# Patient Record
Sex: Male | Born: 1960
Health system: Southern US, Community
[De-identification: ages and names within clinical notes are randomized; demographics above are authoritative.]

## PROBLEM LIST (undated history)

## (undated) ENCOUNTER — Emergency Department: Discharge status: Absent without leave | Payer: Self-pay

## (undated) ENCOUNTER — Ambulatory Visit (INDEPENDENT_AMBULATORY_CARE_PROVIDER_SITE_OTHER): Admission: RE | Payer: Self-pay

## (undated) DIAGNOSIS — M1611 Unilateral primary osteoarthritis, right hip: Secondary | ICD-10-CM

## (undated) DIAGNOSIS — N429 Disorder of prostate, unspecified: Secondary | ICD-10-CM

## (undated) DIAGNOSIS — Z92241 Personal history of systemic steroid therapy: Secondary | ICD-10-CM

## (undated) HISTORY — DX: Personal history of systemic steroid therapy: Z92.241

## (undated) HISTORY — PX: ROTATOR CUFF REPAIR: SHX139

## (undated) HISTORY — DX: Unilateral primary osteoarthritis, right hip: M16.11

---

## 1988-02-14 HISTORY — PX: SPINAL FUSION: SHX223

## 1994-04-11 ENCOUNTER — Emergency Department: Admit: 1994-04-11 | Payer: Self-pay | Admitting: Emergency Medicine

## 1994-06-12 ENCOUNTER — Ambulatory Visit
Admit: 1994-06-12 | Disposition: A | Discharge status: Home / Self Care | Payer: Self-pay | Source: Ambulatory Visit | Admitting: Neurological Surgery

## 1994-06-29 ENCOUNTER — Ambulatory Visit: Admission: RE | Admit: 1994-06-29 | Payer: Self-pay | Source: Ambulatory Visit | Admitting: Neurological Surgery

## 1994-08-25 ENCOUNTER — Emergency Department: Admit: 1994-08-25 | Payer: Self-pay

## 1998-02-04 ENCOUNTER — Emergency Department: Admit: 1998-02-04 | Payer: Self-pay | Admitting: Emergency Medicine

## 2002-11-10 ENCOUNTER — Emergency Department: Admit: 2002-11-10 | Payer: Self-pay | Source: Emergency Department

## 2002-12-03 ENCOUNTER — Ambulatory Visit: Admission: RE | Admit: 2002-12-03 | Payer: Self-pay | Source: Ambulatory Visit | Admitting: Orthopaedic Surgery

## 2004-11-21 ENCOUNTER — Emergency Department: Admit: 2004-11-21 | Payer: Self-pay | Source: Emergency Department | Admitting: Emergency Medicine

## 2005-12-28 ENCOUNTER — Ambulatory Visit
Admit: 2005-12-28 | Disposition: A | Discharge status: Home / Self Care | Payer: Self-pay | Source: Ambulatory Visit | Admitting: Orthopaedic Surgery

## 2006-02-08 ENCOUNTER — Ambulatory Visit: Admission: RE | Admit: 2006-02-08 | Payer: Self-pay | Source: Ambulatory Visit | Admitting: Orthopaedic Surgery

## 2006-02-13 DIAGNOSIS — I10 Essential (primary) hypertension: Secondary | ICD-10-CM

## 2006-02-13 HISTORY — DX: Essential (primary) hypertension: I10

## 2006-05-10 ENCOUNTER — Ambulatory Visit: Admission: RE | Admit: 2006-05-10 | Payer: Self-pay | Source: Ambulatory Visit | Admitting: Orthopaedic Surgery

## 2006-06-08 ENCOUNTER — Ambulatory Visit: Admission: RE | Admit: 2006-06-08 | Payer: Self-pay | Source: Ambulatory Visit | Admitting: Orthopaedic Surgery

## 2007-05-20 ENCOUNTER — Emergency Department: Admit: 2007-05-20 | Payer: Self-pay | Source: Emergency Department | Admitting: Emergency Medicine

## 2008-08-19 ENCOUNTER — Ambulatory Visit
Admit: 2008-08-19 | Disposition: A | Discharge status: Home / Self Care | Payer: Self-pay | Source: Ambulatory Visit | Admitting: Orthopaedic Surgery

## 2008-08-20 ENCOUNTER — Emergency Department: Admit: 2008-08-20 | Payer: Self-pay | Source: Emergency Department | Admitting: Emergency Medicine

## 2008-08-20 LAB — URINALYSIS WITH MICROSCOPIC
Bilirubin, UA: NEGATIVE
Blood, UA: NEGATIVE
Glucose, UA: NEGATIVE
Ketones UA: NEGATIVE
Leukocyte Esterase, UA: NEGATIVE
Nitrite, UA: NEGATIVE
RBC, UA: 1 /HPF (ref 0–3)
Specific Gravity UA POCT: 1.021 (ref 1.001–1.035)
Squamous Epithelial Cells, Urine: <=1 /HPF
Urine pH: 6.5 (ref 5.0–8.0)
Urobilinogen, UA: NORMAL mg/dL
WBC, UA: 1 /HPF (ref 0–5)

## 2010-02-13 HISTORY — PX: SPLENECTOMY: SUR1306

## 2010-07-31 ENCOUNTER — Inpatient Hospital Stay
Admission: EM | Admit: 2010-07-31 | Discharge: 2010-08-05 | Disposition: A | Discharge status: Home / Self Care | Payer: Self-pay | Source: Emergency Department | Attending: Surgery | Admitting: Surgery

## 2010-07-31 LAB — CBC
Hematocrit: 43.7 % (ref 42.0–52.0)
Hgb: 15.2 g/dL (ref 13.0–17.0)
MCH: 31.9 pg (ref 28.0–32.0)
MCHC: 34.8 g/dL (ref 32.0–36.0)
MCV: 91.6 fL (ref 80.0–100.0)
MPV: 10 fL (ref 9.4–12.3)
Nucleated RBC: 0 /100 WBC
Platelets: 167 10*3/uL (ref 140–400)
RBC: 4.77 10*6/uL (ref 4.70–6.00)
RDW: 13 % (ref 12–15)
WBC: 25.78 10*3/uL — ABNORMAL HIGH (ref 3.50–10.80)

## 2010-07-31 LAB — HEMOGLOBIN AND HEMATOCRIT, BLOOD
Hematocrit: 42.1 % (ref 42.0–52.0)
Hematocrit: 39.3 % — ABNORMAL LOW (ref 42.0–52.0)
Hematocrit: 40.3 % — ABNORMAL LOW (ref 42.0–52.0)
Hgb: 14.4 g/dL (ref 13.0–17.0)
Hgb: 13.3 g/dL (ref 13.0–17.0)
Hgb: 13.4 g/dL (ref 13.0–17.0)

## 2010-07-31 LAB — BASIC METABOLIC PANEL
BUN: 25 mg/dL — ABNORMAL HIGH (ref 8–20)
CO2: 20 mEq/L — ABNORMAL LOW (ref 21–30)
Calcium: 8.3 mg/dL — ABNORMAL LOW (ref 8.6–10.2)
Chloride: 105 mEq/L (ref 98–107)
Creatinine: 1.3 mg/dL (ref 0.6–1.5)
Glucose: 101 mg/dL — ABNORMAL HIGH (ref 70–100)
Potassium: 4.6 mEq/L (ref 3.6–5.0)
Sodium: 139 mEq/L (ref 136–146)

## 2010-07-31 LAB — TYPE AND SCREEN
AB Screen Gel: NEGATIVE
ABO Rh: O POS

## 2010-07-31 LAB — GFR: EGFR: 59

## 2010-08-01 LAB — HEMOGLOBIN AND HEMATOCRIT, BLOOD
Hematocrit: 38.5 % — ABNORMAL LOW (ref 42.0–52.0)
Hematocrit: 36.7 % — ABNORMAL LOW (ref 42.0–52.0)
Hgb: 12.7 g/dL — ABNORMAL LOW (ref 13.0–17.0)
Hgb: 12 g/dL — ABNORMAL LOW (ref 13.0–17.0)

## 2010-08-01 LAB — MRSA CULTURE

## 2010-08-02 LAB — CBC
Hematocrit: 30.7 % — ABNORMAL LOW (ref 42.0–52.0)
Hgb: 10.2 g/dL — ABNORMAL LOW (ref 13.0–17.0)
MCH: 31.4 pg (ref 28.0–32.0)
MCHC: 33.2 g/dL (ref 32.0–36.0)
MCV: 94.5 fL (ref 80.0–100.0)
MPV: 10.2 fL (ref 9.4–12.3)
Nucleated RBC: 0 /100 WBC
Platelets: 93 10*3/uL — ABNORMAL LOW (ref 140–400)
RBC: 3.25 10*6/uL — ABNORMAL LOW (ref 4.70–6.00)
RDW: 13 % (ref 12–15)
WBC: 11.99 10*3/uL — ABNORMAL HIGH (ref 3.50–10.80)

## 2010-08-03 LAB — CBC
Hematocrit: 30.3 % — ABNORMAL LOW (ref 42.0–52.0)
Hgb: 10.2 g/dL — ABNORMAL LOW (ref 13.0–17.0)
MCH: 31.5 pg (ref 28.0–32.0)
MCHC: 33.7 g/dL (ref 32.0–36.0)
MCV: 93.5 fL (ref 80.0–100.0)
MPV: 10.2 fL (ref 9.4–12.3)
Nucleated RBC: 0 /100 WBC
Platelets: 100 10*3/uL — ABNORMAL LOW (ref 140–400)
RBC: 3.24 10*6/uL — ABNORMAL LOW (ref 4.70–6.00)
RDW: 13 % (ref 12–15)
WBC: 13.48 10*3/uL — ABNORMAL HIGH (ref 3.50–10.80)

## 2010-08-06 ENCOUNTER — Ambulatory Visit: Payer: Self-pay

## 2010-08-06 ENCOUNTER — Inpatient Hospital Stay
Admission: EM | Admit: 2010-08-06 | Discharge: 2010-08-12 | Disposition: A | Discharge status: Home / Self Care | Payer: Self-pay | Source: Emergency Department | Attending: Surgery | Admitting: Surgery

## 2010-08-06 LAB — CBC AND DIFFERENTIAL
Hematocrit: 26.6 % — ABNORMAL LOW (ref 42.0–52.0)
Hgb: 8.8 g/dL — ABNORMAL LOW (ref 13.0–17.0)
MCH: 31.3 pg (ref 28.0–32.0)
MCHC: 33.1 g/dL (ref 32.0–36.0)
MCV: 94.7 fL (ref 80.0–100.0)
MPV: 11.7 fL (ref 9.4–12.3)
Platelets: 215 10*3/uL (ref 140–400)
RBC: 2.81 10*6/uL — ABNORMAL LOW (ref 4.70–6.00)
RDW: 13 % (ref 12–15)
WBC: 34.47 10*3/uL — ABNORMAL HIGH (ref 3.50–10.80)

## 2010-08-06 LAB — I-STAT CG4 VENOUS CARTRIDGE
Lactic Acid I-Stat: 1.7 mEq/L (ref 0.5–2.2)
i-STAT Base Excess Venous: 2 mEq/L
i-STAT FIO2: 23
i-STAT HCO3 Bicarbonate Venous: 27.5 mEq/L
i-STAT O2 Saturation Venous: 44 %
i-STAT Patient Temperature: 95.1
i-STAT Total CO2 Venous: 29 mEq/L
i-STAT pCO2 Venous: 45.6 mmHg
i-STAT pH Venous: 7.38
i-STAT pO2 Venous: 23 mmHg

## 2010-08-06 LAB — CBC
Hematocrit: 20.5 % — ABNORMAL LOW (ref 42.0–52.0)
Hgb: 7.2 g/dL — ABNORMAL LOW (ref 13.0–17.0)
MCH: 32.3 pg — ABNORMAL HIGH (ref 28.0–32.0)
MCHC: 35.1 g/dL (ref 32.0–36.0)
MCV: 91.9 fL (ref 80.0–100.0)
MPV: 10.2 fL (ref 9.4–12.3)
Nucleated RBC: 0 /100 WBC
Platelets: 203 10*3/uL (ref 140–400)
RBC: 2.23 10*6/uL — ABNORMAL LOW (ref 4.70–6.00)
RDW: 13 % (ref 12–15)
WBC: 21.83 10*3/uL — ABNORMAL HIGH (ref 3.50–10.80)

## 2010-08-06 LAB — MAN DIFF ONLY
Band Neutrophils Absolute: 1.03 10*3/uL — ABNORMAL HIGH (ref 0.00–1.00)
Band Neutrophils: 3 % (ref 0–9)
Basophils Absolute Manual: 0 10*3/uL (ref 0.00–0.20)
Basophils Manual: 0 % (ref 0–2)
Eosinophils Absolute Manual: 0 10*3/uL (ref 0.00–0.70)
Eosinophils Manual: 0 % (ref 0–5)
Lymphocytes Absolute Manual: 0.69 10*3/uL (ref 0.50–4.40)
Lymphocytes Manual: 2 % — ABNORMAL LOW (ref 15–41)
Monocytes Absolute: 1.72 10*3/uL — ABNORMAL HIGH (ref 0.00–1.20)
Monocytes Manual: 5 % (ref 1–11)
Neutrophils Absolute Manual: 31.02 10*3/uL — ABNORMAL HIGH (ref 1.80–8.10)
Nucleated RBC: 0 /100 WBC
Segmented Neutrophils: 90 % — ABNORMAL HIGH (ref 52–75)

## 2010-08-06 LAB — RED BLOOD COUNT
RBC Leukoreduced: TRANSFUSED
Red Blood Cells: TRANSFUSED

## 2010-08-06 LAB — TYPE AND SCREEN
AB Screen Gel: NEGATIVE
ABO Rh: O POS

## 2010-08-06 LAB — CELL MORPHOLOGY: Cell Morphology: ABNORMAL — AB

## 2010-08-06 LAB — GFR: EGFR: 50

## 2010-08-06 LAB — PT AND APTT F/C
PT INR: 1.2 — ABNORMAL HIGH (ref 0.9–1.1)
PT: 15.1 s — ABNORMAL HIGH (ref 12.6–15.0)
PTT: 32 s (ref 23–37)

## 2010-08-06 LAB — BASIC METABOLIC PANEL
BUN: 25 mg/dL — ABNORMAL HIGH (ref 8–20)
CO2: 26 mEq/L (ref 21–30)
Calcium: 7.5 mg/dL — ABNORMAL LOW (ref 8.6–10.2)
Chloride: 98 mEq/L (ref 98–107)
Creatinine: 1.5 mg/dL (ref 0.6–1.5)
Glucose: 140 mg/dL — ABNORMAL HIGH (ref 70–100)
Potassium: 4.6 mEq/L (ref 3.6–5.0)
Sodium: 133 mEq/L — ABNORMAL LOW (ref 136–146)

## 2010-08-07 LAB — BASIC METABOLIC PANEL
BUN: 26 mg/dL — ABNORMAL HIGH (ref 8–20)
CO2: 24 mEq/L (ref 21–30)
Calcium: 7.1 mg/dL — ABNORMAL LOW (ref 8.6–10.2)
Chloride: 106 mEq/L (ref 98–107)
Creatinine: 1 mg/dL (ref 0.6–1.5)
Glucose: 101 mg/dL — ABNORMAL HIGH (ref 70–100)
Potassium: 4.9 mEq/L (ref 3.6–5.0)
Sodium: 134 mEq/L — ABNORMAL LOW (ref 136–146)

## 2010-08-07 LAB — CBC
Hematocrit: 30.7 % — ABNORMAL LOW (ref 42.0–52.0)
Hematocrit: 30.6 % — ABNORMAL LOW (ref 42.0–52.0)
Hematocrit: 30.4 % — ABNORMAL LOW (ref 42.0–52.0)
Hgb: 10.3 g/dL — ABNORMAL LOW (ref 13.0–17.0)
Hgb: 10.6 g/dL — ABNORMAL LOW (ref 13.0–17.0)
Hgb: 10.3 g/dL — ABNORMAL LOW (ref 13.0–17.0)
MCH: 30.4 pg (ref 28.0–32.0)
MCH: 30.3 pg (ref 28.0–32.0)
MCH: 29.9 pg (ref 28.0–32.0)
MCHC: 33.6 g/dL (ref 32.0–36.0)
MCHC: 34.6 g/dL (ref 32.0–36.0)
MCHC: 33.9 g/dL (ref 32.0–36.0)
MCV: 90.6 fL (ref 80.0–100.0)
MCV: 87.4 fL (ref 80.0–100.0)
MCV: 88.1 fL (ref 80.0–100.0)
MPV: 11.1 fL (ref 9.4–12.3)
MPV: 10.6 fL (ref 9.4–12.3)
MPV: 10.4 fL (ref 9.4–12.3)
Nucleated RBC: 0 /100 WBC
Nucleated RBC: 0 /100 WBC
Platelets: 204 10*3/uL (ref 140–400)
Platelets: 217 10*3/uL (ref 140–400)
Platelets: 269 10*3/uL (ref 140–400)
RBC: 3.39 10*6/uL — ABNORMAL LOW (ref 4.70–6.00)
RBC: 3.5 10*6/uL — ABNORMAL LOW (ref 4.70–6.00)
RBC: 3.45 10*6/uL — ABNORMAL LOW (ref 4.70–6.00)
RDW: 14 % (ref 12–15)
RDW: 15 % (ref 12–15)
RDW: 15 % (ref 12–15)
WBC: 33.94 10*3/uL — ABNORMAL HIGH (ref 3.50–10.80)
WBC: 25.66 10*3/uL — ABNORMAL HIGH (ref 3.50–10.80)
WBC: 26 10*3/uL — ABNORMAL HIGH (ref 3.50–10.80)

## 2010-08-07 LAB — PT AND APTT F/C
PT INR: 1.2 — ABNORMAL HIGH (ref 0.9–1.1)
PT: 15 s (ref 12.6–15.0)
PTT: 30 s (ref 23–37)

## 2010-08-07 LAB — PHOSPHORUS: Phosphorus: 3.8 mg/dL (ref 2.5–4.5)

## 2010-08-07 LAB — MAGNESIUM: Magnesium: 1.9 mg/dL (ref 1.6–2.3)

## 2010-08-07 LAB — GFR: EGFR: >60

## 2010-08-07 LAB — MRSA CULTURE

## 2010-08-08 LAB — RED BLOOD CELLS OR HOLD

## 2010-08-08 LAB — CBC
Hematocrit: 27.7 % — ABNORMAL LOW (ref 42.0–52.0)
Hgb: 9.4 g/dL — ABNORMAL LOW (ref 13.0–17.0)
MCH: 30.3 pg (ref 28.0–32.0)
MCHC: 33.9 g/dL (ref 32.0–36.0)
MCV: 89.4 fL (ref 80.0–100.0)
MPV: 10.4 fL (ref 9.4–12.3)
Nucleated RBC: 1 /100 WBC
Platelets: 340 10*3/uL (ref 140–400)
RBC: 3.1 10*6/uL — ABNORMAL LOW (ref 4.70–6.00)
RDW: 15 % (ref 12–15)
WBC: 27.75 10*3/uL — ABNORMAL HIGH (ref 3.50–10.80)

## 2010-08-08 LAB — BASIC METABOLIC PANEL
BUN: 23 mg/dL — ABNORMAL HIGH (ref 8–20)
CO2: 27 mEq/L (ref 21–30)
Calcium: 7.4 mg/dL — ABNORMAL LOW (ref 8.6–10.2)
Chloride: 103 mEq/L (ref 98–107)
Creatinine: 0.8 mg/dL (ref 0.6–1.5)
Glucose: 102 mg/dL — ABNORMAL HIGH (ref 70–100)
Potassium: 4.4 mEq/L (ref 3.6–5.0)
Sodium: 135 mEq/L — ABNORMAL LOW (ref 136–146)

## 2010-08-08 LAB — GFR: EGFR: >60

## 2010-08-09 LAB — BASIC METABOLIC PANEL
BUN: 22 mg/dL — ABNORMAL HIGH (ref 8–20)
CO2: 29 mEq/L (ref 21–30)
Calcium: 7.6 mg/dL — ABNORMAL LOW (ref 8.6–10.2)
Chloride: 104 mEq/L (ref 98–107)
Creatinine: 0.9 mg/dL (ref 0.6–1.5)
Glucose: 83 mg/dL (ref 70–100)
Potassium: 4.4 mEq/L (ref 3.6–5.0)
Sodium: 139 mEq/L (ref 136–146)

## 2010-08-09 LAB — MAGNESIUM: Magnesium: 2.3 mg/dL (ref 1.6–2.3)

## 2010-08-09 LAB — CBC
Hematocrit: 27.4 % — ABNORMAL LOW (ref 42.0–52.0)
Hgb: 9 g/dL — ABNORMAL LOW (ref 13.0–17.0)
MCH: 30.3 pg (ref 28.0–32.0)
MCHC: 32.8 g/dL (ref 32.0–36.0)
MCV: 92.3 fL (ref 80.0–100.0)
MPV: 10 fL (ref 9.4–12.3)
Nucleated RBC: 0 /100 WBC
Platelets: 482 10*3/uL — ABNORMAL HIGH (ref 140–400)
RBC: 2.97 10*6/uL — ABNORMAL LOW (ref 4.70–6.00)
RDW: 15 % (ref 12–15)
WBC: 24.71 10*3/uL — ABNORMAL HIGH (ref 3.50–10.80)

## 2010-08-09 LAB — PHOSPHORUS: Phosphorus: 3.5 mg/dL (ref 2.5–4.5)

## 2010-08-09 LAB — GFR: EGFR: >60

## 2010-08-10 LAB — CBC
Hematocrit: 26.5 % — ABNORMAL LOW (ref 42.0–52.0)
Hgb: 8.7 g/dL — ABNORMAL LOW (ref 13.0–17.0)
MCH: 30.1 pg (ref 28.0–32.0)
MCHC: 32.8 g/dL (ref 32.0–36.0)
MCV: 91.7 fL (ref 80.0–100.0)
MPV: 9.9 fL (ref 9.4–12.3)
Nucleated RBC: 1 /100 WBC
Platelets: 615 10*3/uL — ABNORMAL HIGH (ref 140–400)
RBC: 2.89 10*6/uL — ABNORMAL LOW (ref 4.70–6.00)
RDW: 15 % (ref 12–15)
WBC: 20.59 10*3/uL — ABNORMAL HIGH (ref 3.50–10.80)

## 2010-08-10 LAB — BASIC METABOLIC PANEL
BUN: 15 mg/dL (ref 8–20)
CO2: 25 mEq/L (ref 21–30)
Calcium: 7.7 mg/dL — ABNORMAL LOW (ref 8.6–10.2)
Chloride: 104 mEq/L (ref 98–107)
Creatinine: 0.9 mg/dL (ref 0.6–1.5)
Glucose: 88 mg/dL (ref 70–100)
Potassium: 4.6 mEq/L (ref 3.6–5.0)
Sodium: 137 mEq/L (ref 136–146)

## 2010-08-10 LAB — GFR: EGFR: >60

## 2010-08-15 ENCOUNTER — Emergency Department
Admit: 2010-08-15 | Disposition: A | Discharge status: Home / Self Care | Payer: Self-pay | Source: Emergency Department | Admitting: Emergency Medicine

## 2010-08-25 ENCOUNTER — Ambulatory Visit (INDEPENDENT_AMBULATORY_CARE_PROVIDER_SITE_OTHER): Admit: 2010-08-25 | Discharge: 2010-08-25 | Payer: Self-pay | Source: Ambulatory Visit

## 2010-12-01 NOTE — Op Note (Unsigned)
DATE OF BIRTH:                        13-May-1960      ADMISSION DATE:                     06/08/2006            PATIENT LOCATION:                     [id number]            DATE OF PROCEDURE:                   06/08/2006      SURGEON:                            Daniel Smiles, MD      ASSISTANT(S):                         Jens Som, PA                  PREOPERATIVE DIAGNOSIS:  FAILED ROTATOR CUFF REPAIR WITH INFECTION DUE TO      NEW INJURY.  ADHESIVE CAPSULITIS, RIGHT SHOULDER.  ABSCESS, RIGHT      SHOULDER.            POSTOPERATIVE DIAGNOSIS:  FAILED ROTATOR CUFF REPAIR WITH INFECTION DUE TO      NEW INJURY.  ADHESIVE CAPSULITIS, RIGHT SHOULDER.  ABSCESS, RIGHT      SHOULDER.            PROCEDURE:  INCISION, DRAINAGE, AND MANIPULATION, RIGHT SHOULDER.      DEBRIDEMENT REPAIR OF ROTATOR CUFF, RIGHT SHOULDER.            INDICATIONS FOR PROCEDURE:  Prior to the procedure the risks, benefits, and      alternatives had been discussed.  The patient understood that his repeat      injury while using a chain saw and working in the woods within one week of      his initial procedure caused another injury, failure of his repair, abscess      formation, and hematoma formation.  A very careful discussion with his      girlfriend reveals that this gentleman may have bipolar disorder.  This may      be the reason for his being so aggressive with his motion.  This was a new      injury causing failure of the repair done.  The details are outlined      below.            [name] assisted through all aspects of the procedure.  His help      was absolutely mandatory and greatly appreciated.  He was a help with      positioning the patient, holding the arm, and helping with repair.  It      would have been impossible to achieve a successful outcome without his      help.            DESCRIPTION OF PROCEDURE:  The patient was placed supine on the operating      table.  The right shoulder was noted to be extremely stiff.   Manipulation      revealed audible and palpable lysis of  adhesions.            The right shoulder was approached through an anterior incision site.  Upon      entering the skin, a large amount of pustular drainage was identified.      This went all the way down into the shoulder joint.            This had been drained several times in the office and it reaccumulated.      The repair of the layer of the deltoid muscle had failed due to a large      hematoma and pus formation, as did the repair of the rotator cuff itself.      All sutures were broken and had failed and pulled free.            These were all debrided and copious amounts of pustular material were      cleaned from the shoulder.  This was presently treated by antibiotics.      Several deep cultures were sent.  The wounds were copiously irrigated with      multiple amounts of irrigation and Bacitracin using 3 liters.  All areas      were debrided to a clean surface.            The rotator cuff was torn free.  It was felt that repair of the cuff would      be prudent as it was retracting.  A drill hole was placed in the humerus.      All areas appeared clear.  New instruments were utilized.  Vertical      mattress sutures were used after using a 6.5 absorbable anchor to      reapproximate the shoulder.  A nice repair was obtained again.  This was      held against a clean debrided bed.  The biceps tendon had not lost its      position.            Multiple sutures were not placed in the biceps on this occasion as it was      felt this was stable enough to stay.  This could be compromised as the      repair may be compromised, as the family was advised.  The wounds were      copiously irrigated and reapproximated with #1 Vicryl suture.  Then Vicryl      and 2-0 nylon suture were placed in the skin.  The patient was very      carefully advised to be compliant.            The girlfriend was counseled extensively; she lives with the patient.  She      states he  has probable bipolar disorder.  He had sought medical help last      evening.  Compliance is key.  She is advised to contact psychiatry      immediately for followup.  She will make further arrangements.  This has      already been started at the level of the medical physician she sought help      with last evening.  She understands the possible complications and      potential failure of this procedure should he be noncompliant.  There is a      point where even with everything being done correctly he could fail and      lose function of his shoulder.  This could  even be life-threatening. The      result could be a nonfunctioning shoulder that would neither either fusion      or replacement.  Certainly his lifting would be significantly compromised.      He is advised to be careful for at least 3 months and hopefully he will      follow this advice.            The patient's family will help coordinate this.  He will also follow up      with his medical physician over the weekend.  He was given steroid coverage      because of his use of anabolic steroids.  This may also complicate the      issue in terms of healing.                                          ___________________________________          Date Signed: __________      Daniel Smiles, MD  ([id number])            D: 06/08/2006 by Daniel Smiles, MD      T: 06/10/2006 by [name] ([id number]) ([id number])      cc:  Daniel Smiles, MD

## 2010-12-01 NOTE — Op Note (Unsigned)
DATE OF BIRTH:                        Oct 11, 1960      ADMISSION DATE:                     05/10/2006            PATIENT LOCATION:                     [id number]            DATE OF PROCEDURE:                   05/10/2006      SURGEON:                            Glynis Smiles, MD      ASSISTANT(S):                  PREOPERATIVE DIAGNOSIS:  INTERNAL DERANGEMENT OF THE RIGHT SHOULDER WITH      IMPINGEMENT SYNDROME, ANTERIOR CRUCIATE ARTHRITIS, ROTATOR CUFF TEAR,      BICEPS TENDONITIS RIGHT SHOULDER.            POSTOPERATIVE DIAGNOSIS:  INTERNAL DERANGEMENT OF THE RIGHT SHOULDER WITH      IMPINGEMENT SYNDROME, ANTERIOR CRUCIATE ARTHRITIS, ROTATOR CUFF TEAR,      BICEPS TENDONITIS RIGHT SHOULDER.            PROCEDURE:  MANIPULATION FOR ADHESIVE CAPSULITIS, ACROMIOPLASTY, PARTIAL      CORTICAL RESECTION, DEBRIDEMENT AND REPAIR OF ROTATOR CUFF, BICEPS      TENODESIS RIGHT SHOULDER.            NOTE:  Prior to the procedure the risks, benefits and alternative had been      discussed with the patient.  The patient had suffered rupture of his right      pectoralis muscle in the past.  This was being treated conservatively      without repair.  He is very active, lifting and is a poorly compliant      patient that has been advised as to the need for care, especially with this      procedure.  Understanding that the pectoralis will not be repaired, the      rotator cuff will be repaired as outlined below.            Mr. Casimiro Needle Early assisted through all aspects of the procedure.  His help      was absolutely mandatory and greatly appreciated. He helped to allow this      to be done minimally invasive approach.  Details are outlined below.            DESCRIPTION OF PROCEDURE:  The patient was placed in the semisitting      position with the right shoulder prepped and draped free.  Range of motion      of the shoulder revealed adhesive capsulitis in the extreme of abduction      and external rotation and manipulation revealed  restoration of motion.            Incision was made over the [facility] joint.  Dissection was carried down to an      obvious AC separation with meniscal pathology and erosion of the distal      clavicle.  More than 1  cm of the distal clavicle was resected with an air      saw.            Severe impingement was noted at the level of the acromion on the right      side.  It was difficult to even pass a malleable retractor under the      acromion.  A large bursa was released upon entering into this region.  An      electrocautery unit was used to release soft tissues and air saw used to      decompress and perform acromioplasty which was extensive.            After smoothing the areas and irrigating, bone wax was placed onto the      bone.            A very large rotator cuff tear was identified.  Initially it was felt that      an anchor may be necessary but end-to-end restoration was achievable.  The      biceps tendon was also present in the tear and was tenodesed using multiple      figure-of-eight #1 Ethibond sutures for the repair and the tenodesis.  Once      all was nicely restored and water tight closure effected, the wounds were      irrigated and closed with figure-of-eight #1 Vicryl suture, inverted      interrupted 0 and #1 Vicryl suture and 3-0 Vicryl subcuticular suture with      Mastisol and Steri-Strips.  The shoulder was injected with 20 mL 0.5%      Marcaine with epinephrine.  No complications encountered.                                          ___________________________________          Date Signed: __________      Glynis Smiles, MD  ([id number])            D: 05/10/2006 by Glynis Smiles, MD      T: 05/11/2006 by [id number] ([id number]) ([id number])      cc:  Glynis Smiles, MD

## 2010-12-01 NOTE — Op Note (Unsigned)
DATE OF BIRTH:                        Feb 11, 1961      ADMISSION DATE:                     02/08/2006            PATIENT LOCATION:                     [id number]            DATE OF PROCEDURE:                   02/08/2006      SURGEON:                            Glynis Smiles, MD      ASSISTANT(S):                         Jens Som, PA                  PREOPERATIVE DIAGNOSES:  IMPINGEMENT SYNDROME, ADHESIVE CAPSULITIS,      ACROMIOCLAVICULAR ARTHRITIS, ROTATOR CUFF TEAR, BICEPS TENDINITIS, LEFT      SHOULDER.            POSTOPERATIVE DIAGNOSES:  IMPINGEMENT SYNDROME, ADHESIVE CAPSULITIS,      ACROMIOCLAVICULAR ARTHRITIS, ROTATOR CUFF TEAR, BICEPS TENDINITIS, LEFT      SHOULDER.            PROCEDURES:  MANIPULATION UNDER ANESTHESIA, ARTHROTOMY, RESECTION DISTAL      CLAVICLE, ACROMIOPLASTY, DEBRIDEMENT, REPAIR OF ROTATOR CUFF, BICEPS      TENODESIS, LEFT SHOULDER.            INDICATIONS:   Prior to the procedure, the risks, benefits and alternatives      had been discussed extensively with this patient.  Initially, he was      approaching his right shoulder as it has similar findings, but his left      shoulder got so much worse that we proceeded.  This actually was indicative      of the identified extensive rotator cuff tear outlined below.            [name] assisted through all aspects of the procedure.  It was a      mini open procedure and his help was absolutely mandatory and greatly      appreciated to achieve a successful outcome.  Details are outlined below.      He helped through all phases of the procedure.            DESCRIPTION OF PROCEDURE:  The patient was placed in the semi-sitting      position and prepped and draped free using Betadine scrub and paint.      Anesthesia covered him with appropriate coverage with antibiotics etc.      Details are found in their summary.            The left shoulder was manipulated under general anesthesia and palpable and      audible lysis of adhesions were  noted.  Full motion was restored.            Arthrotomy was performed over the [facility] joint.  Marked arthritis and      destructive  changes in the distal clavicle were identified.  More than 1 cm      was resected with an air saw and smoothed with a rasp.            Electrocautery was used to control bleeding.  Soft tissues were elevated      superiorly and anteriorly and a significant impingement was noted on the      acromion, impinging into the rotator cuff.  A large portion of the acromion      was removed with an air saw and smoothed with a rasp.  This was debrided      extensively and caused significant impingement.  A large bursa was entered      and debrided.  A large rotator cuff tear was identified and biceps tendon      was involved along its margin with some calcific changes.  This was      incorporated in the repair and multiple sutures placed to help hold it in      its groove.  This still may rupture distal to this and the patient      understands this.            The edges were debrided and multiple sutures placed.  Anchors were not      necessary and multiple sutures provided a closure of the cuff.  It appeared      to be reasonably stable with range of motion.  Wounds were irrigated and      closed using #1 figure-of-eight Vicryl suture for anterior soft tissues.      Inverted, interrupted #1 0-Vicryl, 2-0 Vicryl and 3-0 Vicryl for layers and      closed up the subcuticular closure with 3-0 Vicryl.  The shoulder was      injected with 20 mL of 0.5% Marcaine with epinephrine.  A dry dressing      applied.  Steri-Strips applied.  The patient returned to the recovery room      in stable condition without complication.                                                ___________________________________          Date Signed: __________      Glynis Smiles, MD  ([id number])            D: 02/08/2006 by Glynis Smiles, MD      T: 02/09/2006 by [id number] ([id number]) ([id number])      cc:  Glynis Smiles, MD
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
## 2012-04-10 ENCOUNTER — Encounter (INDEPENDENT_AMBULATORY_CARE_PROVIDER_SITE_OTHER): Payer: Self-pay

## 2012-07-25 ENCOUNTER — Emergency Department: Payer: No Typology Code available for payment source

## 2012-07-25 ENCOUNTER — Emergency Department: Payer: BC Managed Care – PPO

## 2012-07-25 ENCOUNTER — Emergency Department
Admission: EM | Admit: 2012-07-25 | Discharge: 2012-07-25 | Disposition: A | Discharge status: Home / Self Care | Payer: No Typology Code available for payment source | Attending: Emergency Medicine | Admitting: Emergency Medicine

## 2012-07-25 DIAGNOSIS — I10 Essential (primary) hypertension: Secondary | ICD-10-CM | POA: Insufficient documentation

## 2012-07-25 DIAGNOSIS — S8010XA Contusion of unspecified lower leg, initial encounter: Secondary | ICD-10-CM | POA: Insufficient documentation

## 2012-07-25 MED ORDER — OXYCODONE-ACETAMINOPHEN 5-325 MG PO TABS
1.0000 | ORAL_TABLET | Freq: Once | ORAL | Status: AC
Start: 2012-07-25 — End: 2012-07-25
  Administered 2012-07-25: 1 via ORAL
  Filled 2012-07-25: qty 1

## 2012-07-25 MED ORDER — OXYCODONE-ACETAMINOPHEN 5-325 MG PO TABS
ORAL_TABLET | ORAL | Status: DC
Start: 2012-07-25 — End: 2012-07-30

## 2012-07-25 NOTE — Discharge Instructions (Signed)
Hematoma    You have been diagnosed with a hematoma.    A hematoma is a collection of blood under the skin usually caused by injury to the area. In other words, it is a very large bruise.    Use ice to the area 15 minutes out of every hour to help with swelling and pain. By applying ice to the affected area, swelling and pain can be reduced. Place some ice cubes in a resealable (Ziploc) bag and add some water. Put a thin washcloth between the bag and the skin. Apply the ice bag to the area for at least 20 minutes. Do this at least 4 times per day. Longer times and more frequently are OK. NEVER APPLY ICE DIRECTLY TO THE SKIN! If your injury is on your hand or foot, elevate it above the level of your heart to help with swelling.    YOU SHOULD SEEK MEDICAL ATTENTION IMMEDIATELY, EITHER HERE OR AT THE NEAREST EMERGENCY DEPARTMENT, IF ANY OF THE FOLLOWING OCCURS:   The hematoma continues to get bigger.   Fever (temperature of over 100.5 F.) or shaking chills.   Redness that surrounds or spreads outward from the area.   Breakdown of the skin in the area affected.   Foul drainage from the injured area.
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
## 2012-07-30 ENCOUNTER — Emergency Department: Payer: No Typology Code available for payment source

## 2012-07-30 ENCOUNTER — Inpatient Hospital Stay
Admission: RE | Admit: 2012-07-30 | Discharge: 2012-08-03 | DRG: 603 | Disposition: A | Discharge status: Home / Self Care | Payer: No Typology Code available for payment source | Source: Ambulatory Visit | Attending: Internal Medicine | Admitting: Internal Medicine

## 2012-07-30 ENCOUNTER — Inpatient Hospital Stay: Payer: No Typology Code available for payment source | Admitting: Internal Medicine

## 2012-07-30 ENCOUNTER — Emergency Department
Admission: EM | Admit: 2012-07-30 | Discharge: 2012-07-30 | Disposition: A | Discharge status: Other Acute Inpatient Hospital | Payer: No Typology Code available for payment source | Source: Home / Self Care | Attending: Emergency Medicine | Admitting: Emergency Medicine

## 2012-07-30 DIAGNOSIS — Z79899 Other long term (current) drug therapy: Secondary | ICD-10-CM

## 2012-07-30 DIAGNOSIS — S8010XA Contusion of unspecified lower leg, initial encounter: Secondary | ICD-10-CM | POA: Diagnosis present

## 2012-07-30 DIAGNOSIS — I10 Essential (primary) hypertension: Secondary | ICD-10-CM | POA: Diagnosis present

## 2012-07-30 DIAGNOSIS — Z981 Arthrodesis status: Secondary | ICD-10-CM

## 2012-07-30 DIAGNOSIS — L02419 Cutaneous abscess of limb, unspecified: Principal | ICD-10-CM | POA: Diagnosis present

## 2012-07-30 DIAGNOSIS — F17200 Nicotine dependence, unspecified, uncomplicated: Secondary | ICD-10-CM | POA: Diagnosis present

## 2012-07-30 LAB — CBC AND DIFFERENTIAL
Basophils Absolute Automated: 0.04 (ref 0.00–0.20)
Basophils Automated: 0 %
Eosinophils Absolute Automated: 0.23 (ref 0.00–0.70)
Eosinophils Automated: 2 %
Hematocrit: 47.1 % (ref 42.0–52.0)
Hgb: 15.8 g/dL (ref 13.0–17.0)
Lymphocytes Absolute Automated: 1.37 (ref 0.50–4.40)
Lymphocytes Automated: 12 %
MCH: 32.4 pg — ABNORMAL HIGH (ref 28.0–32.0)
MCHC: 33.5 g/dL (ref 32.0–36.0)
MCV: 96.5 fL (ref 80.0–100.0)
MPV: 9.8 fL (ref 9.4–12.3)
Monocytes Absolute Automated: 1.12 (ref 0.00–1.20)
Monocytes: 10 %
Neutrophils Absolute: 8.26 — ABNORMAL HIGH (ref 1.80–8.10)
Neutrophils: 75 %
Platelets: 294 (ref 140–400)
RBC: 4.88 (ref 4.70–6.00)
RDW: 16 % — ABNORMAL HIGH (ref 12–15)
WBC: 11.02 — ABNORMAL HIGH (ref 3.50–10.80)

## 2012-07-30 LAB — BASIC METABOLIC PANEL
Anion Gap: 10 (ref 5.0–15.0)
BUN: 14 mg/dL (ref 9.0–21.0)
CO2: 25 (ref 22–29)
Calcium: 9.3 mg/dL (ref 8.5–10.5)
Chloride: 101 (ref 98–107)
Creatinine: 1.2 mg/dL (ref 0.7–1.3)
Glucose: 92 mg/dL (ref 70–100)
Potassium: 4.2 (ref 3.5–5.1)
Sodium: 136 (ref 136–145)

## 2012-07-30 LAB — GFR: EGFR: >60

## 2012-07-30 MED ORDER — VANCOMYCIN HCL 1000 MG IV SOLR
1500.0000 mg | Freq: Two times a day (BID) | INTRAVENOUS | Status: DC
Start: 2012-07-31 — End: 2012-08-03
  Administered 2012-07-31 – 2012-08-03 (×7): 1500 mg via INTRAVENOUS
  Filled 2012-07-30 (×8): qty 1500

## 2012-07-30 MED ORDER — DOCUSATE SODIUM 100 MG PO CAPS
100.0000 mg | ORAL_CAPSULE | Freq: Two times a day (BID) | ORAL | Status: DC | PRN
Start: 2012-07-30 — End: 2012-08-03
  Administered 2012-07-30: 100 mg via ORAL
  Filled 2012-07-30: qty 1

## 2012-07-30 MED ORDER — SENNA 8.6 MG PO TABS
17.20 mg | ORAL_TABLET | Freq: Every day | ORAL | Status: DC | PRN
Start: 2012-07-30 — End: 2012-08-03

## 2012-07-30 MED ORDER — VANCOMYCIN HCL 1000 MG IV SOLR
1000.0000 mg | Freq: Once | INTRAVENOUS | Status: AC
Start: 2012-07-30 — End: 2012-07-30
  Administered 2012-07-30: 1000 mg via INTRAVENOUS
  Filled 2012-07-30: qty 1000

## 2012-07-30 MED ORDER — SODIUM CHLORIDE 0.9 % IV BOLUS
1000.00 mL | Freq: Once | INTRAVENOUS | Status: AC
Start: 2012-07-30 — End: 2012-07-30
  Administered 2012-07-30: 1000 mL via INTRAVENOUS

## 2012-07-30 MED ORDER — CIPROFLOXACIN HCL 500 MG PO TABS
500.0000 mg | ORAL_TABLET | Freq: Two times a day (BID) | ORAL | Status: DC
Start: 2012-07-30 — End: 2012-07-30

## 2012-07-30 MED ORDER — OXYCODONE-ACETAMINOPHEN 5-325 MG PO TABS
1.0000 | ORAL_TABLET | Freq: Four times a day (QID) | ORAL | Status: DC | PRN
Start: 2012-07-30 — End: 2012-08-03
  Administered 2012-07-30 – 2012-08-01 (×3): 1 via ORAL
  Filled 2012-07-30 (×4): qty 1

## 2012-07-30 MED ORDER — ONDANSETRON HCL 4 MG/2ML IJ SOLN
4.00 mg | Freq: Once | INTRAMUSCULAR | Status: AC
Start: 2012-07-30 — End: 2012-07-30
  Administered 2012-07-30: 4 mg via INTRAVENOUS
  Filled 2012-07-30: qty 2

## 2012-07-30 MED ORDER — VALSARTAN 160 MG PO TABS
ORAL_TABLET | Freq: Every day | ORAL | Status: DC
Start: 2012-07-31 — End: 2012-08-03
  Filled 2012-07-30 (×6): qty 1

## 2012-07-30 MED ORDER — ACETAMINOPHEN 325 MG PO TABS
650.0000 mg | ORAL_TABLET | Freq: Four times a day (QID) | ORAL | Status: DC | PRN
Start: 2012-07-30 — End: 2012-08-03
  Administered 2012-08-02 – 2012-08-03 (×2): 650 mg via ORAL
  Filled 2012-07-30 (×2): qty 2

## 2012-07-30 MED ORDER — SODIUM CHLORIDE 0.9 % IV MBP
4.5000 g | Freq: Once | INTRAVENOUS | Status: DC
Start: 2012-07-30 — End: 2012-07-30

## 2012-07-30 MED ORDER — KETOROLAC TROMETHAMINE 30 MG/ML IJ SOLN
30.00 mg | Freq: Once | INTRAMUSCULAR | Status: AC
Start: 2012-07-30 — End: 2012-07-30
  Administered 2012-07-30: 30 mg via INTRAVENOUS
  Filled 2012-07-30: qty 1

## 2012-07-30 MED ORDER — HYDROMORPHONE HCL PF 1 MG/ML IJ SOLN
1.0000 mg | INTRAMUSCULAR | Status: DC | PRN
Start: 2012-07-30 — End: 2012-08-03
  Administered 2012-07-30 – 2012-08-03 (×12): 1 mg via INTRAVENOUS
  Filled 2012-07-30 (×12): qty 1

## 2012-07-30 MED ORDER — ONDANSETRON HCL 4 MG/2ML IJ SOLN
4.0000 mg | Freq: Four times a day (QID) | INTRAMUSCULAR | Status: DC | PRN
Start: 2012-07-30 — End: 2012-08-03

## 2012-07-30 MED ORDER — SODIUM CHLORIDE 0.9 % IV MBP
2.00 g | INTRAVENOUS | Status: DC
Start: 2012-07-30 — End: 2012-08-03
  Administered 2012-07-30 – 2012-08-02 (×4): 2 g via INTRAVENOUS
  Filled 2012-07-30 (×5): qty 2000

## 2012-07-30 MED ORDER — VANCOMYCIN HCL 1000 MG IV SOLR
1000.00 mg | Freq: Once | INTRAVENOUS | Status: AC
Start: 2012-07-30 — End: 2012-07-30
  Administered 2012-07-30: 1000 mg via INTRAVENOUS
  Filled 2012-07-30: qty 1000

## 2012-07-30 MED ORDER — POLYETHYLENE GLYCOL 3350 17 G PO PACK
17.00 g | PACK | Freq: Every day | ORAL | Status: DC | PRN
Start: 2012-07-30 — End: 2012-08-03

## 2012-07-30 MED ORDER — HYDROMORPHONE HCL PF 1 MG/ML IJ SOLN
0.50 mg | Freq: Once | INTRAMUSCULAR | Status: AC
Start: 2012-07-30 — End: 2012-07-30
  Administered 2012-07-30: 0.5 mg via INTRAVENOUS
  Filled 2012-07-30: qty 1

## 2012-07-30 MED ORDER — ENOXAPARIN SODIUM 40 MG/0.4ML SC SOLN
40.0000 mg | Freq: Every day | SUBCUTANEOUS | Status: DC
Start: 2012-07-30 — End: 2012-08-03
  Administered 2012-07-30 – 2012-08-03 (×5): 40 mg via SUBCUTANEOUS
  Filled 2012-07-30 (×5): qty 0.4

## 2012-07-30 NOTE — Treatment Plan (Signed)
Infectious Diseases & Tropical  Medicine  Full Consult Dictated        Full consult dictated: # [id number]    Assessment:    Hypertension   LLE trauma   Likely hematoma    Cellulitis       Plan:   Start vancomycin and ceftriaxone    Pharmacy to monitor vancomycin level   Orthopedic evaluation   Elevation   Monitor clinically    Thanks for the consultation          Evaluna Utke A. Janalyn Rouse, MD   07/30/2012

## 2012-07-30 NOTE — Treatment Plan (Signed)
VTE/PE Risk Screening  Complete Upon Admission and Transfer to Different Level of Care  Completed by nurse: Donnetta Hutching 07/30/2012 8:23 PM   -----------------------------------------------------------------------------------------------------------  SECTION 1 - Risk Screening     [x]   Patient currently receiving anticoagulation therapy (Heparin, Lovenox, Coumadin, Pradaxa, Xarelto, or Arixtra Only) and received 1 dose within 24 hours of admission STOP HERE   []   VTE/PE prophylaxis currently prescribed elsewhere - STOP HERE   []   Comfort Care - STOP HERE   []   Clinical Trials - STOP HERE    Contraindications: Patients with a history of the following conditions cannot haveSequential compression devices (SCD     []  Any of these conditions present , Call MD for pharmacological prophylaxis or ask MD to document reason for not having both mechanical and pharmacologic VTE prophylaxis   []  Post-op vein ligation   []  Suspected VTE   []  Cellulitis/Dermatitis of the leg   []  Severe ischemic Vascular disease   []  Edema related to Congestive Heart Faliure   []  Gangrene   []  Recent skin graft  -----------------------------------------------------------------------------------------------------------  SECTION 2 - Risk Factors (Check all that apply)    Moderate Risk Factors   []   Heart Failure (current or history of)   []   Respiratory Failure   []   Acute Myocardial Infarction (AMI)   []   Acute Infection   []   Rheumatologic Disorder   []   Elderly age (52 years old)   []   Ongoing hormonal treatment / estrogen use (including Tamoxifen, Raloxifene)   []   Obesity (BMI >/= 30kg/m2)    High Risk Factors   []   Recent (</= 1 month) trauma/surgery    Highest Risk Factors   []   Active Cancer   []   Previous VTE   []   Reduced mobility (>24 hrs; current or anticipated)   []   Known thrombophilic condition (hematological disorders that promote thrombosis)      []   No boxes checked in this section indicate patient is at low risk for VTE. No  VTE Prophylaxis indicated.      [x]   One or more risk factors present, enter an EPIC order for Sequential compression devices (SCD). Use per protocol, MD signature required.

## 2012-07-30 NOTE — Plan of Care (Signed)
A 52 year old Caucasian male [facility] with Dx of Cellulitis of LLE. A&Ox4, VS stable, C/o edema and redness to left shin area, elevated on pillows. Pt oriented to his room, call light and bed table within reach. IV ABX infusing per order, dinner tray offered, ate 100%, Percocet 1 tab offered for pain 98f 7/10 with some relief. Will cont to moitor and follow plan of care.
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
## 2012-07-30 NOTE — H&P (Signed)
Full note dictated #[id number]    Assessment :  Principal Problem:   *Cellulitis of left leg  Active Problems:   Hematoma   HTN (hypertension)        Plan:  IV abx   ID and ortho consulted  Resume home meds      Christophe Louis, DO
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
## 2012-07-30 NOTE — Progress Notes (Signed)
Pharmacy Note: Vancomycin Dosing     SCr 1.2; Est CrCl >100 ml/min    Tmax: 97.3 F (36.3 C) (Oral)     Vancomycin Indication: cellulitis  Vancomycin Target Trough Level: 15-20 mg/L     Vancomycin Dosing:     Initial loading dose (20 mg/kg) =  1g x 2 (total 2g) 6/17 PM  Maintenance regimen (15 mg/kg) = 1500 mg Q 12 hr     Vancomycin level:  Trough ordered prior to 4th dose - 6/19 @0430     Thank you    Norwood Levo, PharmD, BCPS  [phone]
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
## 2012-07-31 LAB — CBC AND DIFFERENTIAL
Basophils Absolute Automated: 0.02 (ref 0.00–0.20)
Basophils Automated: 0 %
Eosinophils Absolute Automated: 0.64 (ref 0.00–0.70)
Eosinophils Automated: 7 %
Hematocrit: 45 % (ref 42.0–52.0)
Hgb: 15.1 g/dL (ref 13.0–17.0)
Immature Granulocytes Absolute: 0.03
Immature Granulocytes: 0 %
Lymphocytes Absolute Automated: 1.93 (ref 0.50–4.40)
Lymphocytes Automated: 22 %
MCH: 32.7 pg — ABNORMAL HIGH (ref 28.0–32.0)
MCHC: 33.6 g/dL (ref 32.0–36.0)
MCV: 97.4 fL (ref 80.0–100.0)
MPV: 10.7 fL (ref 9.4–12.3)
Monocytes Absolute Automated: 1.3 — ABNORMAL HIGH (ref 0.00–1.20)
Monocytes: 15 %
Neutrophils Absolute: 4.93 (ref 1.80–8.10)
Neutrophils: 56 %
Nucleated RBC: 0 (ref 0–1)
Platelets: 301 (ref 140–400)
RBC: 4.62 — ABNORMAL LOW (ref 4.70–6.00)
RDW: 15 % (ref 12–15)
WBC: 8.82 (ref 3.50–10.80)

## 2012-07-31 LAB — BASIC METABOLIC PANEL
Anion Gap: 7 (ref 5.0–15.0)
BUN: 19 mg/dL (ref 9.0–21.0)
CO2: 26 (ref 22–29)
Calcium: 8.6 mg/dL (ref 8.5–10.5)
Chloride: 105 (ref 98–107)
Creatinine: 1.3 mg/dL (ref 0.7–1.3)
Glucose: 98 mg/dL (ref 70–100)
Potassium: 4.7 (ref 3.5–5.1)
Sodium: 138 (ref 136–145)

## 2012-07-31 LAB — GFR: EGFR: 58.2

## 2012-07-31 LAB — PHOSPHORUS: Phosphorus: 3.2 mg/dL (ref 2.3–4.7)

## 2012-07-31 LAB — HEMOLYSIS INDEX: Hemolysis Index: 12 (ref 0–18)

## 2012-07-31 LAB — MAGNESIUM: Magnesium: 2 mg/dL (ref 1.6–2.6)

## 2012-07-31 MED ORDER — RISAQUAD PO CAPS
1.0000 | ORAL_CAPSULE | Freq: Every day | ORAL | Status: DC
Start: 2012-07-31 — End: 2012-08-03
  Administered 2012-07-31 – 2012-08-03 (×4): 1 via ORAL
  Filled 2012-07-31 (×4): qty 1

## 2012-07-31 NOTE — Progress Notes (Signed)
Looks less corrugated today and proximal hematoma area is less tense    Wbc down from 11 to 8 k    Maybe slightly less redness at edges of circled area    Plan:    Antibiotics only for now    Re-eval tomorrow mid day

## 2012-07-31 NOTE — Progress Notes (Signed)
Pt being followed by ID and Orthopedics. WOCN service deferred at this time.  Alvina Filbert, RN BC, CWOCN  Wound, Ostomy, Continence Nurse  [phone]

## 2012-07-31 NOTE — Plan of Care (Signed)
Problem: Pain  Goal: Patient's pain/discomfort is manageable  Outcome: Progressing  Pt's pain will be we controlled.    Comments:   Pt alert and oriented x4, no signs of symptoms, VSS. Pt verbalized 8/10 pain on the left foot, pt given dilaudid 1mg  Q4h with relief. Pt ambulatory and room air. Pt has swelling and redness on the left leg, pt verbalized the swelling reduce as well as the redness. Pt on IV ABX. tolerating diet. Will continue to monitor pt closely.
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
## 2012-07-31 NOTE — Plan of Care (Signed)
Problem: Pain  Goal: Patient's pain/discomfort is manageable  Intervention: Include patient/family/caregiver in decisions related to pain management  Patient complaining of pain on his right foot which is not resolved by po pain medication. After he received Dilaudid 1 mg iv prn he stat that he felt much better.  Plan :- pain management and skin care.         Problem: Tissue integrity  Goal: Damaged tissue is healing and protected  Intervention: Keep intact skin clean and dry.  Patient has cellulites on right lower leg which is associated with swelling.he foot raised up on pillows and received antibiotics.he is not febrile.
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
## 2012-07-31 NOTE — Progress Notes (Signed)
INTERNAL MEDICINE PROGRESS NOTE    Date: 06/18/201411:36 AM  Patient Name:[name],[name] 52 y.o. male admitted with Cellulitis of left leg  Patient status: Inpatient  Hospital Day: 1     Assessment:    Cellulitis of left leg - improving    Hematoma - swelling down    HTN- stable   Leukocytosis - wbc down   Pain left leg sec to above    Plan:    Appreciate ortho and ID input   Continue current abx - on CTX and vanc   Pain control with percocet and dilaudid   Continue other management    [place] plan in 1-2 day once clear by ID    Subjective:   C/o pain left leg. erythema and swelling decreased    Hospital problems:  Principal Problem:   *Cellulitis of left leg  Active Problems:   Hematoma   HTN (hypertension)      Medications:      Scheduled Meds: PRN Meds:         cefTRIAXone 2 g Intravenous Q24H SCH   enoxaparin 40 mg Subcutaneous Daily   lactobacillus/streoptococcus 1 capsule Oral Daily   valsartan-hydrochlorothiazide  160-12.5 (DIOVAN HCT) combo dose  Oral Daily   [COMPLETED] vancomycin 1,000 mg Intravenous Once   vancomycin 1,500 mg Intravenous Q12H   [DISCONTINUED] ciprofloxacin 500 mg Oral BID   [DISCONTINUED] piperacillin-tazobactam 4.5 g Intravenous Once       Continuous Infusions:         acetaminophen 650 mg Q6H PRN   docusate sodium 100 mg Q12H PRN   HYDROmorphone 1 mg Q4H PRN   ondansetron 4 mg Q6H PRN   oxyCODONE-acetaminophen 1 tablet Q6H PRN   polyethylene glycol 17 g QD PRN   Senna 17.2 mg QD PRN             Review of Systems:   General:  Fever/chills: Absent  HEENT: Negative for Headache,blurred vision,sore throat  Respiratory: Cough/ SOB- Absent  Cardiac: Chest pain/SOB- absent  Gastrointestinal: Nausea/vomiting/diarrhea/constipation-Absent, Abdominal pain- absent  Musculoskeletal:Negative for - joint pain, +ve erythema and swelling of left leg   CNS:No headache,weakness,blurred vision     Physical Exam:     Filed Vitals:    07/31/12 0825   BP: 140/85   Pulse: 72   Temp: 96.5 F (35.8 C)    Resp: 20   SpO2: 96%       Intake and Output Summary (Last 24 hours) at Date Time  No intake or output data in the 24 hours ending 07/31/12 1136    General appearance - alert, well appearing, and in no distress  HEENT: NC/AT, PERL b/l, nares clear, normal hearing, o/p clear  Neck - supple  Chest - clear to auscultation  Heart - normal S1 and S2 and regular rhythm  Abdomen - bowel sounds +ve, soft, non distended  Extremities - no pedal edema, left shin with erythema and swelling  Neurological - Alert    Labs:     Lab 07/31/12 0518 07/30/12 1252   WBC 8.82 11.02*   HGB 15.1 15.8   HCT 45.0 47.1   PLT 301 294   MCV 97.4 96.5   NEUTRO 56 75       Lab 07/31/12 0517 07/30/12 1252   NA 138 136   K 4.7 4.2   CL 105 101   CO2 26 25   BUN 19.0 14.0   CREAT 1.3 1.2   GLU 98 92   CA  8.6 9.3   MG 2.0 --   PHOS 3.2 --   PROT -- --   ALB -- --   AST -- --   ALT -- --   ALKPHOS -- --   BILITOTAL -- --     Glucose:    Lab 07/31/12 0517 07/30/12 1252   GLU 98 92     Rads:     Radiology Results (24 Hour)     ** No Results found for the last 24 hours. **            Christophe Louis, DO  Internal Medicine  07/31/2012  11:36 AM

## 2012-08-01 LAB — CBC AND DIFFERENTIAL
Basophils Absolute Automated: 0.04 (ref 0.00–0.20)
Basophils Automated: 0 %
Eosinophils Absolute Automated: 0.57 (ref 0.00–0.70)
Eosinophils Automated: 6 %
Hematocrit: 48.7 % (ref 42.0–52.0)
Hgb: 16.3 g/dL (ref 13.0–17.0)
Immature Granulocytes Absolute: 0.04
Immature Granulocytes: 0 %
Lymphocytes Absolute Automated: 1.71 (ref 0.50–4.40)
Lymphocytes Automated: 18 %
MCH: 32.3 pg — ABNORMAL HIGH (ref 28.0–32.0)
MCHC: 33.5 g/dL (ref 32.0–36.0)
MCV: 96.6 fL (ref 80.0–100.0)
MPV: 11.1 fL (ref 9.4–12.3)
Monocytes Absolute Automated: 1.45 — ABNORMAL HIGH (ref 0.00–1.20)
Monocytes: 15 %
Neutrophils Absolute: 6.01 (ref 1.80–8.10)
Neutrophils: 62 %
Nucleated RBC: 0 (ref 0–1)
Platelets: 326 (ref 140–400)
RBC: 5.04 (ref 4.70–6.00)
RDW: 15 % (ref 12–15)
WBC: 9.78 (ref 3.50–10.80)

## 2012-08-01 LAB — BASIC METABOLIC PANEL
Anion Gap: 8 (ref 5.0–15.0)
BUN: 14 mg/dL (ref 9.0–21.0)
CO2: 26 (ref 22–29)
Calcium: 9.2 mg/dL (ref 8.5–10.5)
Chloride: 104 (ref 98–107)
Creatinine: 1.2 mg/dL (ref 0.7–1.3)
Glucose: 95 mg/dL (ref 70–100)
Potassium: 4.3 (ref 3.5–5.1)
Sodium: 138 (ref 136–145)

## 2012-08-01 LAB — GFR: EGFR: >60

## 2012-08-01 LAB — VANCOMYCIN, TROUGH: Vancomycin Trough: 9.8 ug/mL — ABNORMAL LOW (ref 10.0–20.0)

## 2012-08-01 LAB — HEMOLYSIS INDEX: Hemolysis Index: 5 (ref 0–18)

## 2012-08-01 LAB — PHOSPHORUS: Phosphorus: 2.6 mg/dL (ref 2.3–4.7)

## 2012-08-01 LAB — MAGNESIUM: Magnesium: 2 mg/dL (ref 1.6–2.6)
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
## 2012-08-01 NOTE — Plan of Care (Signed)
Problem: Pain  Goal: Patient's pain/discomfort is manageable  Outcome: Progressing  Pt's pain will be well controlled.    Comments:   Pt alert and oriented x4. Self care, ambulatory, VSS. Pt complains of left leg pain 7/10, pt medicated dilaudid per PRN order with relief. Cellulitis looks much better than yesterday, less swelling and redness. Pt on IV ABX, tolerating diet well. Will continue to monitor pt closely.

## 2012-08-01 NOTE — Progress Notes (Signed)
INTERNAL MEDICINE PROGRESS NOTE    Date: 06/19/20146:40 AM  Patient Name:[name] 52 y.o. male admitted with Cellulitis of left leg  Patient status: Inpatient  Hospital Day: 2     Assessment:    Cellulitis of left leg - improving    Hematoma - swelling down    HTN- stable   Leukocytosis - wbc down   Pain left leg sec to above- persistent     Plan:    Continue to monitor on IV abx as still has severe leg pain   Follow cx result   Pain control with percocet and dilaudid   Continue other management    Violet plan likely in 1-2 day once clear by ID     Subjective:   C/o worsening pain left leg. Erythema and swelling has decreased    Hospital problems:  Principal Problem:   *Cellulitis of left leg  Active Problems:   Hematoma   HTN (hypertension)      Medications:      Scheduled Meds: PRN Meds:           cefTRIAXone 2 g Intravenous Q24H SCH   enoxaparin 40 mg Subcutaneous Daily   lactobacillus/streoptococcus 1 capsule Oral Daily   valsartan-hydrochlorothiazide  160-12.5 (DIOVAN HCT) combo dose  Oral Daily   vancomycin 1,500 mg Intravenous Q12H       Continuous Infusions:         acetaminophen 650 mg Q6H PRN   docusate sodium 100 mg Q12H PRN   HYDROmorphone 1 mg Q4H PRN   ondansetron 4 mg Q6H PRN   oxyCODONE-acetaminophen 1 tablet Q6H PRN   polyethylene glycol 17 g QD PRN   Senna 17.2 mg QD PRN             Review of Systems:   General:  Fever/chills: Absent  HEENT: Negative for Headache,blurred vision,sore throat  Respiratory: Cough/ SOB- Absent  Cardiac: Chest pain/SOB- absent  Gastrointestinal: Nausea/vomiting/diarrhea/constipation-Absent, Abdominal pain- absent  Musculoskeletal:Negative for - joint pain, +ve erythema, swelling, pain of left leg   CNS:No headache,weakness,blurred vision     Physical Exam:     Filed Vitals:    07/31/12 2124   BP: 158/94   Pulse: 66   Temp: 97.6 F (36.4 C)   Resp: 16   SpO2: 96%       Intake and Output Summary (Last 24 hours) at Date Time    Intake/Output Summary (Last 24  hours) at 08/01/12 0640  Last data filed at 08/01/12 0600   Gross per 24 hour   Intake   1600 ml   Output      0 ml   Net   1600 ml       General appearance - alert, well appearing, and in no distress  HEENT: NC/AT, PERL b/l, nares clear, normal hearing, o/p clear  Neck - supple  Chest - clear to auscultation  Heart - normal S1 and S2 and regular rhythm  Abdomen - bowel sounds +ve, soft, non distended  Extremities - no pedal edema, left shin with erythema and swelling  Neurological - Alert    Labs:       Lab 08/01/12 0426 07/31/12 0518 07/30/12 1252   WBC 9.78 8.82 11.02*   HGB 16.3 15.1 15.8   HCT 48.7 45.0 47.1   PLT 326 301 294   MCV 96.6 97.4 96.5   NEUTRO 62 56 75       Lab 08/01/12 0426 07/31/12 0517 07/30/12 1252  NA 138 138 136   K 4.3 4.7 4.2   CL 104 105 101   CO2 26 26 25    BUN 14.0 19.0 14.0   CREAT 1.2 1.3 1.2   GLU 95 98 92   CA 9.2 8.6 9.3   MG 2.0 2.0 --   PHOS 2.6 3.2 --   PROT -- -- --   ALB -- -- --   AST -- -- --   ALT -- -- --   ALKPHOS -- -- --   BILITOTAL -- -- --     Glucose:      Lab 08/01/12 0426 07/31/12 0517 07/30/12 1252   GLU 95 98 92     ORDER#: [id number] ORDERED BY: Catha Gosselin, BONIT  SOURCE: Blood, Venipuncture per rn COLLECTED: 07/30/12 12:52  ANTIBIOTICS AT COLL.: RECEIVED : 07/30/12 15:39  Culture Blood PRELIM 07/31/12 16:15  07/31/12 No Growth after 1 day/s of incubation.    Rads:     Radiology Results (24 Hour)     ** No Results found for the last 24 hours. **        Christophe Louis, DO  Internal Medicine  08/01/2012  9:08 AM

## 2012-08-01 NOTE — Progress Notes (Signed)
Some bloody drainage from site where hematoma was aspirated    Noting throbbing pain when standing    Leg continues to improve - redness receding from edge of drawn demarcation line.    Plan    Antibiotics only for now.      Formal washout of these hematomas almost always results in worse infection and need to be left open to heal by secondary intent so hopefully we can avoid it.

## 2012-08-02 LAB — CBC AND DIFFERENTIAL
Basophils Absolute Automated: 0.03 (ref 0.00–0.20)
Basophils Automated: 0 %
Eosinophils Absolute Automated: 0.49 (ref 0.00–0.70)
Eosinophils Automated: 5 %
Hematocrit: 48.3 % (ref 42.0–52.0)
Hgb: 16.5 g/dL (ref 13.0–17.0)
Immature Granulocytes Absolute: 0.04
Immature Granulocytes: 0 %
Lymphocytes Absolute Automated: 1.62 (ref 0.50–4.40)
Lymphocytes Automated: 16 %
MCH: 32.8 pg — ABNORMAL HIGH (ref 28.0–32.0)
MCHC: 34.2 g/dL (ref 32.0–36.0)
MCV: 96 fL (ref 80.0–100.0)
MPV: 10.3 fL (ref 9.4–12.3)
Monocytes Absolute Automated: 1.32 — ABNORMAL HIGH (ref 0.00–1.20)
Monocytes: 13 %
Neutrophils Absolute: 6.67 (ref 1.80–8.10)
Neutrophils: 66 %
Nucleated RBC: 0 (ref 0–1)
Platelets: 359 (ref 140–400)
RBC: 5.03 (ref 4.70–6.00)
RDW: 15 % (ref 12–15)
WBC: 10.13 (ref 3.50–10.80)

## 2012-08-02 LAB — BASIC METABOLIC PANEL
Anion Gap: 8 (ref 5.0–15.0)
BUN: 12 mg/dL (ref 9.0–21.0)
CO2: 23 (ref 22–29)
Calcium: 9 mg/dL (ref 8.5–10.5)
Chloride: 107 (ref 98–107)
Creatinine: 1.2 mg/dL (ref 0.7–1.3)
Glucose: 108 mg/dL — ABNORMAL HIGH (ref 70–100)
Potassium: 4.5 (ref 3.5–5.1)
Sodium: 138 (ref 136–145)

## 2012-08-02 LAB — GFR: EGFR: >60

## 2012-08-02 LAB — PHOSPHORUS: Phosphorus: 2.7 mg/dL (ref 2.3–4.7)

## 2012-08-02 LAB — HEMOLYSIS INDEX: Hemolysis Index: 13 (ref 0–18)

## 2012-08-02 LAB — MAGNESIUM: Magnesium: 1.8 mg/dL (ref 1.6–2.6)

## 2012-08-02 MED ORDER — HYDRALAZINE HCL 50 MG PO TABS
50.0000 mg | ORAL_TABLET | Freq: Four times a day (QID) | ORAL | Status: DC | PRN
Start: 2012-08-02 — End: 2012-08-03
  Administered 2012-08-03: 50 mg via ORAL
  Filled 2012-08-02: qty 1

## 2012-08-02 NOTE — Progress Notes (Signed)
INTERNAL MEDICINE PROGRESS NOTE    Date: 06/20/20144:38 PM  Patient Name:[name] 52 y.o. male admitted with Cellulitis of left leg  Patient status: Inpatient  Hospital Day: 3     Assessment:    Cellulitis of left leg - improving    Hematoma - swelling down    HTN- stable   Leukocytosis - wbc down   Pain left leg sec to above- persistent     Plan:    Continue to monitor on IV abx as still has severe leg pain   Follow cx result   Pain control with percocet and dilaudid   Continue other management    Possible  Discharge  Tomorrow on augmentin  Discussed  With DR Choudhary.    Subjective:   Feels  Better  Today.    Hospital problems:  Principal Problem:   *Cellulitis of left leg  Active Problems:   Hematoma   HTN (hypertension)      Medications:      Scheduled Meds: PRN Meds:           cefTRIAXone 2 g Intravenous Q24H SCH   enoxaparin 40 mg Subcutaneous Daily   lactobacillus/streoptococcus 1 capsule Oral Daily   valsartan-hydrochlorothiazide  160-12.5 (DIOVAN HCT) combo dose  Oral Daily   vancomycin 1,500 mg Intravenous Q12H       Continuous Infusions:         acetaminophen 650 mg Q6H PRN   docusate sodium 100 mg Q12H PRN   HYDROmorphone 1 mg Q4H PRN   ondansetron 4 mg Q6H PRN   oxyCODONE-acetaminophen 1 tablet Q6H PRN   polyethylene glycol 17 g QD PRN   Senna 17.2 mg QD PRN             Review of Systems:   General:  Fever/chills: Absent  HEENT: Negative for Headache,blurred vision,sore throat  Respiratory: Cough/ SOB- Absent  Cardiac: Chest pain/SOB- absent  Gastrointestinal: Nausea/vomiting/diarrhea/constipation-Absent, Abdominal pain- absent  Musculoskeletal:Negative for - joint pain, +ve erythema, swelling, pain of left leg   CNS:No headache,weakness,blurred vision     Physical Exam:     Filed Vitals:    08/02/12 1541   BP: 158/96   Pulse: 73   Temp: 98 F (36.7 C)   Resp: 18   SpO2: 96%       Intake and Output Summary (Last 24 hours) at Date Time    Intake/Output Summary (Last 24 hours) at 08/02/12  1638  Last data filed at 08/02/12 0503   Gross per 24 hour   Intake    550 ml   Output      0 ml   Net    550 ml       General appearance - alert, well appearing, and in no distress  HEENT: NC/AT, PERL b/l, nares clear, normal hearing, o/p clear  Neck - supple  Chest - clear to auscultation  Heart - normal S1 and S2 and regular rhythm  Abdomen - bowel sounds +ve, soft, non distended  Extremities - no pedal edema, left shin with erythema and swelling  Neurological - Alert    Labs:       Lab 08/02/12 0544 08/01/12 0426 07/31/12 0518   WBC 10.13 9.78 8.82   HGB 16.5 16.3 15.1   HCT 48.3 48.7 45.0   PLT 359 326 301   MCV 96.0 96.6 97.4   NEUTRO 66 62 56       Lab 08/02/12 0544 08/01/12 0426 07/31/12 0517   NA  138 138 138   K 4.5 4.3 4.7   CL 107 104 105   CO2 23 26 26    BUN 12.0 14.0 19.0   CREAT 1.2 1.2 1.3   GLU 108* 95 98   CA 9.0 9.2 8.6   MG 1.8 2.0 2.0   PHOS 2.7 2.6 3.2   PROT -- -- --   ALB -- -- --   AST -- -- --   ALT -- -- --   ALKPHOS -- -- --   BILITOTAL -- -- --     Glucose:      Lab 08/02/12 0544 08/01/12 0426 07/31/12 0517 07/30/12 1252   GLU 108* 95 98 92     ORDER#: [id number] ORDERED BY: Catha Gosselin, BONIT  SOURCE: Blood, Venipuncture per rn COLLECTED: 07/30/12 12:52  ANTIBIOTICS AT COLL.: RECEIVED : 07/30/12 15:39  Culture Blood PRELIM 07/31/12 16:15  07/31/12 No Growth after 1 day/s of incubation.    Rads:     Radiology Results (24 Hour)     ** No Results found for the last 24 hours. **        Gwyndolyn Kaufman, MD  Internal Medicine  08/02/2012  4:38 PM

## 2012-08-02 NOTE — Progress Notes (Signed)
Infectious Diseases & Tropical Medicine  Progress Note    08/02/2012   [name] [id number] is a 52 y.o. male,       Assessment:     Hypertension   LLE trauma   Likely hematoma   Cellulitis improving  Leukocytosis resolved  Clinically improved    Plan:      Continue vancomycin and ceftriaxone for one more day   Pharmacy monitoring vancomycin level   Continue probiotics    Orthopedic following - no surgical plan   Elevation   Monitor clinically   OK to discharge on P.O antibiotics likely tomorrow   D/W Dr Lestine Mount    ROS:     General:  no fever, no chills, no rigor,awake and alert  HEENT: no neck pain, no throat pain  Endocrine: no fatigue   Respiratory: no cough, shortness of breath, or wheezing   Cardiovascular: no chest pain   Gastrointestinal: no abdominal pain,no N/V/D  Genito-Urinary: no dysuria, trouble voiding, or hematuria   Musculoskeletal: left leg pain and swelling - improving,c/o pain in left leg during walking  Neurological: c/o generalized weakness   Dermatological: no rash, no ulcer    Physical Examination:     Blood pressure 143/79, pulse 59, temperature 96.7 F (35.9 C), temperature source Oral, resp. rate 18, height 1.829 m (6' 0.01"), weight 102.967 kg (227 lb), SpO2 98.00%.     General Appearance: Comfortable, and in no acute distress.feeling better today     HEENT: Pupils are equal, round, and reactive to light.    Lungs:  CTA   Heart: RRR   Chest: Symmetric chest wall expansion.    Abdomen: soft ,non tender,no hepatosplenomegaly   Neurological: No focal deficit   Extremities: left leg erythema improving,hematoma soft with fluctuation still same    Laboratory And Diagnostic Studies:     Recent Labs   [facility] 08/02/12 0544 08/01/12 0426    WBC 10.13 9.78    HGB 16.5 16.3    HCT 48.3 48.7    PLT 359 326     Recent Labs   Basename 08/02/12 0544 08/01/12 0426    NA 138 138    K 4.5 4.3    CL 107 104    CO2 23 26    BUN 12.0 14.0    CREAT 1.2 1.2    GLU 108* 95     CA 9.0 9.2     No results found for this basename: AST:2,ALT:2,ALKPHOS:2,PROT:2,ALB:2 in the last 72 hours    Current Meds:      Scheduled Meds: PRN Meds:           cefTRIAXone 2 g Intravenous Q24H SCH   enoxaparin 40 mg Subcutaneous Daily   lactobacillus/streoptococcus 1 capsule Oral Daily   valsartan-hydrochlorothiazide  160-12.5 (DIOVAN HCT) combo dose  Oral Daily   vancomycin 1,500 mg Intravenous Q12H       Continuous Infusions:         acetaminophen 650 mg Q6H PRN   docusate sodium 100 mg Q12H PRN   HYDROmorphone 1 mg Q4H PRN   ondansetron 4 mg Q6H PRN   oxyCODONE-acetaminophen 1 tablet Q6H PRN   polyethylene glycol 17 g QD PRN   Senna 17.2 mg QD PRN         [name] A. Janalyn Rouse, M.D.  08/02/2012  8:22 AM

## 2012-08-02 NOTE — Plan of Care (Signed)
Problem: Pain  Goal: Patient's pain/discomfort is manageable  Outcome: Progressing  Pt is AOx4; no signs of any distress; admitted with left leg cellulitis; L leg wound swollen and red; pt verbalized that it looks better compared to previous days and that swelling and redness is lessened;  Pt complained of pain on the Left leg; medicated with percocet; pt verbalized good relief; pt able to sleep well as of the moment; uses a walker to ambulate; fall precaution maintained; will continue with IV antibiotic and continue to monitor and assess pain and manage it accordingly.

## 2012-08-03 MED ORDER — ACETAMINOPHEN 325 MG PO TABS
650.00 mg | ORAL_TABLET | Freq: Four times a day (QID) | ORAL | Status: DC | PRN
Start: 2012-08-03 — End: 2013-06-06

## 2012-08-03 MED ORDER — AMOXICILLIN-POT CLAVULANATE 875-125 MG PO TABS
1.00 | ORAL_TABLET | Freq: Two times a day (BID) | ORAL | Status: DC
Start: 2012-08-03 — End: 2013-06-03

## 2012-08-03 MED ORDER — OXYCODONE-ACETAMINOPHEN 5-325 MG PO TABS
1.0000 | ORAL_TABLET | Freq: Four times a day (QID) | ORAL | Status: DC | PRN
Start: 2012-08-03 — End: 2013-06-06

## 2012-08-03 NOTE — Discharge Instructions (Signed)
Date Time: 08/03/2012 10:55 AM  Attending Physician: Christophe Louis, DO    Date of Admission:   07/30/2012    Reason for Admission:   Cellulitis of left leg [682.6] (cellulitis)  [id number] Cellulitis of leg, [id number]    Follow up:        Neurology (If Stroke): ______________________  Phone:______________________    Other:___________________________________   Phone: ______________________    If you are taking Warfarin, please follow up with (health professional/clinic) ____****________ on ____****_________to have your PT/INR blood test checked.          Medications:    Your medications have been listed for you on the Medication Reconciliation Discharge Home List. Please bring a copy of all discharge instructions, including your Medication Reconciliation Discharge Home List when you visit your physician.      Continue taking all medications even if you feel well, unless otherwise instructed by physician.    Do not take any over-the-counter medications or herbal supplements without checking with your pharmacist or doctor.     Activity:    Rise slowly from a sitting or lying position. Increase activity slowly, unless otherwise instructed by physician.    Perform exercises as desginated by Therapist and Physician.    In the event of severe shortness of breath or chest discomfort, call 911. Do NOT drive to the hospital.    Speak with your physician regarding specific driving and/or work restrictions.    Diet:    If you have special diet orders, you have been given printed diet instructions.   ________________________________________________________________________    Tobacco Cessation Counseling:  If you are currently a tobacco user or have used tobacco within the last 12 months, we have provided you with written Tobacco Cessation Counseling.  ________________________________________________________________________    Heart Failure Education:  If you have a diagnosis of a Heart Failure, we have provided you with written  Heart Failure Education.     Weigh yourself once a day at the same time. Record and bring the weight record to your next physician appointment.     Call your doctor if you gain more than 3 pounds in one day or 5 pounds in one week, or if you experience shortness of breath, leg swelling and/or chest discomfort.     Enroll in the [facility] Tel-Assurance Program, a heart failure patient support program. Call [phone] for additional details.   ________________________________________________________________________    Diamond [facility] Education:    Call 911 for:    Sudden numbness or weakness of the face    Sudden numbness of the arm or leg especially one side of the body    Sudden confusion, trouble speaking or understanding    Sudden trouble seeing in one or both eyes    Sudden trouble walking or dizziness, loss of balance or coordination        For promotion of your health, we have provided you with personalized written education on risk factors specific to your diagnosis, including but not limited to:    High Blood Pressure    High Cholesterol    Atrial Fibrillation    Overweight    Diabetes    Smoking         Vaccinations  Pneumonia Vaccine Received on: n/a  Flu Vaccine Received on: n/a             Treatments/Special Instructions:   Please take your antibiotics as prescribed              Signed by: Stephanie Acre  Eilene Ghazi, RN    I HAVE RECEIVED AND UNDERSTAND THESE DISCHARGE INSTRUCTIONS.

## 2012-08-03 NOTE — Progress Notes (Signed)
INTERNAL MEDICINE PROGRESS NOTE    Date: 06/21/201410:37 AM  Patient Name:[name],[name] 52 y.o. male admitted with Cellulitis of left leg  Patient status: Inpatient  Hospital Day: 4     Assessment:    Cellulitis of left leg - improving    Hematoma - swelling down    HTN- stable   Leukocytosis - wbc down   Pain left leg sec to above- persistent     Plan:    Discharge  Home today on AUGMENTIN    Discussed  With DR Choudhary.    Subjective:   Feels  Better  Today.    Hospital problems:  Principal Problem:   *Cellulitis of left leg  Active Problems:   Hematoma   HTN (hypertension)      Medications:      Scheduled Meds: PRN Meds:           cefTRIAXone 2 g Intravenous Q24H SCH   enoxaparin 40 mg Subcutaneous Daily   lactobacillus/streoptococcus 1 capsule Oral Daily   valsartan-hydrochlorothiazide  160-12.5 (DIOVAN HCT) combo dose  Oral Daily   vancomycin 1,500 mg Intravenous Q12H       Continuous Infusions:         acetaminophen 650 mg Q6H PRN   docusate sodium 100 mg Q12H PRN   hydrALAZINE 50 mg Q6H PRN   HYDROmorphone 1 mg Q4H PRN   ondansetron 4 mg Q6H PRN   oxyCODONE-acetaminophen 1 tablet Q6H PRN   polyethylene glycol 17 g QD PRN   Senna 17.2 mg QD PRN             Review of Systems:   General:  Fever/chills: Absent  HEENT: Negative for Headache,blurred vision,sore throat  Respiratory: Cough/ SOB- Absent  Cardiac: Chest pain/SOB- absent  Gastrointestinal: Nausea/vomiting/diarrhea/constipation-Absent, Abdominal pain- absent  Musculoskeletal:Negative for - joint pain, +ve erythema, swelling, pain of left leg   CNS:No headache,weakness,blurred vision     Physical Exam:     Filed Vitals:    08/03/12 0947   BP: 163/90   Pulse: 73   Temp:    Resp:    SpO2:        Intake and Output Summary (Last 24 hours) at Date Time  No intake or output data in the 24 hours ending 08/03/12 1037    General appearance - alert, well appearing, and in no distress  HEENT: NC/AT, PERL b/l, nares clear, normal hearing, o/p clear  Neck -  supple  Chest - clear to auscultation  Heart - normal S1 and S2 and regular rhythm  Abdomen - bowel sounds +ve, soft, non distended  Extremities - no pedal edema, left shin with erythema and swelling  Neurological - Alert    Labs:       Lab 08/02/12 0544 08/01/12 0426 07/31/12 0518   WBC 10.13 9.78 8.82   HGB 16.5 16.3 15.1   HCT 48.3 48.7 45.0   PLT 359 326 301   MCV 96.0 96.6 97.4   NEUTRO 66 62 56       Lab 08/02/12 0544 08/01/12 0426 07/31/12 0517   NA 138 138 138   K 4.5 4.3 4.7   CL 107 104 105   CO2 23 26 26    BUN 12.0 14.0 19.0   CREAT 1.2 1.2 1.3   GLU 108* 95 98   CA 9.0 9.2 8.6   MG 1.8 2.0 2.0   PHOS 2.7 2.6 3.2   PROT -- -- --   ALB -- -- --  AST -- -- --   ALT -- -- --   ALKPHOS -- -- --   BILITOTAL -- -- --     Glucose:      Lab 08/02/12 0544 08/01/12 0426 07/31/12 0517 07/30/12 1252   GLU 108* 95 98 92     ORDER#: [id number] ORDERED BY: Catha Gosselin, BONIT  SOURCE: Blood, Venipuncture per rn COLLECTED: 07/30/12 12:52  ANTIBIOTICS AT COLL.: RECEIVED : 07/30/12 15:39  Culture Blood PRELIM 07/31/12 16:15  07/31/12 No Growth after 1 day/s of incubation.    Rads:     Radiology Results (24 Hour)     ** No Results found for the last 24 hours. **        Gwyndolyn Kaufman, MD  Internal Medicine  08/03/2012  10:37 AM

## 2012-08-03 NOTE — Progress Notes (Signed)
Patient is medically stable for discharge home. Declined wheelchair at discharge. Escorted to Patient Entrance by RN. Patient was provided with prescriptions and instructed to follow up with Dr. Janalyn Rouse early next week. Patient was given discharge instructions and information on new medications. Saline lock removed.

## 2012-08-03 NOTE — Progress Notes (Signed)
Infectious Diseases & Tropical Medicine  Progress Note    08/03/2012   [name] [id number] is a 52 y.o. male,       Assessment:     Hypertension   LLE trauma   Likely hematoma   Cellulitis improving  Leukocytosis resolved  Clinically improved    Plan:      OK to discharge from ID standpoint   Augmentin x 7 days   Side effects D/W patient   Follow up in office next week   D/W Dr Lestine Mount    ROS:     General:  no fever, no chills, no rigor,awake and alert  HEENT: no neck pain, no throat pain  Endocrine: no fatigue   Respiratory: no cough, shortness of breath, or wheezing   Cardiovascular: no chest pain   Gastrointestinal: no abdominal pain,no N/V/D  Genito-Urinary: no dysuria, trouble voiding, or hematuria   Musculoskeletal: left leg pain and swelling - improving,pain improving  Neurological: c/o generalized weakness   Dermatological: no rash, no ulcer    Physical Examination:     Blood pressure 140/76, pulse 66, temperature 97.7 F (36.5 C), temperature source Oral, resp. rate 18, height 1.829 m (6' 0.01"), weight 102.967 kg (227 lb), SpO2 97.00%.     General Appearance: Comfortable, and in no acute distress.feeling better   HEENT: Pupils are equal, round, and reactive to light.    Lungs:  CTA   Heart: RRR   Chest: Symmetric chest wall expansion.    Abdomen: soft ,non tender,no hepatosplenomegaly   Neurological: No focal deficit   Extremities: left leg erythema improving,hematoma soft with fluctuation somewhat improved    Laboratory And Diagnostic Studies:     Recent Labs   [facility] 08/02/12 0544 08/01/12 0426    WBC 10.13 9.78    HGB 16.5 16.3    HCT 48.3 48.7    PLT 359 326     Recent Labs   Basename 08/02/12 0544 08/01/12 0426    NA 138 138    K 4.5 4.3    CL 107 104    CO2 23 26    BUN 12.0 14.0    CREAT 1.2 1.2    GLU 108* 95    CA 9.0 9.2     No results found for this basename: AST:2,ALT:2,ALKPHOS:2,PROT:2,ALB:2 in the last 72 hours    Current Meds:      Scheduled Meds: PRN  Meds:           cefTRIAXone 2 g Intravenous Q24H SCH   enoxaparin 40 mg Subcutaneous Daily   lactobacillus/streoptococcus 1 capsule Oral Daily   valsartan-hydrochlorothiazide  160-12.5 (DIOVAN HCT) combo dose  Oral Daily   vancomycin 1,500 mg Intravenous Q12H       Continuous Infusions:         acetaminophen 650 mg Q6H PRN   docusate sodium 100 mg Q12H PRN   hydrALAZINE 50 mg Q6H PRN   HYDROmorphone 1 mg Q4H PRN   ondansetron 4 mg Q6H PRN   oxyCODONE-acetaminophen 1 tablet Q6H PRN   polyethylene glycol 17 g QD PRN   Senna 17.2 mg QD PRN         Kaileen Bronkema A. Janalyn Rouse, M.D.  08/03/2012  9:41 AM

## 2012-08-03 NOTE — Plan of Care (Signed)
Problem: Pain  Goal: Patient's pain/discomfort is manageable  Outcome: Progressing  Intervention: Offer non-pharmocological pain management interventions  Patient complained of headache relieved by Tylenol and cold compress. Will reassess for any pain and discomfort.      Problem: Moderate/High Fall Risk Score >/=15  Goal: Patient will remain free of falls  Patient is moderate fall risk but able to call for assistance if needed. Limping due to left lower leg cellulitis that causes pain with weight-bearing. Continue hourly rounding and offer toilet use prior to pain med.    Problem: Tissue integrity  Goal: Damaged tissue is healing and protected  Outcome: Progressing  Intervention: Keep intact skin clean and dry.  Patient's left lower leg cellulitis has decreased swelling and redness, wrinkling of skin noted. Keep LLE elevated above heart level with pillows.
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
## 2012-12-31 ENCOUNTER — Other Ambulatory Visit: Payer: Self-pay | Admitting: Orthopaedic Surgery

## 2012-12-31 DIAGNOSIS — M25511 Pain in right shoulder: Secondary | ICD-10-CM

## 2013-01-01 ENCOUNTER — Ambulatory Visit: Payer: No Typology Code available for payment source

## 2013-01-03 ENCOUNTER — Ambulatory Visit: Payer: No Typology Code available for payment source

## 2013-01-21 ENCOUNTER — Ambulatory Visit: Payer: No Typology Code available for payment source

## 2013-02-13 DIAGNOSIS — Z8614 Personal history of Methicillin resistant Staphylococcus aureus infection: Secondary | ICD-10-CM

## 2013-02-13 HISTORY — DX: Personal history of Methicillin resistant Staphylococcus aureus infection: Z86.14

## 2013-04-27 ENCOUNTER — Ambulatory Visit: Payer: No Typology Code available for payment source | Attending: Orthopaedic Surgery

## 2013-04-27 DIAGNOSIS — M25511 Pain in right shoulder: Secondary | ICD-10-CM

## 2013-04-27 DIAGNOSIS — Z09 Encounter for follow-up examination after completed treatment for conditions other than malignant neoplasm: Secondary | ICD-10-CM | POA: Insufficient documentation

## 2013-04-27 DIAGNOSIS — M24819 Other specific joint derangements of unspecified shoulder, not elsewhere classified: Secondary | ICD-10-CM | POA: Insufficient documentation

## 2013-04-27 DIAGNOSIS — M25519 Pain in unspecified shoulder: Secondary | ICD-10-CM | POA: Insufficient documentation

## 2013-04-27 DIAGNOSIS — M67919 Unspecified disorder of synovium and tendon, unspecified shoulder: Secondary | ICD-10-CM | POA: Insufficient documentation

## 2013-04-27 DIAGNOSIS — M75101 Unspecified rotator cuff tear or rupture of right shoulder, not specified as traumatic: Secondary | ICD-10-CM

## 2013-04-27 DIAGNOSIS — M719 Bursopathy, unspecified: Secondary | ICD-10-CM | POA: Insufficient documentation

## 2013-05-21 ENCOUNTER — Ambulatory Visit: Payer: No Typology Code available for payment source | Attending: Orthopaedic Surgery

## 2013-05-21 DIAGNOSIS — M7520 Bicipital tendinitis, unspecified shoulder: Secondary | ICD-10-CM | POA: Insufficient documentation

## 2013-05-21 DIAGNOSIS — S43429A Sprain of unspecified rotator cuff capsule, initial encounter: Secondary | ICD-10-CM

## 2013-05-21 LAB — BASIC METABOLIC PANEL
BUN: 17 mg/dL (ref 8.0–20.0)
CO2: 27 mEq/L (ref 21–30)
Calcium: 9.4 mg/dL (ref 8.5–10.5)
Chloride: 100 mEq/L (ref 96–109)
Creatinine: 1.2 mg/dL (ref 0.5–1.5)
Glucose: 70 mg/dL (ref 70–100)
Potassium: 4.8 mEq/L (ref 3.5–5.3)
Sodium: 138 mEq/L (ref 135–146)

## 2013-05-21 LAB — CBC
Hematocrit: 47.5 % (ref 42.0–52.0)
Hgb: 15.8 g/dL (ref 13.0–17.0)
MCH: 31.8 pg (ref 28.0–32.0)
MCHC: 33.3 g/dL (ref 32.0–36.0)
MCV: 95.6 fL (ref 80.0–100.0)
MPV: 10.9 fL (ref 9.4–12.3)
Nucleated RBC: 0 /100 WBC (ref 0–1)
Platelets: 361 10*3/uL (ref 140–400)
RBC: 4.97 10*6/uL (ref 4.70–6.00)
RDW: 16 % — ABNORMAL HIGH (ref 12–15)
WBC: 9.86 10*3/uL (ref 3.50–10.80)

## 2013-05-21 LAB — URINALYSIS
Bilirubin, UA: NEGATIVE
Blood, UA: NEGATIVE
Glucose, UA: NEGATIVE
Ketones UA: NEGATIVE
Leukocyte Esterase, UA: NEGATIVE
Nitrite, UA: NEGATIVE
Protein, UR: NEGATIVE
Specific Gravity UA: 1.015 (ref 1.001–1.035)
Urine pH: 6 (ref 5.0–8.0)
Urobilinogen, UA: 0.2 (ref 0.2–2.0)

## 2013-05-21 LAB — GFR: EGFR: >60

## 2013-05-21 LAB — HEMOLYSIS INDEX: Hemolysis Index: 31 — ABNORMAL HIGH (ref 0–18)

## 2013-05-22 LAB — ECG 12-LEAD
Atrial Rate: 65 {beats}/min
P Axis: 60 degrees
P-R Interval: 130 ms
Q-T Interval: 402 ms
QRS Duration: 98 ms
QTC Calculation (Bezet): 418 ms
R Axis: 61 degrees
T Axis: 44 degrees
Ventricular Rate: 65 {beats}/min

## 2013-06-02 ENCOUNTER — Ambulatory Visit: Payer: No Typology Code available for payment source | Attending: Orthopaedic Surgery

## 2013-06-02 DIAGNOSIS — IMO0001 Reserved for inherently not codable concepts without codable children: Secondary | ICD-10-CM

## 2013-06-02 DIAGNOSIS — L089 Local infection of the skin and subcutaneous tissue, unspecified: Secondary | ICD-10-CM | POA: Insufficient documentation

## 2013-06-02 DIAGNOSIS — S40259A Superficial foreign body of unspecified shoulder, initial encounter: Secondary | ICD-10-CM | POA: Insufficient documentation

## 2013-06-02 DIAGNOSIS — Z01818 Encounter for other preprocedural examination: Secondary | ICD-10-CM

## 2013-06-02 LAB — CBC
Hematocrit: 46.6 % (ref 42.0–52.0)
Hgb: 15.6 g/dL (ref 13.0–17.0)
MCH: 32 pg (ref 28.0–32.0)
MCHC: 33.5 g/dL (ref 32.0–36.0)
MCV: 95.5 fL (ref 80.0–100.0)
MPV: 10.4 fL (ref 9.4–12.3)
Nucleated RBC: 0 /100 WBC (ref 0–1)
Platelets: 420 10*3/uL — ABNORMAL HIGH (ref 140–400)
RBC: 4.88 10*6/uL (ref 4.70–6.00)
RDW: 15 % (ref 12–15)
WBC: 13.85 10*3/uL — ABNORMAL HIGH (ref 3.50–10.80)

## 2013-06-02 LAB — URINALYSIS
Bilirubin, UA: NEGATIVE
Blood, UA: NEGATIVE
Glucose, UA: NEGATIVE
Ketones UA: NEGATIVE
Leukocyte Esterase, UA: NEGATIVE
Nitrite, UA: NEGATIVE
Specific Gravity UA: 1.021 (ref 1.001–1.035)
Urine pH: 7.5 (ref 5.0–8.0)
Urobilinogen, UA: 0.2 (ref 0.2–2.0)

## 2013-06-02 LAB — BASIC METABOLIC PANEL
BUN: 14 mg/dL (ref 9.0–28.0)
CO2: 28 mEq/L (ref 21–30)
Calcium: 9.4 mg/dL (ref 8.5–10.5)
Chloride: 99 mEq/L — ABNORMAL LOW (ref 100–111)
Creatinine: 1.1 mg/dL (ref 0.5–1.5)
Glucose: 78 mg/dL (ref 70–100)
Potassium: 4.9 mEq/L (ref 3.5–5.3)
Sodium: 141 mEq/L (ref 135–146)

## 2013-06-02 LAB — HEMOLYSIS INDEX: Hemolysis Index: 6 (ref 0–18)

## 2013-06-02 LAB — GFR: EGFR: >60

## 2013-06-03 NOTE — Pre-Procedure Instructions (Addendum)
Contacted patient for an abbreviated interview on the day before surgery. ADA, Vitals, Allergies, Medications, Ebola Screen, Infectious disease, and OB/GYN status (if applicable) completely documented. Brief focused history, Physician identification, STOP-BANG (if applicable), pediatric general sections (if applicable), pt. Identifiers, case identifiers, NPO instructions, and brief pre-op instructions documented.   All testing requested by the Surgeon, PMD, or anesthesia guidelines has been completed, is in epic, and results are outside the defined limits. EKG WDL, WBC elevated. Readiness notified to follow up.

## 2013-06-03 NOTE — Pre-Procedure Instructions (Signed)
Reviewed/faxed 06/02/13 abnormal labs.

## 2013-06-04 ENCOUNTER — Encounter: Payer: Self-pay | Admitting: Certified Registered"

## 2013-06-04 ENCOUNTER — Encounter
Admission: RE | Disposition: A | Discharge status: Home / Self Care | Payer: Self-pay | Source: Ambulatory Visit | Attending: Orthopaedic Surgery

## 2013-06-04 ENCOUNTER — Inpatient Hospital Stay: Payer: BC Managed Care – PPO | Admitting: Orthopaedic Surgery

## 2013-06-04 ENCOUNTER — Inpatient Hospital Stay
Admission: RE | Admit: 2013-06-04 | Discharge: 2013-06-06 | DRG: 496 | Disposition: A | Discharge status: Home / Self Care | Payer: No Typology Code available for payment source | Source: Ambulatory Visit | Attending: Orthopaedic Surgery | Admitting: Orthopaedic Surgery

## 2013-06-04 ENCOUNTER — Observation Stay: Payer: No Typology Code available for payment source | Admitting: Certified Registered"

## 2013-06-04 DIAGNOSIS — M67919 Unspecified disorder of synovium and tendon, unspecified shoulder: Principal | ICD-10-CM | POA: Diagnosis present

## 2013-06-04 DIAGNOSIS — M719 Bursopathy, unspecified: Principal | ICD-10-CM | POA: Diagnosis present

## 2013-06-04 DIAGNOSIS — T8140XA Infection following a procedure, unspecified, initial encounter: Secondary | ICD-10-CM | POA: Diagnosis present

## 2013-06-04 DIAGNOSIS — Y838 Other surgical procedures as the cause of abnormal reaction of the patient, or of later complication, without mention of misadventure at the time of the procedure: Secondary | ICD-10-CM | POA: Diagnosis present

## 2013-06-04 DIAGNOSIS — M869 Osteomyelitis, unspecified: Secondary | ICD-10-CM | POA: Diagnosis present

## 2013-06-04 DIAGNOSIS — I10 Essential (primary) hypertension: Secondary | ICD-10-CM | POA: Diagnosis present

## 2013-06-04 HISTORY — PX: DEBRIDEMENT & IRRIGATION, UPPER EXTREMITY: SHX3695

## 2013-06-04 SURGERY — DEBRIDEMENT AND IRRIGATION, UPPER EXTREMITY
Anesthesia: Anesthesia General | Site: Shoulder | Laterality: Right | Wound class: Dirty or Infected

## 2013-06-04 MED ORDER — MIDAZOLAM HCL 2 MG/2ML IJ SOLN
INTRAMUSCULAR | Status: DC | PRN
Start: 2013-06-04 — End: 2013-06-04
  Administered 2013-06-04: 2 mg via INTRAVENOUS

## 2013-06-04 MED ORDER — SODIUM CHLORIDE BACTERIOSTATIC 0.9 % IJ SOLN
INTRAMUSCULAR | Status: DC | PRN
Start: 2013-06-04 — End: 2013-06-04
  Administered 2013-06-04: 10 mL

## 2013-06-04 MED ORDER — SODIUM CHLORIDE 0.9 % IR SOLN
Status: DC | PRN
Start: 2013-06-04 — End: 2013-06-04
  Administered 2013-06-04: 3000 mL
  Administered 2013-06-04: 1000 mL

## 2013-06-04 MED ORDER — HYDROMORPHONE HCL PF 1 MG/ML IJ SOLN
INTRAMUSCULAR | Status: AC
Start: 2013-06-04 — End: ?
  Filled 2013-06-04: qty 1

## 2013-06-04 MED ORDER — OXYCODONE-ACETAMINOPHEN 5-325 MG PO TABS
1.0000 | ORAL_TABLET | Freq: Once | ORAL | Status: AC
Start: 2013-06-04 — End: 2013-06-04

## 2013-06-04 MED ORDER — VALSARTAN 160 MG PO TABS
ORAL_TABLET | Freq: Every day | ORAL | Status: DC
Start: 2013-06-05 — End: 2013-06-06
  Filled 2013-06-04 (×4): qty 1

## 2013-06-04 MED ORDER — BUPIVACAINE-EPINEPHRINE (PF) 0.5% -1:200000 IJ SOLN
INTRAMUSCULAR | Status: DC | PRN
Start: 2013-06-04 — End: 2013-06-04
  Administered 2013-06-04: 10 mL

## 2013-06-04 MED ORDER — VANCOMYCIN HCL 1000 MG IV SOLR
1000.0000 mg | Freq: Two times a day (BID) | INTRAVENOUS | Status: DC
Start: 2013-06-04 — End: 2013-06-04
  Administered 2013-06-04: 1 g via INTRAVENOUS

## 2013-06-04 MED ORDER — SODIUM CHLORIDE 0.9 % IV SOLN
INTRAVENOUS | Status: DC
Start: 2013-06-04 — End: 2013-06-06

## 2013-06-04 MED ORDER — MEPERIDINE HCL 25 MG/ML IJ SOLN
12.5000 mg | INTRAMUSCULAR | Status: DC | PRN
Start: 2013-06-04 — End: 2013-06-04

## 2013-06-04 MED ORDER — DIPHENHYDRAMINE HCL 50 MG/ML IJ SOLN
6.2500 mg | Freq: Four times a day (QID) | INTRAMUSCULAR | Status: DC | PRN
Start: 2013-06-04 — End: 2013-06-04

## 2013-06-04 MED ORDER — PROPOFOL 10 MG/ML IV EMUL
INTRAVENOUS | Status: AC
Start: 2013-06-04 — End: ?
  Filled 2013-06-04: qty 20

## 2013-06-04 MED ORDER — LACTATED RINGERS IV SOLN
100.0000 mL/h | INTRAVENOUS | Status: DC
Start: 2013-06-04 — End: 2013-06-04

## 2013-06-04 MED ORDER — LIDOCAINE HCL 2 % IJ SOLN
INTRAMUSCULAR | Status: DC | PRN
Start: 2013-06-04 — End: 2013-06-04
  Administered 2013-06-04: 100 mg

## 2013-06-04 MED ORDER — ASPIRIN 325 MG PO TABS
325.0000 mg | ORAL_TABLET | Freq: Two times a day (BID) | ORAL | Status: DC
Start: 2013-06-04 — End: 2013-06-06
  Administered 2013-06-04 – 2013-06-06 (×4): 325 mg via ORAL
  Filled 2013-06-04 (×4): qty 1

## 2013-06-04 MED ORDER — FENTANYL CITRATE 0.05 MG/ML IJ SOLN
50.0000 ug | INTRAMUSCULAR | Status: AC | PRN
Start: 2013-06-04 — End: 2013-06-04
  Administered 2013-06-04 (×2): 50 ug via INTRAVENOUS

## 2013-06-04 MED ORDER — FENTANYL CITRATE 0.05 MG/ML IJ SOLN
INTRAMUSCULAR | Status: AC
Start: 2013-06-04 — End: 2013-06-04
  Administered 2013-06-04: 50 ug via INTRAVENOUS
  Filled 2013-06-04: qty 2

## 2013-06-04 MED ORDER — PROPOFOL INFUSION 10 MG/ML
INTRAVENOUS | Status: DC | PRN
Start: 2013-06-04 — End: 2013-06-04
  Administered 2013-06-04: 200 mg via INTRAVENOUS

## 2013-06-04 MED ORDER — LIDOCAINE HCL (PF) 2 % IJ SOLN
INTRAMUSCULAR | Status: AC
Start: 2013-06-04 — End: ?
  Filled 2013-06-04: qty 5

## 2013-06-04 MED ORDER — OXYCODONE-ACETAMINOPHEN 5-325 MG PO TABS
ORAL_TABLET | ORAL | Status: AC
Start: 2013-06-04 — End: 2013-06-04
  Administered 2013-06-04: 1 via ORAL
  Filled 2013-06-04: qty 1

## 2013-06-04 MED ORDER — BACITRACIN 50000 UNITS IM SOLR
INTRAMUSCULAR | Status: DC | PRN
Start: 2013-06-04 — End: 2013-06-04
  Administered 2013-06-04: 50000 [IU]

## 2013-06-04 MED ORDER — ROCURONIUM BROMIDE 50 MG/5ML IV SOLN
INTRAVENOUS | Status: AC
Start: 2013-06-04 — End: ?
  Filled 2013-06-04: qty 5

## 2013-06-04 MED ORDER — ONDANSETRON HCL 4 MG/2ML IJ SOLN
INTRAMUSCULAR | Status: AC
Start: 2013-06-04 — End: ?
  Filled 2013-06-04: qty 2

## 2013-06-04 MED ORDER — GLYCOPYRROLATE 0.2 MG/ML IJ SOLN
INTRAMUSCULAR | Status: AC
Start: 2013-06-04 — End: ?
  Filled 2013-06-04: qty 3

## 2013-06-04 MED ORDER — MIDAZOLAM HCL 2 MG/2ML IJ SOLN
INTRAMUSCULAR | Status: AC
Start: 2013-06-04 — End: ?
  Filled 2013-06-04: qty 2

## 2013-06-04 MED ORDER — LACTATED RINGERS IV SOLN
INTRAVENOUS | Status: DC
Start: 2013-06-04 — End: 2013-06-04

## 2013-06-04 MED ORDER — HYDROMORPHONE HCL PF 1 MG/ML IJ SOLN
0.5000 mg | INTRAMUSCULAR | Status: AC | PRN
Start: 2013-06-04 — End: 2013-06-04
  Administered 2013-06-04 (×2): 0.5 mg via INTRAVENOUS

## 2013-06-04 MED ORDER — OXYCODONE-ACETAMINOPHEN 5-325 MG PO TABS
2.0000 | ORAL_TABLET | ORAL | Status: DC | PRN
Start: 2013-06-04 — End: 2013-06-06
  Administered 2013-06-04 – 2013-06-06 (×9): 2 via ORAL
  Filled 2013-06-04 (×9): qty 2

## 2013-06-04 MED ORDER — HYDROMORPHONE HCL PF 1 MG/ML IJ SOLN
INTRAMUSCULAR | Status: DC | PRN
Start: 2013-06-04 — End: 2013-06-04
  Administered 2013-06-04: .3 mg via INTRAVENOUS
  Administered 2013-06-04: 0.5 mg via INTRAVENOUS
  Administered 2013-06-04: .2 mg via INTRAVENOUS

## 2013-06-04 MED ORDER — ACETAMINOPHEN 325 MG PO TABS
325.0000 mg | ORAL_TABLET | ORAL | Status: DC | PRN
Start: 2013-06-04 — End: 2013-06-06

## 2013-06-04 MED ORDER — VANCOMYCIN 1000 MG IN 250 ML NS IVPB VIAL-MATE (CNR)
1.0000 g | Freq: Two times a day (BID) | INTRAVENOUS | Status: AC
Start: 2013-06-05 — End: 2013-06-06
  Administered 2013-06-05 – 2013-06-06 (×4): 1000 mg via INTRAVENOUS
  Filled 2013-06-04 (×4): qty 250

## 2013-06-04 MED ORDER — HYDROMORPHONE HCL PF 1 MG/ML IJ SOLN
INTRAMUSCULAR | Status: AC
Start: 2013-06-04 — End: 2013-06-04
  Administered 2013-06-04: 0.5 mg via INTRAVENOUS
  Filled 2013-06-04: qty 1

## 2013-06-04 MED ORDER — NALOXONE HCL 0.4 MG/ML IJ SOLN
0.4000 mg | INTRAMUSCULAR | Status: DC | PRN
Start: 2013-06-04 — End: 2013-06-06

## 2013-06-04 MED ORDER — FENTANYL CITRATE 0.05 MG/ML IJ SOLN
INTRAMUSCULAR | Status: AC
Start: 2013-06-04 — End: ?
  Filled 2013-06-04: qty 2

## 2013-06-04 MED ORDER — SENNOSIDES-DOCUSATE SODIUM 8.6-50 MG PO TABS
2.0000 | ORAL_TABLET | Freq: Every evening | ORAL | Status: DC | PRN
Start: 2013-06-04 — End: 2013-06-06

## 2013-06-04 MED ORDER — VANCOMYCIN 1000 MG IN 250 ML NS IVPB VIAL-MATE (CNR)
INTRAVENOUS | Status: AC
Start: 2013-06-04 — End: ?
  Filled 2013-06-04: qty 250

## 2013-06-04 MED ORDER — FENTANYL CITRATE 0.05 MG/ML IJ SOLN
INTRAMUSCULAR | Status: DC | PRN
Start: 2013-06-04 — End: 2013-06-04
  Administered 2013-06-04: 75 ug via INTRAVENOUS
  Administered 2013-06-04: 25 ug via INTRAVENOUS
  Administered 2013-06-04 (×2): 50 ug via INTRAVENOUS

## 2013-06-04 MED ORDER — PROMETHAZINE HCL 25 MG/ML IJ SOLN
6.2500 mg | Freq: Once | INTRAMUSCULAR | Status: DC | PRN
Start: 2013-06-04 — End: 2013-06-04

## 2013-06-04 MED ORDER — OXYCODONE-ACETAMINOPHEN 5-325 MG PO TABS
1.0000 | ORAL_TABLET | Freq: Once | ORAL | Status: AC | PRN
Start: 2013-06-04 — End: 2013-06-04

## 2013-06-04 MED ORDER — HYDROMORPHONE HCL PF 1 MG/ML IJ SOLN
1.0000 mg | INTRAMUSCULAR | Status: DC | PRN
Start: 2013-06-04 — End: 2013-06-06
  Administered 2013-06-04 – 2013-06-06 (×9): 1 mg via INTRAVENOUS
  Filled 2013-06-04 (×9): qty 1

## 2013-06-04 MED ORDER — ONDANSETRON HCL 4 MG/2ML IJ SOLN
INTRAMUSCULAR | Status: DC | PRN
Start: 2013-06-04 — End: 2013-06-04
  Administered 2013-06-04: 4 mg via INTRAVENOUS

## 2013-06-04 MED ORDER — EPHEDRINE SULFATE 50 MG/ML IJ SOLN
INTRAMUSCULAR | Status: DC | PRN
Start: 2013-06-04 — End: 2013-06-04
  Administered 2013-06-04: 16:00:00 10 mg via INTRAVENOUS
  Administered 2013-06-04: 16:00:00 5 mg via INTRAVENOUS

## 2013-06-04 MED ORDER — DOCUSATE SODIUM 100 MG PO CAPS
100.0000 mg | ORAL_CAPSULE | Freq: Two times a day (BID) | ORAL | Status: DC
Start: 2013-06-04 — End: 2013-06-06
  Administered 2013-06-04 – 2013-06-06 (×5): 100 mg via ORAL
  Filled 2013-06-04 (×5): qty 1

## 2013-06-04 MED ORDER — ONDANSETRON HCL 4 MG/2ML IJ SOLN
4.0000 mg | Freq: Once | INTRAMUSCULAR | Status: DC | PRN
Start: 2013-06-04 — End: 2013-06-04

## 2013-06-04 MED ORDER — ACETAMINOPHEN 325 MG PO TABS
650.0000 mg | ORAL_TABLET | Freq: Four times a day (QID) | ORAL | Status: DC | PRN
Start: 2013-06-04 — End: 2013-06-06

## 2013-06-04 MED ORDER — OXYCODONE-ACETAMINOPHEN 5-325 MG PO TABS
1.0000 | ORAL_TABLET | ORAL | Status: DC | PRN
Start: 2013-06-04 — End: 2013-06-06

## 2013-06-04 MED ORDER — ONDANSETRON HCL 4 MG/2ML IJ SOLN
4.0000 mg | Freq: Every day | INTRAMUSCULAR | Status: DC | PRN
Start: 2013-06-04 — End: 2013-06-06

## 2013-06-04 SURGICAL SUPPLY — 54 items
BAG MINIGRIP 2ML 6X9 CLEAR (Procedure Accessories) IMPLANT
BANDAGE CMPR PLSTR CTTN CRTY CNFRM 75X3 (Bandage) IMPLANT
BANDAGE CMPR PLSTR CTTN CRTY CNFRM 75X4 (Bandage)
BANDAGE CMPR PLSTR CTTN PRCR 5YDX4IN LF (Procedure Accessories)
BANDAGE CMPR PLSTR CTTN PRCR 5YDX6IN LF (Procedure Accessories)
BANDAGE CURITY CONFORM COMPRESSION L75 IN X W4 IN 1 PLY HIGH ABSORBENT (Bandage) IMPLANT
BANDAGE PROCARE COMP L5 YD X W4 IN 2 CLIP FASTENER SLF CLSR PLSTR CTTN (Procedure Accessories) IMPLANT
BANDAGE PROCARE COMPRESSION L5 YD X W4 (Procedure Accessories)
BANDAGE PROCARE COMPRESSION L5 YD X W6 (Procedure Accessories)
BANDAGE PROCARE COMPRESSION L5 YD X W6 IN 2 CLIP FASTENER BREATHABLE (Procedure Accessories) IMPLANT
CONTAINER SPEC 8OZ NS SNPON LID TRNLU (Suction) IMPLANT
DRAPE 3/4 SHEET FANFLD 52X76IN (Drape) ×4 IMPLANT
DRAPE SRG TBRN CNVRT 108X77IN STRL REINF (Drape)
DRAPE SURGICAL REINFORCE SPLIT (Drape)
DRAPE SURGICAL REINFORCE SPLIT IMPERVIOUS L108 IN X W77 IN CONVERTORS (Drape) IMPLANT
DRESSING FLEXZAN 4X4 (Dressing) ×2 IMPLANT
DRESSING PETRO GZE VSLN 18X3IN LF STRL (Dressing) IMPLANT
DRESSING TEGADERM 2X2 3/4 (Dressing) ×2 IMPLANT
GAUZE KERLIX 4.5X4YDS (Dressing) IMPLANT
GLOVE SURG BIOGEL INDIC SZ 8.5 (Glove) ×2 IMPLANT
GLOVE SURG BIOGEL ORTHO SZ8.5 (Glove) ×4 IMPLANT
INACTIVE USE LAWSON 93583 (Gown) ×2 IMPLANT
IRRIGATOR SUCTN PUMP/HANDPIECE (Suction) IMPLANT
NEEDLE REG BEVEL 19GX1.5IN (Needles) IMPLANT
PAD ARMBOARD 20X8X2IN (Procedure Accessories) ×4 IMPLANT
PAD ELECTROSRG GRND REM W CRD (Procedure Accessories) ×2 IMPLANT
PADDING CAST L4 YD X W4 IN COHESION HAND TEARABLE SELF BOND SPECIALIST (Procedure Accessories) IMPLANT
PADDING CST CTTN SPCLST 100 4YDX4IN LF (Procedure Accessories)
ROLL COTTON STERILE (Dressing) IMPLANT
SOLUTION IRR 0.9% NACL 1000ML LF STRL (Irrigation Solutions) ×1
SOLUTION IRRIGATION 0.9% SODIUM CHLORIDE (Irrigation Solutions) ×1
SOLUTION IRRIGATION 0.9% SODIUM CHLORIDE 1000 ML PLASTIC POUR BOTTLE (Irrigation Solutions) ×1 IMPLANT
SPONGE CHLRPRP TINT 26ML (Applicator) ×2 IMPLANT
SPONGE GAUZE 12PLY STRL 4X4IN (Sponge) ×4 IMPLANT
SPONGE LAP 18X18IN PREWASH WHT (Sponge)
SPONGE LAPAROTOMY L18 IN X W18 IN (Sponge)
SPONGE LAPAROTOMY L18 IN X W18 IN PREWASH WHITE (Sponge) IMPLANT
STAPLER SKIN REG (Skin Closure) IMPLANT
SUTURE ABS 3-0 SH VCL 27IN BRD COAT VIOL (Suture)
SUTURE COATED VICRYL 3-0 SH L27 IN BRAID (Suture)
SUTURE COATED VICRYL 3-0 SH L27 IN BRAID COATED VIOLET ABSORBABLE (Suture) IMPLANT
SUTURE ETHILON BLACK 3-0 FSL L30 IN (Suture) IMPLANT
SUTURE NABSB 3-0 FSL ETH 30IN MFL BLK (Suture)
SUTURE VICRYL 2-0 CT-2 (Suture) IMPLANT
SYRINGE 10CC COLEMN LUER LOCK (Syringes, Needles) IMPLANT
TAPE MICROPORE SURGICAL 3INX10YDS (Tape)
TAPE SURGICAL L10 YD X W3 IN STANDARD (Tape)
TAPE SURGICAL L10 YD X W3 IN STANDARD HYPOALLERGENIC BREATHABLE (Tape) IMPLANT
TOURNIQUET 34IN STRL (Procedure Accessories) IMPLANT
TOWEL STERILE REUSABLE 8PK (Procedure Accessories) ×2 IMPLANT
TUBE CULTURE STURTS LIQ BBL (Procedure Accessories) IMPLANT
TUBING CONNECTING STERILE 10FT (Tubing)
TUBING SUCTION ID3/16 IN L10 FT (Tubing)
TUBING SUCTION ID3/16 IN L10 FT NONCONDUCTIVE STRAIGHT MALE FEMALE (Tubing) IMPLANT

## 2013-06-04 NOTE — PACU (Signed)
MD Ambardar at bedside. NKO at this time.

## 2013-06-04 NOTE — Addendum Note (Signed)
Addendum  created 06/04/13 1826 by Orpah Greek, MD    Modules edited:Notes Section

## 2013-06-04 NOTE — H&P (Signed)
ORTHO ADMISSION HISTORY AND PHYSICAL EXAM    Date Time: 06/04/2013 2:27 PM  Patient Name: [name]  Attending Physician: Chinita Greenland III*    Assessment:   Infected right shoulder S/P right shoulder mini open rotator cuff repair    Plan:   I&D Right shoulder    History of Present Illness:   [name] is a 53 y.o. male who presents to the hospital with infected right shoulder, here today for surgery.    Past Medical History:     Past Medical History   Diagnosis Date   . Hypertension        Past Surgical History:     Past Surgical History   Procedure Date   . Splenectomy    . Spinal fusion      L5-S1       Family History:   History reviewed. No pertinent family history.    Social History:     History     Social History   . Marital Status: Single     Spouse Name: N/A     Number of Children: N/A   . Years of Education: N/A     Social History Main Topics   . Smoking status: Never Smoker    . Smokeless tobacco: Current User     Types: Snuff   . Alcohol Use: Yes   . Drug Use: No   . Sexually Active: Not on file     Other Topics Concern   . Not on file     Social History Narrative   . No narrative on file       Allergies:   No Known Allergies    Medications:     Prescriptions prior to admission   Medication Sig   . acetaminophen (TYLENOL) 325 MG tablet Take 2 tablets (650 mg total) by mouth every 6 (six) hours as needed.   . ciprofloxacin (CIPRO) 750 MG tablet Take 750 mg by mouth 2 (two) times daily.   Marland Kitchen olmesartan-hydrochlorothiazide (BENICAR HCT) 20-12.5 MG per tablet Take 1 tablet by mouth daily.   Marland Kitchen oxyCODONE-acetaminophen (PERCOCET) 5-325 MG per tablet Take 1 tablet by mouth every 6 (six) hours as needed.       Review of Systems:   Psychological ROS: negative  Respiratory ROS: no cough, shortness of breath, or wheezing  Cardiovascular ROS: no chest pain or dyspnea on exertion  Musculoskeletal ROS: pos pain right shoulder.  Neurological ROS: denies numbness or tingling     Physical Exam:     Filed  Vitals:    06/04/13 1245   BP: 162/77   Pulse: 95   Temp: 99.4 F (37.4 C)   Resp: 18   SpO2: 96%       General: AOx3  Heart: RRR  Lungs: CTA bilaterally  Ext: pulses 2+ bilaterally  Neuro: neurovascularly intact        Labs:     Results     ** No Results found for the last 24 hours. **            Rads:   none    Signed by: Vernie Shanks

## 2013-06-04 NOTE — Anesthesia Preprocedure Evaluation (Addendum)
Anesthesia Evaluation    AIRWAY    Mallampati: II    TM distance: >3 FB  Neck ROM: full  Mouth Opening:full   CARDIOVASCULAR    cardiovascular exam normal       DENTAL    No notable dental hx     PULMONARY    pulmonary exam normal     OTHER FINDINGS                  Pre-evaluation Note Incomplete - DO NOT USE FOR CLINICAL DECISIONS    Anesthesia Plan    ASA 2     general                     Detailed anesthesia plan: general endotracheal and PNB            informed consent obtained    Plan discussed with CRNA.                   Inpatient Anesthesia Evaluation    Patient Name: ZHY,QMVHQIO R  Surgeon: Estevan Oaks, MD  Patient Age / Sex: 53 y.o. / male    Medical History:     Past Medical History   Diagnosis Date   . Hypertension        Past Surgical History   Procedure Date   . Splenectomy    . Spinal fusion      L5-S1         Allergies:   No Known Allergies      Medications:     No current facility-administered medications for this visit.     Facility-Administered Medications Ordered in Other Visits   Medication Dose Route Frequency Last Rate Last Dose   . lactated ringers infusion   Intravenous Continuous                  Prior to Admission medications    Medication Sig Start Date End Date Taking? Authorizing Provider   acetaminophen (TYLENOL) 325 MG tablet Take 2 tablets (650 mg total) by mouth every 6 (six) hours as needed. 08/03/12   Gwyndolyn Kaufman, MD   ciprofloxacin (CIPRO) 750 MG tablet Take 750 mg by mouth 2 (two) times daily.    [provider]   olmesartan-hydrochlorothiazide (BENICAR HCT) 20-12.5 MG per tablet Take 1 tablet by mouth daily.    [provider]   oxyCODONE-acetaminophen (PERCOCET) 5-325 MG per tablet Take 1 tablet by mouth every 6 (six) hours as needed. 08/03/12   Chinery, Myra Gianotti, MD     Vitals   Temp:  [37.4 C (99.4 F)] 37.4 C (99.4 F)  Heart Rate:  [95] 95   Resp Rate:  [18] 18   BP: (162)/(77) 162/77 mmHg    Wt Readings from Last 3 Encounters:   06/04/13  96.3 kg (212 lb 4.9 oz)   06/04/13 96.3 kg (212 lb 4.9 oz)   07/30/12 102.967 kg (227 lb)     BMI (Estimated Body mass index is 28.79 kg/(m^2) as calculated from the following:    Height as of an earlier encounter on 06/04/13: 6\' 0" (1.829 m).    Weight as of an earlier encounter on 06/04/13: 212 lb 4.9 oz(96.3 kg).)  Temp Readings from Last 3 Encounters:   06/04/13 37.4 C (99.4 F) Temporal Artery   06/04/13 37.4 C (99.4 F) Temporal Artery   08/03/12 36.5 C (97.7 F) Oral     BP Readings from Last 3  Encounters:   06/04/13 162/77   06/04/13 162/77   08/03/12 163/90     Pulse Readings from Last 3 Encounters:   06/04/13 95   06/04/13 95   08/03/12 73           Labs:   CBC:  Lab Results   Component Value Date    WBC 13.85* 06/02/2013    HGB 15.6 06/02/2013    HCT 46.6 06/02/2013    PLT 420* 06/02/2013       Chemistries:  Lab Results   Component Value Date    NA 141 06/02/2013    K 4.9 06/02/2013    CL 99* 06/02/2013    CO2 28 06/02/2013    BUN 14.0 06/02/2013    CREAT 1.1 06/02/2013    GLU 78 06/02/2013    CA 9.4 06/02/2013       Coags:  Lab Results   Component Value Date    PT 15.0 08/07/2010    PTT 30 08/07/2010    INR 1.2* 08/07/2010     _____________________      Signed by: Nunzio Cobbs  06/04/2013   1:23 PM

## 2013-06-04 NOTE — Transfer of Care (Signed)
Anesthesia Transfer of Care Note    Patient: [name]    Procedures performed: Procedure(s) with comments:  DEBRIDEMENT & IRRIGATION, UPPER EXTREMITY - RIGHT SHOULDER I&D; REPAIR ACUTE ROTATOR CUFF TEAR    Anesthesia type: General LMA    Patient location:Phase I PACU    Last vitals:   Filed Vitals:    06/04/13 1655   BP: 162/101   Pulse: 86   Temp: 36.6 C (97.9 F)   Resp: 16   SpO2: 100%       Post pain: Continue adjustment of pain medication;      Mental Status:awake and alert     Respiratory Function: tolerating face mask    Cardiovascular: stable    Nausea/Vomiting: patient not complaining of nausea or vomiting    Hydration Status: adequate    Post assessment: no apparent anesthetic complications and no reportable events  Bilateral lung sound is clear. Report is given to PACU RN.
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
## 2013-06-04 NOTE — H&P (Signed)
Pt examined and H+P from [facility] - [place] - reviewed and no significant changes noted.

## 2013-06-04 NOTE — Anesthesia Postprocedure Evaluation (Addendum)
Anesthesia Post Evaluation    Patient: [name]    Procedures performed: Procedure(s) with comments:  DEBRIDEMENT & IRRIGATION, UPPER EXTREMITY - RIGHT SHOULDER I&D; REPAIR ACUTE ROTATOR CUFF TEAR    Anesthesia type: General LMA    Patient location:PACU    Last vitals:   Filed Vitals:    06/04/13 1820   BP: 165/65   Pulse: 100   Temp:    Resp: 16   SpO2: 96%       Post pain: Patient not complaining of pain, continue current therapy      Mental Status:awake and alert     Respiratory Function: tolerating nasal cannula    Cardiovascular: stable    Nausea/Vomiting: patient not complaining of nausea or vomiting    Hydration Status: adequate    Post assessment: no apparent anesthetic complications, no reportable events and no evidence of recall

## 2013-06-05 ENCOUNTER — Encounter: Payer: Self-pay | Admitting: Orthopaedic Surgery

## 2013-06-05 LAB — CBC AND DIFFERENTIAL
Basophils Absolute Automated: 0.03 10*3/uL (ref 0.00–0.20)
Basophils Automated: 0 %
Eosinophils Absolute Automated: 0.19 10*3/uL (ref 0.00–0.70)
Eosinophils Automated: 1 %
Hematocrit: 44.3 % (ref 42.0–52.0)
Hgb: 14.7 g/dL (ref 13.0–17.0)
Immature Granulocytes Absolute: 0.08 10*3/uL — ABNORMAL HIGH
Immature Granulocytes: 1 %
Lymphocytes Absolute Automated: 2.97 10*3/uL (ref 0.50–4.40)
Lymphocytes Automated: 22 %
MCH: 32.4 pg — ABNORMAL HIGH (ref 28.0–32.0)
MCHC: 33.2 g/dL (ref 32.0–36.0)
MCV: 97.6 fL (ref 80.0–100.0)
MPV: 10 fL (ref 9.4–12.3)
Monocytes Absolute Automated: 1.3 10*3/uL — ABNORMAL HIGH (ref 0.00–1.20)
Monocytes: 10 %
Neutrophils Absolute: 8.66 10*3/uL — ABNORMAL HIGH (ref 1.80–8.10)
Neutrophils: 66 %
Nucleated RBC: 0 /100 WBC (ref 0–1)
Platelets: 448 10*3/uL — ABNORMAL HIGH (ref 140–400)
RBC: 4.54 10*6/uL — ABNORMAL LOW (ref 4.70–6.00)
RDW: 15 % (ref 12–15)
WBC: 13.23 10*3/uL — ABNORMAL HIGH (ref 3.50–10.80)

## 2013-06-05 LAB — SEDIMENTATION RATE: Sed Rate: 11 mm/Hr — ABNORMAL HIGH (ref 0–10)

## 2013-06-05 LAB — BASIC METABOLIC PANEL
BUN: 16 mg/dL (ref 9.0–28.0)
CO2: 25 mEq/L (ref 22–29)
Calcium: 8.4 mg/dL — ABNORMAL LOW (ref 8.5–10.5)
Chloride: 105 mEq/L (ref 100–111)
Creatinine: 1.3 mg/dL (ref 0.7–1.3)
Glucose: 87 mg/dL (ref 70–100)
Potassium: 4.1 mEq/L (ref 3.5–5.1)
Sodium: 136 mEq/L (ref 136–145)

## 2013-06-05 LAB — C-REACTIVE PROTEIN: C-Reactive Protein: 1.1 mg/dL — ABNORMAL HIGH (ref 0.0–0.8)

## 2013-06-05 LAB — GFR: EGFR: 58

## 2013-06-05 MED ORDER — SODIUM CHLORIDE 0.9 % IV MBP
1000.0000 mg | INTRAVENOUS | Status: DC
Start: 2013-06-05 — End: 2013-06-06
  Administered 2013-06-05 – 2013-06-06 (×2): 1000 mg via INTRAVENOUS
  Filled 2013-06-05: qty 50
  Filled 2013-06-05 (×2): qty 1000

## 2013-06-05 NOTE — Progress Notes (Signed)
Receiving pt from PACU to ST8 room 2. Pt A&O*4. Pain control with IV dilaudid and percocet. Dsg d/i. Hemovac at the right shoulder noted. Arm sling in place. Voided to urinal without difficulty. No c/o of nausea and vomiting. Tolerated food well. SCD BLE. Call bell in reach. Oriented to united.

## 2013-06-05 NOTE — OT Eval Note (Signed)
[facility]   Occupational Therapy Evaluation/Discharge    Patient: [name]    MRN#: [id number]   Unit: [facility]  Bed: F-8/.02                                     Discharge Recommendations:        Home with no needs. Assistance for higher level adls as needed via family    DME-no needs from OT standpoint      Assessment:   [name] is a 53 y.o. male admitted 06/04/2013 .  Pt presents at/near functional baseline, independent with basic ADLs.  No acute OT needs identified. D/C acute OT services.    Therapy Diagnosis: decreased adl Independence    Rehabilitation Potential: good    Treatment Activities: Evaluation , adl re training-UB dressing  Educated the patient to role of occupational therapy, plan of care, goals of therapy and HEP, safety with mobility and ADLs.    Plan:   D/C acute OT services     Treatment/Interventions: completed with dressing and functional transfers    Risks/benefits/POC discussed with patient       Precautions and Contraindications:   Falls  Sling R UE    Consult received for Milinda Pointer for OT Evaluation and Treatment.  Patient's medical condition is appropriate for Occupational Therapy intervention at this time.      History of Present Illness:    [name] is a 53 y.o. male admitted on 06/04/2013 with Infected R Shoulder    S/P I&D R Shoulder for Rotator Cuff Repair; 06/04/13    Admitting Diagnosis: Cellulitis of right upper arm [682.3] (RIGHT SHOULDER INFECTION)  Unspecified infection of bone, shoulder region [730.91] (Unspecified infection of bone, shoulder region)    Past Medical/Surgical History:   Date    .  Hypertension       Past Surgical History:      Past Surgical History    Procedure  Date    .  Splenectomy     .  Spinal fusion       L5-S1          Imaging/Tests/Labs:  reviewed  Social History:   Prior Level of Function:  independent  Assistive Devices: none  Baseline Activity: Independent  DME Currently at Home: none  Home Living  Arrangements: home with nephew  Type of Home: house    Subjective:'I hope to leave soon "    Patient is agreeable to participation in the therapy session.     Patient Goal: home  Pain:   Scale: 6/10  Location: R SHOULDER  Intervention: Nsg notified    Objective:   Patient received  in bed with sling in place.    Cognitive Status and Neuro Exam:  Intact    Musculoskeletal Examination  Gross ROM: WFL except R UE  Gross Strength: WFL      Sensory/Oculomotor Examination  Auditory: WFL  Tactile: WFL  Vision: WFL      Activities of Daily Living  Eating:ISET UP  Grooming:SET UP  Bathing: CGA  UE Dressing: CGA  LE Dressing: Independent  Toileting: Independent    Functional Mobility:  Supine to Sit: Independent  Sit to Stand: Independent  Transfers: Independent     Balance  Static Sitting: good  Dynamic Sitting: good  Static Standing:good  Dynamic Standing: good    Participation and Activity  Tolerance  Participation Effort: good  Endurance: good    Patient left with call bell within reach, all needs met and all questions answered, RN notified of session outcome and patient response.                                               Time of treatment:   OT Received On: 06/05/13  Start Time: 1030  Stop Time: 1105  Time Calculation (min): 35 min      Tora Perches OTR/L  Pager [phone]

## 2013-06-05 NOTE — Progress Notes (Signed)
Post OP Day 1 Day Post-Op for Procedure(s):  DEBRIDEMENT & IRRIGATION, UPPER EXTREMITY     Patient without significant complaints.  Pain reasonably well controlled. Denies N/V.    Filed Vitals:    06/05/13 0558   BP: 147/76   Pulse: 73   Temp: 96.2 F (35.7 C)   Resp: 16   SpO2: 95%       Intake/Output Summary (Last 24 hours) at 06/05/13 0750  Last data filed at 06/05/13 0600   Gross per 24 hour   Intake   3400 ml   Output   1152 ml   Net   2248 ml       Exam:    Awake and alert.  Oriented X 3 in NAD. Resting comfortably in bed with slingshot in place.    Dressing clean and dry.Changed today- new bandage C/D/I    Drain will small amount serosanguinous fluid- pulled today and steri-strips applied       Lab 06/05/13 0420   WBC 13.23*   HGB 14.7   HCT 44.3         Lab 06/05/13 0420   NA 136   K 4.1   CL 105   CO2 25   BUN 16.0   CREAT 1.3   GLU 87   CA 8.4*           Assessment:    Satisfactory post-op course status post Procedure(s):  DEBRIDEMENT & IRRIGATION, UPPER EXTREMITY     Plan:    Continue per clinical pathway for Procedure(s):  DEBRIDEMENT & IRRIGATION, UPPER EXTREMITY     Drain pulled this AM and new dressing applied. Patient instructed to keep slingshot in place at all times. No ROM away from body.    Will follow ID recommendations regarding antibiotic management and pending cultures taken in office and intra op.

## 2013-06-05 NOTE — Progress Notes (Signed)
Pt resting in bed at this time, intact neurovascular status, OOb and ambulates well, pain under control with oral pain med, dressing to R shoulder C/D/I/, good appetite, sling on at all times.

## 2013-06-05 NOTE — Procedures (Addendum)
PICC Line Insertion Procedure Note    Date of Insertion:  06/05/2013    Indications:  IV Access for ABX     NOTE: Blood culture was pending but ID team said PICC can be placed today per RN. Pt is afebrile and pending discharge tomorrow.    Procedure Details   Informed consent was obtained for the procedure.  Risks and Benefits were explained. The importance of CHG cloth daily bath was discussed. The Procedure Boarding Pass were done with Unit RN:    Maximum sterile technique was used including antiseptics, cap, gloves, gown, hand hygiene, mask and full drape.     After 1% lidocaine anesthesia, puncture of the vein was performed with a 21-gauge single-wall needle under direct sonographic guidance. Guidewire was advanced and the needle was removed and a peel-away sheath was placed. The guide wire was removed with tip intact.The catheter was then trimmed and advanced through the peel-away sheath. The sheath was removed. The stylet was removed with tip intact and the catheter was flushed and capped. Antimicrobial disc (Biopatch) was applied correctly. Sterile Transparent Occlusive Dressing applied using aseptic technique. Insertion date is labeled. PICC inserted per hospital protocol. Blood return: Yes.  Patient tolerated the procedure well. No complaints. (# of attempts = 1)    Findings:  PICC is inserted as described above. SVC Tip Verification is done with ecg.  PICC is ready for use PICC Brochure given to patient/family with teaching instruction.                   Cathter was inserted with 4 FR SL POWER         Into LT Basilic vein, Total Lengh  40 cm, with 0 cm. Exposed.          Mid upper arm circumference is 40 cm.           PICC Reference #:[id number]         PICC Kit Lot #: [id number]          PICC Kit expiration date: 2016        Renee Harder. RN/CRNI
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
## 2013-06-05 NOTE — Progress Notes (Signed)
Sw called Bradly Chris at financial office and informed that copy of insurance is in Media section.

## 2013-06-05 NOTE — PT Progress Note (Signed)
[facility]   Physical Therapy Cancellation Note      Patient:  [name] MRN#:  [id number]  Unit:  [facility] Room/Bed:  F-8/.02    06/05/2013  Time: 11:16 AM        Pt not seen for physical therapy secondary to per OT evaluation, pt is independent with mobility. No skilled PT indicated at this time. D/C PT.    Thank you,  Gifford Shave, PT [phone]

## 2013-06-05 NOTE — Op Note (Signed)
Procedure Date: 06/04/2013     Patient Type: V     SURGEON: Blanchard Kelch MD  ASSISTANT:  Vernie Shanks PA     PREOPERATIVE DIAGNOSES:  Recurrent tear, rotator cuff tear, right shoulder, new injury with  infection, right shoulder.     POSTOPERATIVE DIAGNOSES:  Recurrent tear, rotator cuff tear, right shoulder, new injury with  infection, right shoulder.     TITLE OF PROCEDURE:    Incision, drainage, and debridement of right shoulder repair of recurrent  rotator cuff tear, new injury.     ANESTHESIA:     ESTIMATED BLOOD LOSS:     NOTE:  Prior to the procedure, the risks, benefits, and alternatives had been  discussed.  The patient admitted to having been very active despite being  in a sling and advised to keep his arm abducted.  Apparently, there was  driving, lifting and some other activities that were continued.     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  He continued to inject these steroids and apparently  it may be a source of seeding an infected shoulder.     When I saw him in the office, he had drainage in the shoulder.  This was  cultured and then he was started on Cipro as an interim, antibiotic.   Today, he was taken to the operating room as outlined below and treated.   Details are found in the summary outlined.     There was a new injury sustained and he ruptured the rotator cuff.  It was  repaired as outlined below.     Ms. Irish Lack assisted through all aspects of the procedure.  Her help  was absolutely mandatory and greatly appreciated through all phases of the  procedure outlined below.     DESCRIPTION OF PROCEDURE:  The patient was placed supine on the operating room table in a semi-sitting  position on a beach chair.  Right shoulder showed some drainage. He had  considerable drainage in the office which was evacuated as much as  possible; some persisted in the shoulder.  The shoulder was prepped with  ChloraPrep and edges excised from  previous incision site.     Once this was excised continuing collection of pustular drainage was  identified.     This was irrigated copiously with Pulsavac, 3 liters of bacitracin solution  was used to clean the area.  A large amount had already been evacuated, but  this completely irrigated the joint.  This was done as extensively as  possible.  Any dead tissue was debrided.     It was noted that he had torn his rotator cuff again.  A very extensive  repair had been done.  This had ruptured.  This had been significantly  disrupted, previous sutures were removed as they had been torn and broken  and the rotator cuff had been torn in that area.  The cuff was carefully  debrided and the biceps tendon was not well visualized.  This is in the  field, but very difficult to retrieve and will be left as a tenotomy if in  fact it has ruptured because of the extent of the infection and the  difficulty in repair.  No new foreign material will be placed into the  shoulder other than sutures.  No anchors were utilized because of the  nature of the infection.     The cuff was mobilized  and multiple figure-of-eight #1 Ethibond sutures  were used to reapproximate the cuff.  Once this was completed, the cuff  actually was restored again.  With the arm in abduction the cuff was closed  completely.  The remnant of the biceps tendon appeared to be incorporated  into this and hopefully this will hold.  Wounds were irrigated and inverted  interrupted #1 Vicryl suture was used for the deltopectoral fascia, which  had been opened and the deltoid reattached to the anterior aspect of the  acromion and clavicle where this had been debrided in the past.  He had  ruptured this prior to this procedure and I am sure there was significant  activity involved in this and he admitted to this.  He was also advised of  the risks of injecting steroid material, not only it compromise his immune  system, but it is impossible to tell what is in the  material that he is  injecting and this could easily infect his shoulder.  This was very  carefully explained to him.     The wounds were closed over a drain.  Drain was brought out through a  separate lateral stab wound.  Inverted interrupted #1 Vicryl suture was  used for the deeper layers and #2-0 nylon were used interrupted simple  fashion to close the skin.  Dry dressing was applied.  The patient was  cultured twice and then vancomycin started.     The patient had already been on Cipro and seemed to actually be quieting  down the incision somewhat, but vancomycin will be utilized until  infectious disease can get involved and see what they recommend.  Irish Lack, my first assistant and PA will contact infectious disease and  make certain that that is arranged.     Family members and friends were notified of the findings and the need to be  compliant.  The patient was placed in an abduction sling, will be admitted,  will wait for antibiotic recommendations and then proceed from there.  No  complications were encountered.     Again, the procedures incision, drainage, debridement, right shoulder,  repair of acute recurrent rotator cuff tear, right shoulder.           D:  06/04/2013 16:41 PM by Dr. Blanchard Kelch, MD [phone])  T:  06/04/2013 23:35 PM by [id number]      Everlean Cherry: [id number]) (Doc ID: [id number])

## 2013-06-06 DIAGNOSIS — IMO0002 Reserved for concepts with insufficient information to code with codable children: Secondary | ICD-10-CM

## 2013-06-06 DIAGNOSIS — M869 Osteomyelitis, unspecified: Secondary | ICD-10-CM

## 2013-06-06 LAB — CBC AND DIFFERENTIAL
Basophils Absolute Automated: 0.04 10*3/uL (ref 0.00–0.20)
Basophils Automated: 0 %
Eosinophils Absolute Automated: 0.37 10*3/uL (ref 0.00–0.70)
Eosinophils Automated: 2 %
Hematocrit: 45.6 % (ref 42.0–52.0)
Hgb: 15.3 g/dL (ref 13.0–17.0)
Immature Granulocytes Absolute: 0.1 10*3/uL — ABNORMAL HIGH
Immature Granulocytes: 1 %
Lymphocytes Absolute Automated: 2.26 10*3/uL (ref 0.50–4.40)
Lymphocytes Automated: 15 %
MCH: 32.6 pg — ABNORMAL HIGH (ref 28.0–32.0)
MCHC: 33.6 g/dL (ref 32.0–36.0)
MCV: 97.2 fL (ref 80.0–100.0)
MPV: 10 fL (ref 9.4–12.3)
Monocytes Absolute Automated: 1.79 10*3/uL — ABNORMAL HIGH (ref 0.00–1.20)
Monocytes: 12 %
Neutrophils Absolute: 10.09 10*3/uL — ABNORMAL HIGH (ref 1.80–8.10)
Neutrophils: 69 %
Nucleated RBC: 0 /100 WBC (ref 0–1)
Platelets: 452 10*3/uL — ABNORMAL HIGH (ref 140–400)
RBC: 4.69 10*6/uL — ABNORMAL LOW (ref 4.70–6.00)
RDW: 15 % (ref 12–15)
WBC: 14.65 10*3/uL — ABNORMAL HIGH (ref 3.50–10.80)

## 2013-06-06 LAB — GFR: EGFR: >60

## 2013-06-06 LAB — BASIC METABOLIC PANEL
BUN: 12 mg/dL (ref 9.0–28.0)
CO2: 26 mEq/L (ref 22–29)
Calcium: 8.7 mg/dL (ref 8.5–10.5)
Chloride: 105 mEq/L (ref 100–111)
Creatinine: 1.1 mg/dL (ref 0.7–1.3)
Glucose: 88 mg/dL (ref 70–100)
Potassium: 4.5 mEq/L (ref 3.5–5.1)
Sodium: 137 mEq/L (ref 136–145)

## 2013-06-06 MED ORDER — OXYCODONE-ACETAMINOPHEN 5-325 MG PO TABS
ORAL_TABLET | ORAL | Status: DC
Start: 2013-06-06 — End: 2014-11-06

## 2013-06-06 MED ORDER — ASPIRIN 325 MG PO TABS
325.0000 mg | ORAL_TABLET | Freq: Two times a day (BID) | ORAL | Status: AC
Start: 2013-06-06 — End: 2013-06-20

## 2013-06-06 NOTE — Plan of Care (Signed)
Problem: Pain  Goal: Patient's pain/discomfort is manageable  Intervention: Offer non-pharmocological pain management interventions  Elevated on pillow. Sling on.

## 2013-06-06 NOTE — Progress Notes (Signed)
Afeb, wbc still elevated, cultures were taken in office prior to starting cipro and those results should be ready, Irish Lack PA was checking results, appreciate all help. Plan as per ID, continue abduction pillow to try to save cuff, NVI

## 2013-06-06 NOTE — Progress Notes (Addendum)
Post OP Day 2 Days Post-Op for Procedure(s):  DEBRIDEMENT & IRRIGATION, UPPER EXTREMITY     Patient without significant complaints.  Pain reasonably well controlled. Seen by ID today and plan is to discharge following face-to-face encounter.     Filed Vitals:    06/06/13 0628   BP: 174/77   Pulse: 89   Temp: 98.8 F (37.1 C)   Resp: 18   SpO2: 95%       Intake/Output Summary (Last 24 hours) at 06/06/13 0803  Last data filed at 06/06/13 0300   Gross per 24 hour   Intake      0 ml   Output   1600 ml   Net  -1600 ml       Exam:    Awake and alert.  Oriented X 3 in NAD. Resting comfortably in bed with slingshot in place.    Dressing clean and dry. Changed today- new bandage C/D/I      Lab 06/06/13 0334   WBC 14.65*   HGB 15.3   HCT 45.6         Lab 06/06/13 0334   NA 137   K 4.5   CL 105   CO2 26   BUN 12.0   CREAT 1.1   GLU 88   CA 8.7     Cultures take in office on Monday 06/02/13 before prophylactic antibiotics: no final results at this time. Preliminary results demonstrated no growth after 3 days. Will continue monitoring for results.      Assessment:    Satisfactory post-op course status post Procedure(s):  DEBRIDEMENT & IRRIGATION, UPPER EXTREMITY     Plan:    Continue per clinical pathway for Procedure(s):  DEBRIDEMENT & IRRIGATION, UPPER EXTREMITY     Dressing changed today. Instructions given to patient regarding slingshot use and bandage change at home.    Per ID notes, patient can be discharged home today with IV antibiotics. Will follow-up with ID as directed for pending cultures and further treatment.    Patient can follow-up with Dr.Lane in the office in 3 weeks for further evaluation and treatment of RTC repair.

## 2013-06-06 NOTE — Plan of Care (Signed)
Discharged to home given discharged instruction and instructed to patient. Follow- up with dr. As scheduled. Call for make appointment. PICC line flush well. Discharged by wheel- chair with assist.

## 2013-06-06 NOTE — Discharge Instructions (Addendum)
Aretta Nip III, M.D.  [phone]     POST OPERATIVE INSTRUCTIONS FOR SHOULDER SURGERY     1. REST, ICE AND ELEVATION    Lying flat in bed will be uncomfortable for several days.  Sleeping with your back elevated on several pillow can be helpful.  Apply ice to the shoulder intermittently for the first 24 hours.    2. EXERCISE    Open or Arthroscopic should surgery requires a sling for protection post-operatively.  Keep the sling in place until your surgeon sees you in the office.  You can remove the sling to allow the elbow, wrist, and hand motion only 3 times a day.  Do not raise your arm away from your side.        3. MEDICATION  Take the following medication as directed.  Percocet 1 or 2 tablets orally every 4-6 hours as needed for pain.             Pain Medication Tips:  -  Do not drive while taking pain medications.  -  Do not drink alcoholic beverages while taking pain medications.  -  Pain medication should be taken with food as this will help prevent any stomach upset.  - Often pain medications will cause constipation. Eat high fiber foods and increase your fluid intake if         possible.   - To alleviate constipation, purchase a stool softener at any pharmacy and follow the recommended      directions on the bottle.  - You should resume taking your normally prescribed medications unless otherwise directed.     4. DRESSING/SHOWERING    Open:    Slight bleeding through the dressing is common.  Excessive bleeding should be reported to your surgeon    Dressings must be kept dry.    Modify you bathing appropriately    Swimming or hot tubs are not allowed until the wound is completely healed, as judged by your surgeon.    5. QUESTIONS/CONCERNS  Your surgeon should be contacted by calling [phone] Option [facility] [place] or [phone] Option 3 [place]  24 hours a day for any of the following symptoms:    Fever greater than 101.5 degrees F    Difficulty breathing or shortness of breath    Numbness, loss of  good color or coolness in the arm    Severe pain unresponsive to narcotic medication    Excessive bleeding or vomiting     6. FOLLOW-UP  You should be scheduled for a follow-up appointment in 7 - 10 days.   Location _________________ Date ____________ Time ________  -CALL FOR APPOINTMENT. [place]- [phone]. [phone])      Home Health Discharge Information     Your doctor has ordered Skilled Nursing in-home service(s) for you while you recuperate at home, to assist you in the transition from hospital to home.      The agency that you or your representative chose to provide the service:  Name of Home Health Agency: [facility]-----[phone]]      The Infusion Company:   ]  Infusion: [facility]  Start on: Date:         The above services were set up by:  Merrily Brittle  [facility] Liaison)   Phone  Additional comments:       Signed by: Merrily Brittle  Date Time: 06/06/2013 11:03 AM

## 2013-06-06 NOTE — Progress Notes (Signed)
Home Health Referral          Referral from MIN (Case Manager) for home health care upon discharge.    By [employer], the patient has the right to freely choose a home care provider.  Arrangements have been made with:     A company of the patients choosing. We have supplied the patient with a listing of providers in your area who asked to be included and participate in Medicare.   [facility], a home care agency that provides both adult home care services which is a wholly owned and operated by [employer] and participates in [employer]   The preferred provider of your insurance company. Choosing a home care provider other than your insurance company's preferred provider may affect your insurance coverage.    The Home Health Care Referral Form acknowledging the voluntary selection of the home care company has been completed, signed, and is on file.      Home Health Discharge Information     Your doctor has ordered Skilled Nursing in-home service(s) for you while you recuperate at home, to assist you in the transition from hospital to home.      The agency that you or your representative chose to provide the service:  Name of Home Health Agency: [facility]-----[phone]]      The Infusion Company:   ]  Infusion: [facility]  Start on: Date:         The above services were set up by:  Merrily Brittle  [facility] Liaison)   Phone                                             Additional comments:       Signed by: Merrily Brittle  Date Time: 06/06/2013 11:03 AM
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
## 2013-06-06 NOTE — Progress Notes (Signed)
SW talked w/ Dr. Cherlyn Roberts on the phone. She stated that [facility] can start IV ABX tmr am arount 8am or 9am. SW informed it to [employer].
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
## 2013-06-06 NOTE — Progress Notes (Signed)
No acute change, pain controlled with 2 percocet prn, nvi, vss, RUE elevated in sling, drsg c/d/i, voiding, call bell and bedside table in reach, denies needs or questions, fall precautions in place.

## 2014-11-06 ENCOUNTER — Ambulatory Visit: Payer: No Typology Code available for payment source | Attending: Urology

## 2014-11-06 NOTE — Pre-Procedure Instructions (Addendum)
Pt was working on a high rise building during PST interview and very distracted with noise and other people talking to him at the same time. He did not know the name of his medications but will call back with the name of medications and an email address to send hibiclens instructions  Faxed surgeon for preop testing  Pt is aware he needs 2 negative MRSA cultures prior to sugery or he will be on contact isolation. He will try to get the cultures done

## 2014-11-11 NOTE — Pre-Procedure Instructions (Addendum)
Pt to obtain preops per surgeon within 2 weeks of surgery was urged to contact surgeon for followup NT swabs. Fiance had called to update med info for within 30 days per pt request. New med added see med rec  Full instructions reviewed with fiance and encouraged to call office for preop bowel prep as fiance said they did not have any . Faxed to surgeon for preops when available

## 2014-11-23 ENCOUNTER — Ambulatory Visit: Payer: No Typology Code available for payment source | Attending: Urology

## 2014-11-23 DIAGNOSIS — R972 Elevated prostate specific antigen [PSA]: Secondary | ICD-10-CM | POA: Insufficient documentation

## 2014-11-23 DIAGNOSIS — Z01810 Encounter for preprocedural cardiovascular examination: Secondary | ICD-10-CM

## 2014-11-23 LAB — BASIC METABOLIC PANEL
BUN: 18 mg/dL (ref 9.0–28.0)
CO2: 28 mEq/L (ref 21–30)
Calcium: 9.7 mg/dL (ref 8.5–10.5)
Chloride: 101 mEq/L (ref 100–111)
Creatinine: 1.4 mg/dL (ref 0.5–1.5)
Glucose: 74 mg/dL (ref 70–100)
Potassium: 4.3 mEq/L (ref 3.5–5.3)
Sodium: 139 mEq/L (ref 135–146)

## 2014-11-23 LAB — CBC
Hematocrit: 49 % (ref 42.0–52.0)
Hgb: 16.5 g/dL (ref 13.0–17.0)
MCH: 32.5 pg — ABNORMAL HIGH (ref 28.0–32.0)
MCHC: 33.7 g/dL (ref 32.0–36.0)
MCV: 96.5 fL (ref 80.0–100.0)
MPV: 11 fL (ref 9.4–12.3)
Nucleated RBC: 0 /100 WBC (ref 0–1)
Platelets: 353 10*3/uL (ref 140–400)
RBC: 5.08 10*6/uL (ref 4.70–6.00)
RDW: 15 % (ref 12–15)
WBC: 10.74 10*3/uL (ref 3.50–10.80)

## 2014-11-23 LAB — HEMOLYSIS INDEX: Hemolysis Index: 7 (ref 0–18)

## 2014-11-23 LAB — PT AND APTT
PT INR: 0.9 (ref 0.9–1.1)
PT: 12.5 s — ABNORMAL LOW (ref 12.6–15.0)
PTT: 31 s (ref 23–37)

## 2014-11-23 LAB — GFR: EGFR: 52.9

## 2014-11-24 LAB — ECG 12-LEAD
Atrial Rate: 73 {beats}/min
P Axis: 70 degrees
P-R Interval: 138 ms
Q-T Interval: 390 ms
QRS Duration: 100 ms
QTC Calculation (Bezet): 429 ms
R Axis: 74 degrees
T Axis: 38 degrees
Ventricular Rate: 73 {beats}/min

## 2014-11-24 NOTE — Pre-Procedure Instructions (Signed)
ekg 11/23/14 rev,

## 2014-11-24 NOTE — Pre-Procedure Instructions (Signed)
Labs 11/23/14 rev. #1 r/o mrsa, ekg pending.

## 2014-11-25 NOTE — Pre-Procedure Instructions (Addendum)
Reviewed negative MRSA and EKG. Looks like patient will be in for the second.

## 2014-12-07 NOTE — Anesthesia Preprocedure Evaluation (Addendum)
Anesthesia Evaluation    AIRWAY    Mallampati: II    TM distance: >3 FB  Neck ROM: full  Mouth Opening:full   CARDIOVASCULAR    regular and normal       DENTAL    no notable dental hx     PULMONARY    clear to auscultation     OTHER FINDINGS              PSS Anesthesia Comments: EKG in 2016 unchanged from 2015.        Anesthesia Plan    ASA 2     general                                 informed consent obtained    Plan discussed with CRNA.    ECG reviewed

## 2014-12-09 ENCOUNTER — Ambulatory Visit: Payer: Self-pay

## 2014-12-09 ENCOUNTER — Ambulatory Visit
Admission: RE | Admit: 2014-12-09 | Discharge: 2014-12-09 | Disposition: A | Discharge status: Home / Self Care | Payer: No Typology Code available for payment source | Source: Ambulatory Visit | Attending: Urology | Admitting: Urology

## 2014-12-09 ENCOUNTER — Ambulatory Visit: Payer: No Typology Code available for payment source | Admitting: Urology

## 2014-12-09 ENCOUNTER — Encounter
Admission: RE | Disposition: A | Discharge status: Home / Self Care | Payer: Self-pay | Source: Ambulatory Visit | Attending: Urology

## 2014-12-09 ENCOUNTER — Ambulatory Visit: Payer: No Typology Code available for payment source | Admitting: Anesthesiology

## 2014-12-09 ENCOUNTER — Other Ambulatory Visit: Payer: Self-pay

## 2014-12-09 DIAGNOSIS — I10 Essential (primary) hypertension: Secondary | ICD-10-CM | POA: Insufficient documentation

## 2014-12-09 DIAGNOSIS — R972 Elevated prostate specific antigen [PSA]: Secondary | ICD-10-CM | POA: Diagnosis present

## 2014-12-09 DIAGNOSIS — N4231 Prostatic intraepithelial neoplasia: Secondary | ICD-10-CM | POA: Insufficient documentation

## 2014-12-09 HISTORY — PX: BIOPSY, PROSTATE, TRANSRECTAL ULTRASOUND, (TRUS): SHX3259

## 2014-12-09 HISTORY — DX: Disorder of prostate, unspecified: N42.9

## 2014-12-09 SURGERY — BIOPSY, PROSTATE, TRANSRECTAL ULTRASOUND (TRUS)
Anesthesia: Anesthesia General | Site: Pelvis | Wound class: Clean Contaminated

## 2014-12-09 MED ORDER — PROPOFOL 10 MG/ML IV EMUL (WRAP)
INTRAVENOUS | Status: DC | PRN
Start: 2014-12-09 — End: 2014-12-09
  Administered 2014-12-09: 240 mg via INTRAVENOUS

## 2014-12-09 MED ORDER — MIDAZOLAM HCL 2 MG/2ML IJ SOLN
INTRAMUSCULAR | Status: AC
Start: 2014-12-09 — End: ?
  Filled 2014-12-09: qty 2

## 2014-12-09 MED ORDER — CEFAZOLIN SODIUM 1 G IJ SOLR
INTRAMUSCULAR | Status: AC
Start: 2014-12-09 — End: ?
  Filled 2014-12-09: qty 1000

## 2014-12-09 MED ORDER — LACTATED RINGERS IV SOLN
INTRAVENOUS | Status: DC
Start: 2014-12-09 — End: 2014-12-09

## 2014-12-09 MED ORDER — SODIUM CHLORIDE 0.9 % IV SOLN
INTRAVENOUS | Status: DC
Start: 2014-12-09 — End: 2014-12-09

## 2014-12-09 MED ORDER — STERILE WATER FOR IRRIGATION IR SOLN
Status: DC | PRN
Start: 2014-12-09 — End: 2014-12-09
  Administered 2014-12-09: 1000 mL

## 2014-12-09 MED ORDER — MIDAZOLAM HCL 2 MG/2ML IJ SOLN
INTRAMUSCULAR | Status: DC | PRN
Start: 2014-12-09 — End: 2014-12-09
  Administered 2014-12-09: 2 mg via INTRAVENOUS

## 2014-12-09 MED ORDER — CEFTRIAXONE SODIUM 1 G IJ SOLR
INTRAMUSCULAR | Status: AC
Start: 2014-12-09 — End: ?
  Filled 2014-12-09: qty 1000

## 2014-12-09 MED ORDER — LIDOCAINE HCL 2 % IJ SOLN
INTRAMUSCULAR | Status: DC | PRN
Start: 2014-12-09 — End: 2014-12-09
  Administered 2014-12-09: 60 mg

## 2014-12-09 MED ORDER — FENTANYL CITRATE (PF) 50 MCG/ML IJ SOLN (WRAP)
INTRAMUSCULAR | Status: AC
Start: 2014-12-09 — End: ?
  Filled 2014-12-09: qty 2

## 2014-12-09 MED ORDER — CIPROFLOXACIN HCL 500 MG PO TABS
500.0000 mg | ORAL_TABLET | Freq: Two times a day (BID) | ORAL | 0 refills | Status: AC
Start: 2014-12-09 — End: 2014-12-10
  Filled 2014-12-09: qty 2, 1d supply, fill #0

## 2014-12-09 MED ORDER — FENTANYL CITRATE (PF) 50 MCG/ML IJ SOLN (WRAP)
INTRAMUSCULAR | Status: DC | PRN
Start: 2014-12-09 — End: 2014-12-09
  Administered 2014-12-09: 25 ug via INTRAVENOUS
  Administered 2014-12-09: 50 ug via INTRAVENOUS
  Administered 2014-12-09: 25 ug via INTRAVENOUS

## 2014-12-09 MED ORDER — ONDANSETRON HCL 4 MG/2ML IJ SOLN
INTRAMUSCULAR | Status: AC
Start: 2014-12-09 — End: ?
  Filled 2014-12-09: qty 2

## 2014-12-09 MED ORDER — CEFTRIAXONE SODIUM 1 G IJ SOLR
1.0000 g | INTRAMUSCULAR | Status: AC
Start: 2014-12-09 — End: 2014-12-09
  Administered 2014-12-09: 1 g via INTRAVENOUS

## 2014-12-09 MED ORDER — ONDANSETRON HCL 4 MG/2ML IJ SOLN
INTRAMUSCULAR | Status: DC | PRN
Start: 2014-12-09 — End: 2014-12-09
  Administered 2014-12-09: 4 mg via INTRAVENOUS

## 2014-12-09 MED ORDER — PROPOFOL 10 MG/ML IV EMUL (WRAP)
INTRAVENOUS | Status: AC
Start: 2014-12-09 — End: ?
  Filled 2014-12-09: qty 20

## 2014-12-09 SURGICAL SUPPLY — 30 items
BAG DRAINAGE LINGEMAN UROLOGY NONSTERILE GE OEC TABLE 2600 (Procedure Accessories) ×1 IMPLANT
BAG DRN LINGEMAN NS URO GE OEC TBL 2600 (Procedure Accessories) ×3
BRUSH CLEANING CHANNEL PORT (Other) ×3 IMPLANT
CATHETER URETHRAL OD24 FR 5 CC FOLEY 2 (Catheter Urine) ×1
CATHETER URETHRAL OD24 FR 5 CC FOLEY 2 WAY BALLOON ATRAUMATIC (Catheter Urine) ×1 IMPLANT
CATHETER URTH BACTI-GRD NTR RBR DDRGL 5 (Catheter Urine) ×2
CLOTH BEACON TIMEOUT ORANGE (Other) ×3 IMPLANT
COVER PRB 72X5IN STRL ADH TLSCP FOLD (Sheath) ×2
COVER PROBE L72 IN X W5 IN ADHESIVE (Sheath) ×1
COVER PROBE L72 IN X W5 IN ADHESIVE TELESCOPIC FOLD (Sheath) ×1 IMPLANT
GLOVE SURG BIOGEL SZ7.5 (Glove) ×3 IMPLANT
GOWN SMART IMPERVIOUS LARGE (Gown) ×3 IMPLANT
INSTRUMENT BIOPSY L20 CM ANGLE D22 MM 2 (Procedure Accessories) ×1
INSTRUMENT BIOPSY L20 CM ANGLE D22 MM 2 TRIGGER ULTRA SHARP TIP BEVEL (Procedure Accessories) ×1 IMPLANT
INSTRUMENT BX ANG 22MM MXCOR 18GA 20CM (Procedure Accessories) ×2
JELLY KY LUBRICATNG 2 OZ FLIP (Procedure Accessories) ×3 IMPLANT
JUG OMNI 15000CC W SPECIMEN CA (Suction) ×3 IMPLANT
KIT INFECTION CONTROL CUSTOM (Kits) ×3
KIT INFECTION CONTROL CUSTOM IFOH03 (Kits) ×1 IMPLANT
SOL BETADINE SOLUTION 4 OZ (Prep) ×6 IMPLANT
SYRINGE LEUR LOK TIP 30 ML (Syringes, Needles) ×3 IMPLANT
SYRINGE TOOMEY (Syringes, Needles) ×3 IMPLANT
TOWEL L26 IN X W17 IN COTTON PREWASH DELINT BLUE ACTISORB DELUXE (Procedure Accessories) IMPLANT
TOWEL SRG CTTN 26X17IN LF STRL PREWASH (Procedure Accessories)
TRAY CYSTOSCOPY PACK (Pack) ×3 IMPLANT
WATER STERILE PLASTIC POUR BOTTLE 1000 (Irrigation Solutions) ×1
WATER STERILE PLASTIC POUR BOTTLE 1000 ML (Irrigation Solutions) ×1 IMPLANT
WATER STRL 1000ML LF PLS PR BTL (Irrigation Solutions) ×1
WATER STRL 1000ML PLS PR BTL LF (Irrigation Solutions) ×1
WATER STRL F/INHALATN 3000ML (Solution) ×3 IMPLANT

## 2014-12-09 NOTE — Progress Notes (Signed)
Updated wife, gave her instructions and presciption

## 2014-12-09 NOTE — Brief Op Note (Signed)
BRIEF OP NOTE    Date Time: 12/09/2014 5:24 PM    Patient Name:   [name]    Date of Operation:   12/09/2014    Providers Performing:   Surgeon(s):  Tommas Olp, MD    Assistant (s):   Circulator: Shirlyn Goltz, RN  Scrub Person: Cathlean Marseilles    Operative Procedure:   Procedure(s):  BIOPSY, PROSTATE, TRANSRECTAL ULTRASOUND, (TRUS)    Preoperative Diagnosis:   Pre-Op Diagnosis Codes:     * Elevated PSA [R97.20]    Postoperative Diagnosis:   Post-Op Diagnosis Codes:     * Elevated PSA [R97.20]    Anesthesia:   General    Estimated Blood Loss:    * No values recorded between 12/09/2014  5:11 PM and 12/09/2014  5:24 PM *    Implants:   * No implants in log *    Drains:   Drains: no    Specimens:        SPECIMENS (last 24 hours)      Pathology Specimens       12/09/14 1600 12/09/14 1632 12/09/14 1633       Specimen Information    Specimen Testing Required Routine Pathology Routine Pathology Routine Pathology     Specimen ID  A F K     Specimen Description Prostate: LLB Prostate: L Apex Prostate: R Apex     Specimen Information    Specimen Testing Required Routine Pathology Routine Pathology Routine Pathology     Specimen ID  B G L      Specimen Description Prostate: L Base Prostate: R Base Prostate: RLA     Specimen Information    Specimen Testing Required Routine Pathology Routine Pathology      Specimen ID  C H      Specimen Description Prostate: LLM Prostate: RLB      Specimen Information    Specimen Testing Required Routine Pathology Routine Pathology      Specimen ID  D I       Specimen Description Prostate: L Mid Prostate: R Mid      Specimen Information    Specimen Testing Required Routine Pathology Routine Pathology      Specimen ID  E J      Specimen Description Prostate: LLA Prostate: RLM          Findings:   47 gram prostate. Peripheral zone homogenous.    Complications:   None      Signed by: Tommas Olp, MD                                                                            [facility]

## 2014-12-09 NOTE — Transfer of Care (Signed)
VSS. Tolerating RA. Report given.

## 2014-12-09 NOTE — Anesthesia Postprocedure Evaluation (Signed)
Anesthesia Post Evaluation    Patient: [name]    Procedures performed: Procedure(s) with comments:  BIOPSY, PROSTATE, TRANSRECTAL ULTRASOUND, (TRUS) - PROSTATE NEEDLE BIOPSY W/ULTRASOUND     Anesthesia type: General LMA    Patient location:Phase I PACU    Last vitals:   Filed Vitals:    12/09/14 1740   BP: 127/73   Pulse: 75   Temp:    Resp:    SpO2: 97%       Post pain: Patient not complaining of pain, continue current therapy      Mental Status:awake    Respiratory Function: tolerating room air    Cardiovascular: stable    Nausea/Vomiting: patient not complaining of nausea or vomiting    Hydration Status: adequate    Post assessment: no apparent anesthetic complications and no reportable events

## 2014-12-09 NOTE — Discharge Instructions (Signed)
Prostate Needle Biopsy  Prostate needle biopsy is a test tolook forprostate cancer. During the test, a thin, hollowneedle is used to take small samples of tissue from the prostate. The samples are then tested in a lab.    Getting ready for the procedure  Prepare as you have been told. In addition:   Tell your health care provider about all medicines you take. This includes herbs and other supplements. It also includes any blood thinners, such as warfarin,clopidogrel,or daily aspirin. You may need to stop taking some or all of them before the procedure.   You may be told to use a laxative, enemas, or both before the biopsy. This is to empty the colon and rectum of stool. Follow the instructions you are given.   Your health care provider may prescribe antibiotics before the procedure. If so, take these as directed.    The day of the procedure  The procedure is done in a health care provider's office or a hospital. It takes about 15 minutes. You will be able to go home the same day. Transrectal ultrasound is often used during the procedure. This test uses sound waves to make images on a computer screen The images help the health care provider insert the needle in the correct place. During the biopsy:   If ultrasound will be used, you may be asked to drink water to fill your bladder.    You may lie on your side on an exam table.   The ultrasound transducer, which is about the size of a finger,is lubricated. It is then inserted into the rectum. This will feel like a prostate exam. The transducer is moved until the prostate can be seen in the ultrasound images.    To numb the biopsy area, local anestheticsmay be injected.   Using the ultrasound images as a guide, the biopsy needle is inserted. It may be inserted through the rectum or through the skin between the scrotum and the anus (perineum).   The needle is used to take tissue samples from the prostate. About 12 samples are taken from different  areas of the prostate. These samples are sent to a lab to be tested for cancer.  After the biopsy  At first you may feel a little lightheaded. You can lie on the table until you feel able to stand. You can go home after the procedure. You can go back to your normal activities. You may have some blood in your urine orstool that day. This is normal. You may also notice blood in your semen for weeks or months after the biopsy. This is normal and not dangerous. Your health care provider can tell you more about what to expect.  Follow-up care  You will see your health care provider for a follow-up visit. Depending on the biopsy results, you may be scheduled for more tests. If signs of cancer are found, you and your health care provider can discuss options for further testing.     2000-2015 [employer] [address], [place] [address]. All rights reserved. This information is not intended as a substitute for professional medical care. Always follow your healthcare professional's instructions.

## 2014-12-09 NOTE — H&P (Signed)
ADMISSION HISTORY AND PHYSICAL EXAM    Date Time: 12/09/2014 4:43 PM  Patient Name: [name]  Attending Physician: Tommas Olp*    Assessment:   Elevated psa    Plan:   To OR for TRUS biopsy    History of Present Illness:   [name] is a 54 y.o. male who presents to the hospital with elevated psa. Desires TRUS biopsy under anesthesia.    Past Medical History:     Past Medical History   Diagnosis Date   . Hypertension      controlled w/med   . Disorder of prostate      Enlarged   . History of MRSA infection 2015     no followup NT swabs / negative for any active wounds reported needs 2 nasal throat swabs /was referred to surgeon for followup order       Past Surgical History:     Past Surgical History   Procedure Laterality Date   . Splenectomy     . Spinal fusion       L5-S1   . Debridement & irrigation, upper extremity  06/04/2013     Procedure: DEBRIDEMENT & IRRIGATION, UPPER EXTREMITY;  Surgeon: Estevan Oaks, MD;  Location: [facility];  Service: Orthopedics;  Laterality: Right;  RIGHT SHOULDER I&D; REPAIR ACUTE ROTATOR CUFF TEAR       Family History:   History reviewed. No pertinent family history.    Social History:     Social History     Social History   . Marital Status: Single     Spouse Name: N/A   . Number of Children: N/A   . Years of Education: N/A     Social History Main Topics   . Smoking status: Never Smoker    . Smokeless tobacco: Current User     Types: Chew   . Alcohol Use: 2.4 oz/week     4 Glasses of wine per week   . Drug Use: No   . Sexual Activity: Not on file     Other Topics Concern   . Not on file     Social History Narrative       Allergies:   No Known Allergies    Medications:     Prescriptions prior to admission   Medication Sig   . Ascorbic Acid (VITAMIN C PO) Take 1 tablet by mouth daily.   . Multiple Vitamin (MULTIVITAMIN) tablet Take 1 tablet by mouth daily.   . tamsulosin (FLOMAX) 0.4 MG Cap Take 0.4 mg by mouth every morning.   .  valsartan-hydrochlorothiazide (DIOVAN-HCT) 320-25 MG per tablet Take 1 tablet by mouth every morning.   Marland Kitchen VITAMIN E PO Take 1 capsule by mouth daily.       Review of Systems:   A comprehensive review of systems was: History obtained from chart review and the patient    Physical Exam:     Filed Vitals:    12/09/14 1444   BP: 146/72   Pulse: 85   Temp: 98.1 F (36.7 C)   Resp: 16   SpO2: 98%       Intake and Output Summary (Last 24 hours) at Date Time  No intake or output data in the 24 hours ending 12/09/14 1643    General appearance - alert, well appearing, and in no distress  Mental status - alert, oriented to person, place, and time  Chest - no tachypnea, retractions or cyanosis  Heart -  no JVD  Abdomen - nondistended    Labs:     Results     ** No results found for the last 24 hours. **          Recent CBC No results for input(s): RBC, HGB, HCT, MCV, MCH, MCHC, RDW, MPV, LABPLAT in the last 24 hours.    Invalid input(s): WHITEBLOODCE,  NRBCA,  REFLX,  ANRBA  Recent BMP No results for input(s): GLU, BUN, CREAT, CA, NA, K, CL, CO2 in the last 24 hours.    Invalid input(s): AGAP  Recent URINALYSIS Invalid input(s): UMACB, UTYP, UCOLR, UAPP, USPG, UPH, ULEU, UNIT, UPRO, UGLU, UKET, UURO, UBIL, UBLD, UTYPP    Rads:   Radiological Procedure reviewed.    Signed by: Tommas Olp

## 2014-12-10 ENCOUNTER — Encounter: Payer: Self-pay | Admitting: Urology

## 2014-12-10 NOTE — Op Note (Signed)
Procedure Date: 12/09/2014     Patient Type: A     SURGEON: Charolette Forward MD  ASSISTANT:       PREOPERATIVE DIAGNOSIS:  Elevated prostate-specific antigen.     POSTOPERATIVE DIAGNOSIS:  Elevated prostate-specific antigen.     TITLE OF PROCEDURE:  Transrectal ultrasound-guided prostate biopsy.     ESTIMATED BLOOD LOSS:  Minimal.     COMPLICATIONS:  None.     ANESTHESIA:  General.     INDICATIONS:  [name] is a 54 year old male with elevated PSA of 5.05 ng/mL.  He has a  history of intermittent anabolic steroid abuse.  The patient presents for  transrectal ultrasound-guided prostate biopsy.     SPECIMENS:  1.  Left lateral base.  2.  Left base.  3.  Left lateral mid prostate.  4.  Left mid  5.  Left lateral apex.  6.  Left apex.  7.  Right base.  8.  Right lateral base.  9.  Right mid prostate.  10.  Right lateral mid prostate.  11.  Right apex.  12.  Right lateral apex.     FINDINGS:  The patient has a 47 gram prostate, peripheral zone is homogeneous.     COMPLICATIONS:  None.     DESCRIPTION OF PROCEDURE:  After consent was obtained the patient was brought to the operative  theater, a timeout was performed.  The patient was prepped and draped in a  sterile fashion in the dorsal lithotomy position.  The patient did receive  2 grams of Rocephin prior to beginning the procedure.  A transrectal  ultrasound-guided prostate was performed after doing a digital rectal exam.   Digital rectal exam felt no nodules.  The patient's prostate was measured  to be 47 grams.  The peripheral zone was homogeneous.  Seminal vesicle was  normal in size.  The patient's prostate was mapped, 12 biopsies were taken.   Biopsies were taken from the left base, left lateral base, left mid, left  lateral mid, left apex, left lateral apex, right base, right lateral base,  right mid, right lateral mid, right apex, right lateral apex.  At the end  of the procedure no hematoma was noted.  The probe was removed.  He  tolerated the procedure  well.     DISPOSITION:  The patient will follow up in clinic in 1 week for biopsy results.           D:  12/09/2014 17:29 PM by Dr. Lennon [place]. [place] ([id number])  T:  12/10/2014 09:57 AM by Noni Saupe      Everlean Cherry: [id number]) (Doc ID: [id number])

## 2015-02-14 HISTORY — PX: COLONOSCOPY: SHX174

## 2015-02-14 HISTORY — PX: KNEE ARTHROSCOPY: SUR90

## 2015-05-07 ENCOUNTER — Other Ambulatory Visit: Payer: Self-pay | Admitting: Orthopaedic Surgery

## 2015-07-08 ENCOUNTER — Ambulatory Visit: Payer: No Typology Code available for payment source | Attending: Orthopaedic Surgery

## 2015-07-08 ENCOUNTER — Other Ambulatory Visit: Payer: Self-pay | Admitting: Orthopaedic Surgery

## 2015-07-08 DIAGNOSIS — S83241A Other tear of medial meniscus, current injury, right knee, initial encounter: Secondary | ICD-10-CM

## 2015-07-08 LAB — BASIC METABOLIC PANEL
BUN: 18 mg/dL (ref 9.0–28.0)
CO2: 24 mEq/L (ref 21–30)
Calcium: 9.1 mg/dL (ref 8.5–10.5)
Chloride: 102 mEq/L (ref 100–111)
Creatinine: 1.3 mg/dL (ref 0.5–1.5)
Glucose: 93 mg/dL (ref 70–100)
Potassium: 5 mEq/L (ref 3.5–5.3)
Sodium: 136 mEq/L (ref 135–146)

## 2015-07-08 LAB — CBC
Hematocrit: 51.8 % (ref 42.0–52.0)
Hgb: 17.3 g/dL — ABNORMAL HIGH (ref 13.0–17.0)
MCH: 33.2 pg — ABNORMAL HIGH (ref 28.0–32.0)
MCHC: 33.4 g/dL (ref 32.0–36.0)
MCV: 99.4 fL (ref 80.0–100.0)
MPV: 10.9 fL (ref 9.4–12.3)
Nucleated RBC: 0 /100 WBC (ref 0–1)
Platelets: 362 10*3/uL (ref 140–400)
RBC: 5.21 10*6/uL (ref 4.70–6.00)
RDW: 16 % — ABNORMAL HIGH (ref 12–15)
WBC: 11.34 10*3/uL — ABNORMAL HIGH (ref 3.50–10.80)

## 2015-07-08 LAB — URINE MICROSCOPIC

## 2015-07-08 LAB — URINALYSIS
Bilirubin, UA: NEGATIVE
Blood, UA: NEGATIVE
Glucose, UA: NEGATIVE
Leukocyte Esterase, UA: NEGATIVE
Nitrite, UA: NEGATIVE
Protein, UR: 100 — AB
Specific Gravity UA: >1.035 — AB (ref 1.001–1.035)
Urine pH: 6 (ref 5.0–8.0)
Urobilinogen, UA: 0.2 (ref 0.2–2.0)

## 2015-07-08 LAB — HEMOLYSIS INDEX: Hemolysis Index: 15 (ref 0–18)

## 2015-07-08 LAB — GFR: EGFR: 57.5

## 2015-07-09 NOTE — Pre-Procedure Instructions (Signed)
Labs 07/08/15 rev. R/o mrsa nares -, throat + 07/08/15 rev/faxed to Dr Maurice March.

## 2016-02-14 DIAGNOSIS — Z9189 Other specified personal risk factors, not elsewhere classified: Secondary | ICD-10-CM

## 2016-02-14 HISTORY — DX: Other specified personal risk factors, not elsewhere classified: Z91.89

## 2016-04-13 DIAGNOSIS — Z22322 Carrier or suspected carrier of Methicillin resistant Staphylococcus aureus: Secondary | ICD-10-CM

## 2016-04-13 HISTORY — DX: Carrier or suspected carrier of methicillin resistant Staphylococcus aureus: Z22.322

## 2016-04-24 ENCOUNTER — Ambulatory Visit: Payer: No Typology Code available for payment source | Attending: Urology

## 2016-04-24 DIAGNOSIS — N4231 Prostatic intraepithelial neoplasia: Secondary | ICD-10-CM

## 2016-04-24 LAB — ECG 12-LEAD
Atrial Rate: 90 {beats}/min
P Axis: 75 degrees
P-R Interval: 128 ms
Q-T Interval: 362 ms
QRS Duration: 102 ms
QTC Calculation (Bezet): 442 ms
R Axis: 80 degrees
T Axis: 47 degrees
Ventricular Rate: 90 {beats}/min

## 2016-04-24 LAB — BASIC METABOLIC PANEL
BUN: 13 mg/dL (ref 9.0–28.0)
CO2: 30 mEq/L — ABNORMAL HIGH (ref 21–29)
Calcium: 9.4 mg/dL (ref 8.5–10.5)
Chloride: 101 mEq/L (ref 100–111)
Creatinine: 1.2 mg/dL (ref 0.5–1.5)
Glucose: 84 mg/dL (ref 70–100)
Potassium: 4.8 mEq/L (ref 3.5–5.1)
Sodium: 139 mEq/L (ref 136–145)

## 2016-04-24 LAB — CBC
Absolute NRBC: 0 10*3/uL
Hematocrit: 47.8 % (ref 42.0–52.0)
Hgb: 16.3 g/dL (ref 13.0–17.0)
MCH: 32.1 pg — ABNORMAL HIGH (ref 28.0–32.0)
MCHC: 34.1 g/dL (ref 32.0–36.0)
MCV: 94.3 fL (ref 80.0–100.0)
MPV: 10.6 fL (ref 9.4–12.3)
Nucleated RBC: 0 /100 WBC (ref 0.0–1.0)
Platelets: 294 10*3/uL (ref 140–400)
RBC: 5.07 10*6/uL (ref 4.70–6.00)
RDW: 14 % (ref 12–15)
WBC: 9.44 10*3/uL (ref 3.50–10.80)

## 2016-04-24 LAB — GFR: EGFR: >60

## 2016-04-24 LAB — HEMOLYSIS INDEX: Hemolysis Index: 22 — ABNORMAL HIGH (ref 0–18)

## 2016-04-24 LAB — PT AND APTT
PT INR: 1 (ref 0.9–1.1)
PT: 13.1 s (ref 12.6–15.0)
PTT: 30 s (ref 23–37)

## 2016-04-25 NOTE — Pre-Procedure Instructions (Signed)
Reviewed 04/24/16 labs/ekg.  CXR requested from [facility]. No surgery posted at [facility]

## 2016-04-26 ENCOUNTER — Ambulatory Visit: Payer: No Typology Code available for payment source | Attending: Urology

## 2016-04-26 NOTE — Pre-Procedure Instructions (Signed)
All preops in [facility]  Faxed Dr Shellee Milo for H&P/consent  No cardiac or respiratory events within past 6 months  MRSA +  Notified OR- pt is only case that day so time 0730 OK  Faxed posting  Notified Nicole Cella ID and ordered isolation status

## 2016-05-02 ENCOUNTER — Ambulatory Visit: Payer: Self-pay

## 2016-05-02 ENCOUNTER — Ambulatory Visit: Payer: No Typology Code available for payment source | Admitting: Anesthesiology

## 2016-05-02 ENCOUNTER — Ambulatory Visit: Payer: No Typology Code available for payment source | Admitting: Urology

## 2016-05-02 ENCOUNTER — Encounter
Admission: RE | Disposition: A | Discharge status: Home / Self Care | Payer: Self-pay | Source: Ambulatory Visit | Attending: Urology

## 2016-05-02 ENCOUNTER — Ambulatory Visit
Admission: RE | Admit: 2016-05-02 | Discharge: 2016-05-02 | Disposition: A | Discharge status: Home / Self Care | Payer: No Typology Code available for payment source | Source: Ambulatory Visit | Attending: Urology | Admitting: Urology

## 2016-05-02 DIAGNOSIS — C61 Malignant neoplasm of prostate: Secondary | ICD-10-CM | POA: Insufficient documentation

## 2016-05-02 DIAGNOSIS — I10 Essential (primary) hypertension: Secondary | ICD-10-CM | POA: Insufficient documentation

## 2016-05-02 DIAGNOSIS — N4231 Prostatic intraepithelial neoplasia: Secondary | ICD-10-CM

## 2016-05-02 HISTORY — PX: BIOPSY, PROSTATE, TRANSRECTAL ULTRASOUND, (TRUS): SHX3259

## 2016-05-02 SURGERY — BIOPSY, PROSTATE, TRANSRECTAL ULTRASOUND (TRUS)
Anesthesia: Anesthesia General | Site: Pelvis | Wound class: Clean

## 2016-05-02 MED ORDER — ONDANSETRON HCL 4 MG/2ML IJ SOLN
INTRAMUSCULAR | Status: DC | PRN
Start: 2016-05-02 — End: 2016-05-02
  Administered 2016-05-02: 4 mg via INTRAVENOUS

## 2016-05-02 MED ORDER — GENTAMICIN IN SALINE 1.6-0.9 MG/ML-% IV SOLN
INTRAVENOUS | Status: AC
Start: 2016-05-02 — End: ?
  Filled 2016-05-02: qty 50

## 2016-05-02 MED ORDER — VANCOMYCIN 1000 MG IN 250 ML NS IVPB VIAL-MATE (CNR)
INTRAVENOUS | Status: AC
Start: 2016-05-02 — End: ?
  Filled 2016-05-02: qty 250

## 2016-05-02 MED ORDER — MIDAZOLAM HCL 2 MG/2ML IJ SOLN
INTRAMUSCULAR | Status: AC
Start: 2016-05-02 — End: ?
  Filled 2016-05-02: qty 2

## 2016-05-02 MED ORDER — LACTATED RINGERS IV SOLN
INTRAVENOUS | Status: DC
Start: 2016-05-02 — End: 2016-05-02

## 2016-05-02 MED ORDER — MIDAZOLAM HCL 2 MG/2ML IJ SOLN
INTRAMUSCULAR | Status: DC | PRN
Start: 2016-05-02 — End: 2016-05-02
  Administered 2016-05-02: 2 mg via INTRAVENOUS

## 2016-05-02 MED ORDER — SULFAMETHOXAZOLE-TRIMETHOPRIM 800-160 MG PO TABS
1.0000 | ORAL_TABLET | Freq: Two times a day (BID) | ORAL | 0 refills | Status: AC
Start: 2016-05-02 — End: 2016-05-05

## 2016-05-02 MED ORDER — FENTANYL CITRATE (PF) 50 MCG/ML IJ SOLN (WRAP)
INTRAMUSCULAR | Status: AC
Start: 2016-05-02 — End: ?
  Filled 2016-05-02: qty 2

## 2016-05-02 MED ORDER — LIDOCAINE HCL (PF) 2 % IJ SOLN
INTRAMUSCULAR | Status: DC | PRN
Start: 2016-05-02 — End: 2016-05-02
  Administered 2016-05-02: 400 mg via INTRAVENOUS

## 2016-05-02 MED ORDER — FENTANYL CITRATE (PF) 50 MCG/ML IJ SOLN (WRAP)
INTRAMUSCULAR | Status: DC | PRN
Start: 2016-05-02 — End: 2016-05-02
  Administered 2016-05-02: 50 ug via INTRAVENOUS
  Administered 2016-05-02: 25 ug via INTRAVENOUS
  Administered 2016-05-02: 50 ug via INTRAVENOUS

## 2016-05-02 MED ORDER — VANCOMYCIN 1000 MG IN 250 ML NS IVPB VIAL-MATE (CNR)
1000.0000 mg | Freq: Once | INTRAVENOUS | Status: AC
Start: 2016-05-02 — End: 2016-05-02
  Administered 2016-05-02: 08:00:00 1 g via INTRAVENOUS

## 2016-05-02 MED ORDER — GENTAMICIN IN SALINE 1.6-0.9 MG/ML-% IV SOLN
1.0000 mg/kg | Freq: Three times a day (TID) | INTRAVENOUS | Status: AC
Start: 2016-05-02 — End: 2016-05-02
  Administered 2016-05-02: 08:00:00 80 mg via INTRAVENOUS

## 2016-05-02 MED ORDER — PROPOFOL INFUSION 10 MG/ML
INTRAVENOUS | Status: DC | PRN
Start: 2016-05-02 — End: 2016-05-02
  Administered 2016-05-02: 40 mg via INTRAVENOUS
  Administered 2016-05-02: 240 mg via INTRAVENOUS

## 2016-05-02 MED ORDER — LIDOCAINE(URO-JET) 2% JELLY (WRAP)
CUTANEOUS | Status: AC
Start: 2016-05-02 — End: ?
  Filled 2016-05-02: qty 1

## 2016-05-02 SURGICAL SUPPLY — 38 items
BAG DRAINAGE LINGEMAN UROLOGY NONSTERILE GE OEC TABLE 2600 (Procedure Accessories) ×1 IMPLANT
BAG DRN LINGEMAN NS URO GE OEC TBL 2600 (Procedure Accessories) ×2
BRUSH CLEANING CHANNEL PORT (Other) ×3 IMPLANT
CATH FOLEY 5CC 24F 2WY LTX (Catheter Urine) ×1
CATHETER URETHRAL OD24 FR 5 CC FOLEY 2 (Catheter Urine) ×1
CATHETER URETHRAL OD24 FR 5 CC FOLEY 2 WAY BALLOON ATRAUMATIC (Catheter Urine) ×1 IMPLANT
CATHETER URTH BACTI-GRD NTR RBR DDRGL 5 (Catheter Urine) ×1
COVER PRB 72X5IN STRL ADH TLSCP FOLD (Sheath) ×1
COVER PROBE L72 IN X W5 IN ADHESIVE (Sheath) ×1
COVER PROBE L72 IN X W5 IN ADHESIVE TELESCOPIC FOLD (Sheath) ×1 IMPLANT
DRAIN BAG CYSTO/UROLOGY TABLE (Procedure Accessories) ×1
GLOVE SURG BIOGEL SZ7.5 (Glove) ×3 IMPLANT
GOWN SMART IMPERVIOUS LARGE (Gown) ×3 IMPLANT
GUN BIOPSY MAX CORE 18GX20CM (Procedure Accessories) ×1
HANDLE LGHT LF STRL ADP LGHT CNTRL + TCH (Other)
HANDLE LIGHT ADAPTIVE LIGHT CONTROL PLUS (Other)
HANDLE LIGHT ADAPTIVE LIGHT CONTROL PLUS TECHNOLOGY SNAP ON LENS TOUCH (Other) ×1 IMPLANT
HANDLE TRUMPF LIGHT  DISP (Other)
INSTRUMENT BIOPSY L20 CM ANGLE D22 MM 2 (Procedure Accessories) ×1
INSTRUMENT BIOPSY L20 CM ANGLE D22 MM 2 TRIGGER ULTRA SHARP TIP BEVEL (Procedure Accessories) ×1 IMPLANT
INSTRUMENT BX ANG 22MM MXCOR 18GA 20CM (Procedure Accessories) ×1
JELLY LUBE STRL FLPTOP 2OZ (Procedure Accessories) ×3 IMPLANT
JUG OMNI 15000CC W SPECIMEN CA (Suction) ×1 IMPLANT
KIT INFECTION CONTROL CUSTOM (Kits) ×3
KIT INFECTION CONTROL CUSTOM IFOH03 (Kits) ×1 IMPLANT
PROBE COVER 8X72 (Sheath) ×1
SOL BETADINE SOLUTION 4 OZ (Prep) ×6 IMPLANT
SOL WATER STERILE 1000CC BTLE (Irrigation Solutions) ×1
SYRINGE LEUR LOK TIP 30 ML (Syringes, Needles) ×3 IMPLANT
SYRINGE TOOMEY (Syringes, Needles) ×1 IMPLANT
TOWEL L26 IN X W17 IN COTTON PREWASH DELINT BLUE ACTISORB DELUXE (Procedure Accessories) IMPLANT
TOWEL OR STRL DLUX BLUE 10PK (Procedure Accessories)
TOWEL SRG CTTN 26X17IN LF STRL PREWASH (Procedure Accessories)
TRAY CYSTOSCOPY PACK (Pack) ×3 IMPLANT
WATER STERILE PLASTIC POUR BOTTLE 1000 (Irrigation Solutions) ×1
WATER STERILE PLASTIC POUR BOTTLE 1000 ML (Irrigation Solutions) ×1 IMPLANT
WATER STRL 1000ML LF PLS PR BTL (Irrigation Solutions) ×1
WATER STRL F/INHALATN 3000ML (Solution) ×3 IMPLANT

## 2016-05-02 NOTE — Discharge Instructions (Signed)
Prostate Biopsy       Home care  You may have had the biopsy done through your rectum, your urethra, or through the skin between your scrotum and rectum. Your healthcare provider will tell you what to do after the biopsy. These instructions are based on your health condition, the type of biopsy, and your provider's practices.   Your provider may give you pain medicine such as acetaminophen or ibuprofen for discomfort or pain. Follow your provider's instructions for taking these medicines. Don't take aspirin or ibuprofen after the procedure.If you have a chronic liver or kidney disease, talk with your provider before taking these medicines. Also talk with your provider if you've had a stomach ulcer or gastrointestinal bleeding. Let your provider know all the medicines you currently take.   You may need to take antibiotics for 1 to 2 days. This will help prevent an infection. Signs of an infection include chills, pain, or fever.   You may be told to drink 8 ounces of water every 30 minutes for 2 hours. This will help ease any discomfort. You can also take a warm bath or put a warm, damp washcloth over your urethra to help ease the pain.   You may see minor bleeding after the procedure. This is normal and usually needs no treatment. You may see blood in your urine or semen (rust color). You may also have light bleeding from your rectum if you have hemorrhoids.   Your provider will tell you when you can go back to your normal activities. This includes sex, exercise, and straining physically. Discuss these with your provider.    When to seek medical advice  Call your healthcare provider right away if any of these occur:   You aren't able to urinate, or see that you have less flow of urine   Chills and fever of 100.40F (38C)    Severe pain   Signs of an infection that is getting worse. These include worsening pain, pain in your side under the rib cage or in the low back, or foul-smelling urine.   Blood  clots or bright red blood in your stool or urine   You feel confused or very tired      Post Anesthesia Discharge Instructions    Although you may be awake and alert in the recovery room, small amounts of anesthetic remain in your system for about 24 hours.  You may feel tired and sleepy during this time.      You are advised to go directly home from the hospital.    Plan to stay at home and rest for the remainder of the day.    It is advisable to have someone with you at home for 24 hours after surgery.    Do not operate a motor vehicle, or any mechanical or electrical equipment for the next 24 hours.      Be careful when you are walking around, you may become dizzy.  The effects of anesthesia and/or medications are still present and drowsiness may occur    Do not consume alcohol, tranquilizers, sleeping medications, or any other non prescribed medication for the remainder of the day.    Diet:  begin with liquids, progress your diet as tolerated or as directed by your surgeon.  Nausea and vomiting may occur in the next 24 hours.

## 2016-05-02 NOTE — Op Note (Signed)
Procedure Date: 05/02/2016     Patient Type: A     SURGEON: Charolette Forward MD  ASSISTANT:       PREOPERATIVE DIAGNOSIS:  Elevated prostate-specific antigen.     POSTOPERATIVE DIAGNOSES:  Elevated prostatic specific antigen.     TITLE OF PROCEDURE:  Transrectal ultrasound-guided prostate biopsy.     ESTIMATED BLOOD LOSS:  Minimal.     COMPLICATIONS:  None.     ANESTHESIA:  General.     FINDINGS:  The patient has a 58 gram prostate.  No hypo or hyperechoic lesions of the  prostate noted.  Digital rectal exam without nodule.     INDICATIONS:  [name] is a 56 year old white male with history of anabolic steroid use.   The patient's most recent PSA is 5.3 ng/mL.  He had negative prostate  biopsy in October 2016.  The patient's PSA is persistently elevated and  presents for repeat biopsy.  Risks, benefits, complications have been  discussed in their entirety.  He understands and wishes to proceed.     DESCRIPTION OF PROCEDURE:  After consent was obtained, the patient was brought to operative theater,  timeout was performed.  The patient was placed under general anesthetics.   The patient was placed in dorsal supine position.  Digital rectal  examination was performed.  The patient has 50 gram prostate with no  palpable nodules.  Transrectal ultrasound probe was placed into the rectum.   The prostate was mapped and noted to be 58 grams.  No hypo or hyperechoic  lesions noted.  Seminal vesicles normal.  A transrectal ultrasound-guided  prostate biopsy was performed.  A total of 14 cores were taken.  One core  was taken from the following 12 locations:  Right lateral base, right base,  right lateral mid, mid, right lateral apex, right apex, left base, left  lateral base, left mid, left lateral mid, left apex, left lateral apex.   From the transition zone, 2 cores were taken, one from the right transition  zone and one from the left transition zone.  These were placed in the same  sample cup.  At the end of the procedure,  no evidence of hematoma was  noted.  The procedure ended.     DISPOSITION:  The patient will follow up in 1-1/2 weeks for results of biopsy.           D:  05/02/2016 08:00 AM by Dr. Lennon Alstrom. Shellee Milo, MD ([id number])  T:  05/02/2016 08:40 AM by [id number]      Everlean Cherry: [id number]) (Doc ID: [id number])

## 2016-05-02 NOTE — PACU (Signed)
Patient did well post-procedure. VSS. AA&Ox4. Respirations even and unlabored. Patient appears in NAD. Tolerating PO intake well. Denies pain/nausea/discomfort at time of discharge. Patient and fiance, Chapman Moss, given written and verbal discharge instructions; both verbalized excellent understanding. IV d/c'd c/catheter tip intact. Bleeding controlled and dressing applied. Patient discharged home in stable condition via wheelchair in company of his fiance, Chapman Moss.

## 2016-05-02 NOTE — Anesthesia Preprocedure Evaluation (Signed)
Anesthesia Evaluation    AIRWAY    Mallampati: I    TM distance: >3 FB  Neck ROM: full  Mouth Opening:full   CARDIOVASCULAR    cardiovascular exam normal       DENTAL    no notable dental hx     PULMONARY    pulmonary exam normal     OTHER FINDINGS              Relevant Problems   No active problems are marked relevant to this note.               Anesthesia Plan    ASA 2     general               (Risks and benefits with the common expectations discussed and patient wishes to proceed.)      intravenous induction   Detailed anesthesia plan: general LMA        Post op pain management: per surgeon    informed consent obtained    Plan discussed with CRNA.      pertinent labs reviewed             Signed by: Briant Cedar 05/02/16 7:21 AM

## 2016-05-02 NOTE — Brief Op Note (Signed)
BRIEF OP NOTE    Date Time: 05/02/16 7:55 AM    Patient Name:   [name]    Date of Operation:   05/02/2016    Providers Performing:   Surgeon(s):  Tommas Olp, MD    Assistant (s):   Circulator: Drema Dallas, RN  Scrub Person: Harlow Mares    Operative Procedure:   Procedure(s):  BIOPSY, PROSTATE, TRANSRECTAL ULTRASOUND, (TRUS)    Preoperative Diagnosis:   Pre-Op Diagnosis Codes:     * Prostatic intraepithelial neoplasia [N42.31]    Postoperative Diagnosis:   Post-Op Diagnosis Codes:     * Prostatic intraepithelial neoplasia [N42.31]    Anesthesia:   General    Estimated Blood Loss:    * No values recorded between 05/02/2016  7:34 AM and 05/02/2016  7:55 AM *    Implants:   * No implants in log *    Drains:   Drains: no    Specimens:        SPECIMENS (last 24 hours)      Pathology Specimens     Row Name 05/02/16 0600 05/02/16 0700 05/02/16 0748          Additional Information    Send final report to: Julieta Bellini  --  --        Specimen Information    Specimen Testing Required Routine Pathology Routine Pathology Routine Pathology     Specimen ID  A F K     Specimen Description PROSTATE- LLB PROSTATE L APEX PROSTATE R APEX        Specimen Information    Specimen Testing Required Routine Pathology Routine Pathology Routine Pathology     Specimen ID  B G L     Specimen Description PROSTATE-L BASE PROSTATE R BASE PROSTATE R APEX        Specimen Information    Specimen Testing Required Routine Pathology Routine Pathology Routine Pathology     Specimen ID  C H M     Specimen Description PROSTATE LLM PROSTATE RLB PROSTATE TRANSITION ZONE        Specimen Information    Specimen Testing Required Routine Pathology Routine Pathology  --     Specimen ID  D I   --     Specimen Description PROSTATE L MID PROSTATE R MID  --        Specimen Information    Specimen Testing Required Routine Pathology Routine Pathology  --     Specimen ID  E J   --     Specimen Description PROSTATE LLA PROSTATE RLM  --          Findings:   58 gram prostate. No hyper or hypoechoic lesions    Complications:   None      Signed by: Tommas Olp, MD                                                                           [facility]

## 2016-05-02 NOTE — Transfer of Care (Signed)
Anesthesia Transfer of Care Note    Patient: [name]    Procedures performed: Procedure(s) with comments:  BIOPSY, PROSTATE, TRANSRECTAL ULTRASOUND, (TRUS) - PROSTATE NEEDLE BIOPSY WITH ULTRASOUND    Anesthesia type: General LMA    Patient location:Phase I PACU    Last vitals:   Vitals:    05/02/16 0809   BP:    Pulse: 81   Resp:    Temp:    SpO2: 94%       Post pain: Patient not complaining of pain, continue current therapy      Mental Status:awake    Respiratory Function: tolerating room air    Cardiovascular: stable    Nausea/Vomiting: patient not complaining of nausea or vomiting    Hydration Status: adequate    Post assessment: no apparent anesthetic complications    Signed by: Mal Misty III  05/02/16 8:12 AM

## 2016-05-02 NOTE — H&P (Signed)
ADMISSION HISTORY AND PHYSICAL EXAM    Date Time: 05/02/16 7:28 AM  Patient Name: [name]  Attending Physician: Tommas Olp*    Assessment:   Elevated psa    Plan:   TRUS prostate biopsy    History of Present Illness:   [name] is a 56 y.o. male who presents to the hospital with elevated psa 4.7ng/ml. Negative biopsy 11/2014.    Past Medical History:     Past Medical History:   Diagnosis Date   . Disorder of prostate     Enlarged   . History of MRSA infection 2015    no followup NT swabs / negative for any active wounds reported needs 2 nasal throat swabs /was referred to surgeon for followup order   . Hypertension     controlled w/med       Past Surgical History:     Past Surgical History:   Procedure Laterality Date   . BIOPSY, PROSTATE, TRANSRECTAL ULTRASOUND, (TRUS) N/A 12/09/2014    Procedure: BIOPSY, PROSTATE, TRANSRECTAL ULTRASOUND, (TRUS);  Surgeon: Tommas Olp, MD;  Location: [facility];  Service: Urology;  Laterality: N/A;  PROSTATE NEEDLE BIOPSY W/ULTRASOUND    . DEBRIDEMENT & IRRIGATION, UPPER EXTREMITY  06/04/2013    Procedure: DEBRIDEMENT & IRRIGATION, UPPER EXTREMITY;  Surgeon: Estevan Oaks, MD;  Location: [facility];  Service: Orthopedics;  Laterality: Right;  RIGHT SHOULDER I&D; REPAIR ACUTE ROTATOR CUFF TEAR   . KNEE ARTHROSCOPY  2017   . SPINAL FUSION      L5-S1   . SPLENECTOMY         Family History:   History reviewed. No pertinent family history.    Social History:     Social History     Social History   . Marital status: Single     Spouse name: N/A   . Number of children: N/A   . Years of education: N/A     Social History Main Topics   . Smoking status: Never Smoker   . Smokeless tobacco: Current User     Types: Chew   . Alcohol use 2.4 oz/week     4 Glasses of wine per week   . Drug use: No   . Sexual activity: Not on file     Other Topics Concern   . Not on file     Social History Narrative   . No narrative on file        Allergies:   No Known Allergies    Medications:     Prescriptions Prior to Admission   Medication Sig   . diclofenac (VOLTAREN) 75 MG EC tablet Take by mouth.   Marland Kitchen HYDROcodone-acetaminophen (NORCO) 7.5-325 MG per tablet Take by mouth.   . tamsulosin (FLOMAX) 0.4 MG Cap 0.4 mg Daily after dinner.       . valsartan-hydroCHLOROthiazide (DIOVAN-HCT) 320-25 MG per tablet Take 1 tablet by mouth daily.       . vitamin D (CHOLECALCIFEROL) 1000 UNIT tablet Take 1,000 Units by mouth daily.   Marland Kitchen zolpidem (AMBIEN CR) 12.5 MG CR tablet TAKE 1 TABLET BY MOUTH EVERY DAY FOR 90 DAYS       Review of Systems:   A comprehensive review of systems was: History obtained from chart review and the patient    Physical Exam:     Vitals:    05/02/16 0702   BP: 147/87   Pulse: 80   Resp: 18  Temp: 97.2 F (36.2 C)   SpO2: 97%       Intake and Output Summary (Last 24 hours) at Date Time  No intake or output data in the 24 hours ending 05/02/16 0728    General appearance - alert, well appearing, and in no distress  Mental status - alert, oriented to person, place, and time  Chest - no tachypnea, retractions or cyanosis  Heart - no JVD  Abdomen - nondistended    Labs:     Results     Procedure Component Value Units Date/Time    MRSA culture [[id number]] Collected:  05/02/16 0713    Specimen:  Nasopharyngeal from Nares and Throat Updated:  05/02/16 0713          Recent CBC No results for input(s): RBC, HGB, HCT, MCV, MCH, MCHC, RDW, MPV, LABPLAT in the last 24 hours.    Invalid input(s): WHITEBLOODCE,  NRBCA,  REFLX,  ANRBA  Recent BMP No results for input(s): GLU, BUN, CREAT, CA, NA, K, CL, CO2 in the last 24 hours.    Invalid input(s): AGAP    Rads:       Signed by: Tommas Olp

## 2016-05-02 NOTE — Anesthesia Postprocedure Evaluation (Signed)
Anesthesia Post Evaluation    Patient: [name]    Procedures performed: Procedure(s) with comments:  BIOPSY, PROSTATE, TRANSRECTAL ULTRASOUND, (TRUS) - PROSTATE NEEDLE BIOPSY WITH ULTRASOUND    Anesthesia type: General LMA    Patient location:Phase I PACU    Last vitals:   Vitals:    05/02/16 0809   BP:    Pulse: 81   Resp:    Temp:    SpO2: 94%       Post pain: Patient not complaining of pain, continue current therapy      Mental Status:awake and sedated    Respiratory Function: tolerating face mask    Cardiovascular: stable    Nausea/Vomiting: patient not complaining of nausea or vomiting    Hydration Status: adequate    Post assessment: no apparent anesthetic complications    Signed by: Briant Cedar, 05/02/2016 8:11 AM

## 2016-05-03 ENCOUNTER — Encounter: Payer: Self-pay | Admitting: Urology

## 2016-06-13 DIAGNOSIS — C61 Malignant neoplasm of prostate: Secondary | ICD-10-CM

## 2016-06-13 HISTORY — DX: Malignant neoplasm of prostate: C61

## 2016-07-04 ENCOUNTER — Ambulatory Visit: Payer: No Typology Code available for payment source

## 2016-07-04 NOTE — Pre-Procedure Instructions (Signed)
Faxed order to registration for T&C/CBC/BMP as well as SDA form. Pt will be coming in for labs on 07/14/2016. Called and spoke to [facility] in [place] front desk to notify of MRSA.

## 2016-07-04 NOTE — Pre-Procedure Instructions (Signed)
SDA form and blood product order placed in main registration

## 2016-07-14 ENCOUNTER — Ambulatory Visit
Admission: RE | Admit: 2016-07-14 | Discharge: 2016-07-14 | Disposition: A | Discharge status: Home / Self Care | Payer: No Typology Code available for payment source | Source: Ambulatory Visit

## 2016-07-14 ENCOUNTER — Ambulatory Visit
Admission: RE | Admit: 2016-07-14 | Discharge: 2016-07-14 | Disposition: A | Discharge status: Home / Self Care | Payer: No Typology Code available for payment source | Source: Ambulatory Visit | Attending: Urology | Admitting: Urology

## 2016-07-14 DIAGNOSIS — C61 Malignant neoplasm of prostate: Secondary | ICD-10-CM | POA: Insufficient documentation

## 2016-07-14 DIAGNOSIS — Z01818 Encounter for other preprocedural examination: Secondary | ICD-10-CM | POA: Insufficient documentation

## 2016-07-14 LAB — COMPREHENSIVE METABOLIC PANEL
ALT: 28 U/L (ref 0–55)
AST (SGOT): 22 U/L (ref 5–34)
Albumin/Globulin Ratio: 1 (ref 0.9–2.2)
Albumin: 3.5 g/dL (ref 3.5–5.0)
Alkaline Phosphatase: 66 U/L (ref 38–106)
BUN: 18 mg/dL (ref 9.0–28.0)
Bilirubin, Total: 0.5 mg/dL (ref 0.1–1.2)
CO2: 22 mEq/L (ref 21–29)
Calcium: 9.1 mg/dL (ref 8.5–10.5)
Chloride: 101 mEq/L (ref 100–111)
Creatinine: 1.2 mg/dL (ref 0.5–1.5)
Globulin: 3.4 g/dL (ref 2.0–3.7)
Glucose: 89 mg/dL (ref 70–100)
Potassium: 4.4 mEq/L (ref 3.5–5.1)
Protein, Total: 6.9 g/dL (ref 6.0–8.3)
Sodium: 135 mEq/L — ABNORMAL LOW (ref 136–145)

## 2016-07-14 LAB — TYPE AND SCREEN
AB Screen Gel: NEGATIVE
ABO Rh: O POS

## 2016-07-14 LAB — GFR: EGFR: >60

## 2016-07-14 LAB — CBC
Absolute NRBC: 0 10*3/uL
Hematocrit: 49.9 % (ref 42.0–52.0)
Hgb: 16.8 g/dL (ref 13.0–17.0)
MCH: 32.3 pg — ABNORMAL HIGH (ref 28.0–32.0)
MCHC: 33.7 g/dL (ref 32.0–36.0)
MCV: 96 fL (ref 80.0–100.0)
MPV: 9.9 fL (ref 9.4–12.3)
Nucleated RBC: 0 /100 WBC (ref 0.0–1.0)
Platelets: 328 10*3/uL (ref 140–400)
RBC: 5.2 10*6/uL (ref 4.70–6.00)
RDW: 16 % — ABNORMAL HIGH (ref 12–15)
WBC: 6.57 10*3/uL (ref 3.50–10.80)

## 2016-07-14 LAB — PT AND APTT
PT INR: 0.9 (ref 0.9–1.1)
PT: 12.4 s — ABNORMAL LOW (ref 12.6–15.0)
PTT: 33 s (ref 23–37)

## 2016-07-14 LAB — HEMOLYSIS INDEX: Hemolysis Index: 18 (ref 0–18)

## 2016-07-17 ENCOUNTER — Inpatient Hospital Stay
Payer: No Typology Code available for payment source | Admitting: Student in an Organized Health Care Education/Training Program

## 2016-07-17 ENCOUNTER — Encounter
Admission: RE | Disposition: A | Discharge status: Home / Self Care | Payer: Self-pay | Source: Ambulatory Visit | Attending: Urology

## 2016-07-17 ENCOUNTER — Inpatient Hospital Stay
Admission: RE | Admit: 2016-07-17 | Discharge: 2016-07-20 | DRG: 707 | Disposition: A | Discharge status: Home / Self Care | Payer: No Typology Code available for payment source | Source: Ambulatory Visit | Attending: Urology | Admitting: Urology

## 2016-07-17 DIAGNOSIS — L03116 Cellulitis of left lower limb: Secondary | ICD-10-CM | POA: Diagnosis present

## 2016-07-17 DIAGNOSIS — I10 Essential (primary) hypertension: Secondary | ICD-10-CM | POA: Diagnosis present

## 2016-07-17 DIAGNOSIS — Z981 Arthrodesis status: Secondary | ICD-10-CM

## 2016-07-17 DIAGNOSIS — Y658 Other specified misadventures during surgical and medical care: Secondary | ICD-10-CM | POA: Diagnosis not present

## 2016-07-17 DIAGNOSIS — Z8614 Personal history of Methicillin resistant Staphylococcus aureus infection: Secondary | ICD-10-CM

## 2016-07-17 DIAGNOSIS — C61 Malignant neoplasm of prostate: Principal | ICD-10-CM | POA: Diagnosis present

## 2016-07-17 DIAGNOSIS — K9172 Accidental puncture and laceration of a digestive system organ or structure during other procedure: Secondary | ICD-10-CM | POA: Diagnosis not present

## 2016-07-17 DIAGNOSIS — K660 Peritoneal adhesions (postprocedural) (postinfection): Secondary | ICD-10-CM | POA: Diagnosis present

## 2016-07-17 DIAGNOSIS — Z9081 Acquired absence of spleen: Secondary | ICD-10-CM

## 2016-07-17 DIAGNOSIS — F17220 Nicotine dependence, chewing tobacco, uncomplicated: Secondary | ICD-10-CM | POA: Diagnosis present

## 2016-07-17 HISTORY — PX: ROBOT ASSISTED, LAPAROSCOPIC, PROSTATECTOMY W/ PELVIC LYMPH NODE DISS: SHX5509

## 2016-07-17 HISTORY — PX: LAPAROSCOPIC, ENTEROLYSIS: SHX4490

## 2016-07-17 HISTORY — PX: PROSTATECTOMY, RADICAL: SHX4999

## 2016-07-17 LAB — RED BLOOD CELLS OR HOLD
Expiration Date: 201807012359
Expiration Date: 201807012359
UTYPE: O POS
UTYPE: O POS

## 2016-07-17 LAB — CBC AND DIFFERENTIAL
Absolute NRBC: 0 10*3/uL
Basophils Absolute Automated: 0.04 10*3/uL (ref 0.00–0.20)
Basophils Automated: 0.2 %
Eosinophils Absolute Automated: 0 10*3/uL (ref 0.00–0.70)
Eosinophils Automated: 0 %
Hematocrit: 45 % (ref 42.0–52.0)
Hgb: 15.6 g/dL (ref 13.0–17.0)
Immature Granulocytes Absolute: 0.18 10*3/uL — ABNORMAL HIGH
Immature Granulocytes: 0.7 %
Lymphocytes Absolute Automated: 1.68 10*3/uL (ref 0.50–4.40)
Lymphocytes Automated: 6.3 %
MCH: 32.2 pg — ABNORMAL HIGH (ref 28.0–32.0)
MCHC: 34.7 g/dL (ref 32.0–36.0)
MCV: 92.8 fL (ref 80.0–100.0)
MPV: 9.7 fL (ref 9.4–12.3)
Monocytes Absolute Automated: 1.48 10*3/uL — ABNORMAL HIGH (ref 0.00–1.20)
Monocytes: 5.6 %
Neutrophils Absolute: 23.27 10*3/uL — ABNORMAL HIGH (ref 1.80–8.10)
Neutrophils: 87.2 %
Nucleated RBC: 0 /100 WBC (ref 0.0–1.0)
Platelets: 297 10*3/uL (ref 140–400)
RBC: 4.85 10*6/uL (ref 4.70–6.00)
RDW: 16 % — ABNORMAL HIGH (ref 12–15)
WBC: 26.65 10*3/uL — ABNORMAL HIGH (ref 3.50–10.80)

## 2016-07-17 LAB — BASIC METABOLIC PANEL
Anion Gap: 9 (ref 5.0–15.0)
BUN: 14 mg/dL (ref 9–28)
CO2: 23 mEq/L (ref 22–29)
Calcium: 8.7 mg/dL (ref 8.5–10.5)
Chloride: 105 mEq/L (ref 100–111)
Creatinine: 1.7 mg/dL — ABNORMAL HIGH (ref 0.7–1.3)
Glucose: 153 mg/dL — ABNORMAL HIGH (ref 70–100)
Potassium: 4.7 mEq/L (ref 3.5–5.1)
Sodium: 137 mEq/L (ref 136–145)

## 2016-07-17 LAB — GFR: EGFR: 42

## 2016-07-17 SURGERY — PROSTATECTOMY, RADICAL
Anesthesia: Anesthesia General | Site: Abdomen | Wound class: Clean Contaminated

## 2016-07-17 MED ORDER — FENTANYL CITRATE (PF) 50 MCG/ML IJ SOLN (WRAP)
INTRAMUSCULAR | Status: AC
Start: 2016-07-17 — End: 2016-07-17
  Administered 2016-07-17: 15:00:00 25 ug via INTRAVENOUS
  Filled 2016-07-17: qty 2

## 2016-07-17 MED ORDER — GLYCOPYRROLATE 0.2 MG/ML IJ SOLN
INTRAMUSCULAR | Status: DC | PRN
Start: 2016-07-17 — End: 2016-07-17
  Administered 2016-07-17: .8 mg via INTRAVENOUS

## 2016-07-17 MED ORDER — PROPOFOL 10 MG/ML IV EMUL (WRAP)
INTRAVENOUS | Status: DC | PRN
Start: 2016-07-17 — End: 2016-07-17
  Administered 2016-07-17: 50 mg via INTRAVENOUS
  Administered 2016-07-17: 250 mg via INTRAVENOUS
  Administered 2016-07-17: 50 mg via INTRAVENOUS

## 2016-07-17 MED ORDER — MIDAZOLAM HCL 2 MG/2ML IJ SOLN
INTRAMUSCULAR | Status: AC
Start: 2016-07-17 — End: ?
  Filled 2016-07-17: qty 2

## 2016-07-17 MED ORDER — SODIUM CHLORIDE 0.9 % IV SOLN
INTRAVENOUS | Status: DC
Start: 2016-07-17 — End: 2016-07-17

## 2016-07-17 MED ORDER — MIDAZOLAM HCL 2 MG/2ML IJ SOLN
1.0000 mg | Freq: Once | INTRAMUSCULAR | Status: AC
Start: 2016-07-17 — End: 2016-07-17
  Administered 2016-07-17: 16:00:00 1 mg via INTRAVENOUS

## 2016-07-17 MED ORDER — LACTATED RINGERS IR SOLN
Status: AC | PRN
Start: 2016-07-17 — End: 2016-07-17
  Administered 2016-07-17: 1000 mL

## 2016-07-17 MED ORDER — HYDROMORPHONE HCL 1 MG/ML IJ SOLN
INTRAMUSCULAR | Status: AC
Start: 2016-07-17 — End: ?
  Filled 2016-07-17: qty 1

## 2016-07-17 MED ORDER — MEPERIDINE HCL 25 MG/ML IJ SOLN
12.5000 mg | INTRAMUSCULAR | Status: DC | PRN
Start: 2016-07-17 — End: 2016-07-17

## 2016-07-17 MED ORDER — VALSARTAN 320 MG PO TABS
1.0000 | ORAL_TABLET | Freq: Every day | ORAL | Status: DC
Start: 2016-07-17 — End: 2016-07-17

## 2016-07-17 MED ORDER — PROPOFOL 10 MG/ML IV EMUL (WRAP)
INTRAVENOUS | Status: AC
Start: 2016-07-17 — End: ?
  Filled 2016-07-17: qty 40

## 2016-07-17 MED ORDER — LABETALOL HCL 5 MG/ML IV SOLN
INTRAVENOUS | Status: DC | PRN
Start: 2016-07-17 — End: 2016-07-17
  Administered 2016-07-17: 5 mg via INTRAVENOUS
  Administered 2016-07-17: 84 mg via INTRAVENOUS

## 2016-07-17 MED ORDER — HYDROCODONE-ACETAMINOPHEN 5-325 MG PO TABS
1.0000 | ORAL_TABLET | Freq: Once | ORAL | Status: DC | PRN
Start: 2016-07-17 — End: 2016-07-17

## 2016-07-17 MED ORDER — HYDROMORPHONE HCL 0.5 MG/0.5 ML IJ SOLN
INTRAMUSCULAR | Status: AC
Start: 2016-07-17 — End: 2016-07-17
  Administered 2016-07-17: 16:00:00 0.5 mg via INTRAVENOUS
  Filled 2016-07-17: qty 0.5

## 2016-07-17 MED ORDER — FENTANYL CITRATE (PF) 50 MCG/ML IJ SOLN (WRAP)
25.0000 ug | INTRAMUSCULAR | Status: AC | PRN
Start: 2016-07-17 — End: 2016-07-17
  Administered 2016-07-17 (×3): 25 ug via INTRAVENOUS

## 2016-07-17 MED ORDER — SODIUM CHLORIDE 0.9 % IR SOLN
Status: DC | PRN
Start: 2016-07-17 — End: 2016-07-17
  Administered 2016-07-17: 1000 mL

## 2016-07-17 MED ORDER — STERILE WATER FOR IRRIGATION IR SOLN
Status: DC | PRN
Start: 2016-07-17 — End: 2016-07-17
  Administered 2016-07-17: 2000 mL

## 2016-07-17 MED ORDER — VALSARTAN 160 MG PO TABS
320.0000 mg | ORAL_TABLET | Freq: Every day | ORAL | Status: DC
Start: 2016-07-17 — End: 2016-07-20
  Administered 2016-07-17 – 2016-07-20 (×4): 320 mg via ORAL
  Filled 2016-07-17 (×4): qty 2

## 2016-07-17 MED ORDER — NEOSTIGMINE METHYLSULFATE 1 MG/ML IJ/IV SOLN (WRAP)
Status: DC | PRN
Start: 2016-07-17 — End: 2016-07-17
  Administered 2016-07-17: 5 mg via INTRAVENOUS

## 2016-07-17 MED ORDER — HYDRALAZINE HCL 20 MG/ML IJ SOLN
5.0000 mg | INTRAMUSCULAR | Status: DC | PRN
Start: 2016-07-17 — End: 2016-07-17

## 2016-07-17 MED ORDER — HYDROMORPHONE HCL 0.5 MG/0.5 ML IJ SOLN
0.5000 mg | INTRAMUSCULAR | Status: DC | PRN
Start: 2016-07-17 — End: 2016-07-17

## 2016-07-17 MED ORDER — LACTATED RINGERS IV SOLN
INTRAVENOUS | Status: DC | PRN
Start: 2016-07-17 — End: 2016-07-17

## 2016-07-17 MED ORDER — MORPHINE SULFATE 2 MG/ML IJ/IV SOLN (WRAP)
2.0000 mg | Status: DC | PRN
Start: 2016-07-17 — End: 2016-07-18

## 2016-07-17 MED ORDER — FENTANYL CITRATE (PF) 50 MCG/ML IJ SOLN (WRAP)
INTRAMUSCULAR | Status: DC | PRN
Start: 2016-07-17 — End: 2016-07-17
  Administered 2016-07-17 (×3): 50 ug via INTRAVENOUS
  Administered 2016-07-17: 100 ug via INTRAVENOUS

## 2016-07-17 MED ORDER — VECURONIUM BROMIDE 10 MG IV SOLR
INTRAVENOUS | Status: DC | PRN
Start: 2016-07-17 — End: 2016-07-17
  Administered 2016-07-17: 3 mg via INTRAVENOUS
  Administered 2016-07-17: 2 mg via INTRAVENOUS
  Administered 2016-07-17 (×3): 3 mg via INTRAVENOUS

## 2016-07-17 MED ORDER — HYDROMORPHONE HCL 0.5 MG/0.5 ML IJ SOLN
INTRAMUSCULAR | Status: AC
Start: 2016-07-17 — End: 2016-07-17
  Administered 2016-07-17: 15:00:00 0.5 mg via INTRAVENOUS
  Filled 2016-07-17: qty 1

## 2016-07-17 MED ORDER — ALPRAZOLAM 0.25 MG PO TABS
1.0000 mg | ORAL_TABLET | Freq: Four times a day (QID) | ORAL | Status: DC | PRN
Start: 2016-07-17 — End: 2016-07-20
  Administered 2016-07-17 – 2016-07-20 (×6): 1 mg via ORAL
  Filled 2016-07-17 (×7): qty 4

## 2016-07-17 MED ORDER — HYDROMORPHONE HCL 0.5 MG/0.5 ML IJ SOLN
INTRAMUSCULAR | Status: AC
Start: 2016-07-17 — End: 2016-07-17
  Administered 2016-07-17: 15:00:00 0.5 mg via INTRAVENOUS
  Filled 2016-07-17: qty 0.5

## 2016-07-17 MED ORDER — ONDANSETRON HCL 4 MG/2ML IJ SOLN
4.0000 mg | Freq: Three times a day (TID) | INTRAMUSCULAR | Status: DC | PRN
Start: 2016-07-17 — End: 2016-07-20

## 2016-07-17 MED ORDER — HYDROCHLOROTHIAZIDE 25 MG PO TABS
25.0000 mg | ORAL_TABLET | Freq: Every day | ORAL | Status: DC
Start: 2016-07-17 — End: 2016-07-20
  Administered 2016-07-17 – 2016-07-20 (×4): 25 mg via ORAL
  Filled 2016-07-17 (×4): qty 1

## 2016-07-17 MED ORDER — LIDOCAINE HCL (PF) 2 % IJ SOLN
INTRAMUSCULAR | Status: AC
Start: 2016-07-17 — End: ?
  Filled 2016-07-17: qty 5

## 2016-07-17 MED ORDER — DEXAMETHASONE SODIUM PHOSPHATE 4 MG/ML IJ SOLN (WRAP)
INTRAMUSCULAR | Status: DC | PRN
Start: 2016-07-17 — End: 2016-07-17
  Administered 2016-07-17: 8 mg via INTRAVENOUS

## 2016-07-17 MED ORDER — HYDROMORPHONE HCL 1 MG/ML IJ SOLN
INTRAMUSCULAR | Status: DC | PRN
Start: 2016-07-17 — End: 2016-07-17
  Administered 2016-07-17: 0.5 mg via INTRAVENOUS
  Administered 2016-07-17: .5 mg via INTRAVENOUS
  Administered 2016-07-17 (×3): 0.5 mg via INTRAVENOUS
  Administered 2016-07-17: .5 mg via INTRAVENOUS

## 2016-07-17 MED ORDER — HYDROMORPHONE HCL 1 MG/ML IJ SOLN
0.5000 mg | INTRAMUSCULAR | Status: AC | PRN
Start: 2016-07-17 — End: 2016-07-17
  Administered 2016-07-17 (×2): 0.5 mg via INTRAVENOUS

## 2016-07-17 MED ORDER — MIDAZOLAM HCL 2 MG/2ML IJ SOLN
INTRAMUSCULAR | Status: AC
Start: 2016-07-17 — End: 2016-07-17
  Administered 2016-07-17: 16:00:00 1 mg via INTRAVENOUS
  Filled 2016-07-17: qty 2

## 2016-07-17 MED ORDER — MIDAZOLAM HCL 2 MG/2ML IJ SOLN
1.0000 mg | Freq: Once | INTRAMUSCULAR | Status: AC
Start: 2016-07-17 — End: 2016-07-17

## 2016-07-17 MED ORDER — ROCURONIUM BROMIDE 50 MG/5ML IV SOLN
INTRAVENOUS | Status: AC
Start: 2016-07-17 — End: ?
  Filled 2016-07-17: qty 5

## 2016-07-17 MED ORDER — DEXTROSE 5 % IV SOLN
2.0000 g | Freq: Three times a day (TID) | INTRAVENOUS | Status: DC
Start: 2016-07-17 — End: 2016-07-19
  Administered 2016-07-17: 13:00:00 1 g via INTRAVENOUS
  Administered 2016-07-17 – 2016-07-19 (×6): 2 g via INTRAVENOUS
  Filled 2016-07-17 (×7): qty 2000

## 2016-07-17 MED ORDER — FENTANYL CITRATE (PF) 50 MCG/ML IJ SOLN (WRAP)
25.0000 ug | INTRAMUSCULAR | Status: DC | PRN
Start: 2016-07-17 — End: 2016-07-17
  Administered 2016-07-17: 25 ug via INTRAVENOUS

## 2016-07-17 MED ORDER — PROMETHAZINE HCL 25 MG/ML IJ SOLN
6.2500 mg | Freq: Once | INTRAMUSCULAR | Status: DC | PRN
Start: 2016-07-17 — End: 2016-07-17

## 2016-07-17 MED ORDER — FENTANYL CITRATE (PF) 50 MCG/ML IJ SOLN (WRAP)
INTRAMUSCULAR | Status: AC
Start: 2016-07-17 — End: ?
  Filled 2016-07-17: qty 5

## 2016-07-17 MED ORDER — ACETAMINOPHEN 325 MG PO TABS
325.0000 mg | ORAL_TABLET | ORAL | Status: DC | PRN
Start: 2016-07-17 — End: 2016-07-20

## 2016-07-17 MED ORDER — KETOROLAC TROMETHAMINE 30 MG/ML IJ SOLN
30.0000 mg | Freq: Once | INTRAMUSCULAR | Status: AC
Start: 2016-07-17 — End: 2016-07-17

## 2016-07-17 MED ORDER — LACTATED RINGERS IV SOLN
INTRAVENOUS | Status: DC
Start: 2016-07-17 — End: 2016-07-17

## 2016-07-17 MED ORDER — MORPHINE SULFATE 4 MG/ML IJ/IV SOLN (WRAP)
3.0000 mg | Status: DC | PRN
Start: 2016-07-17 — End: 2016-07-18
  Administered 2016-07-17 – 2016-07-18 (×2): 3 mg via INTRAVENOUS
  Filled 2016-07-17 (×2): qty 1

## 2016-07-17 MED ORDER — SUCCINYLCHOLINE CHLORIDE 20 MG/ML IJ SOLN
INTRAMUSCULAR | Status: AC
Start: 2016-07-17 — End: ?
  Filled 2016-07-17: qty 10

## 2016-07-17 MED ORDER — HYDROMORPHONE HCL 1 MG/ML IJ SOLN
INTRAMUSCULAR | Status: AC
Start: 2016-07-17 — End: 2016-07-17
  Administered 2016-07-17: 15:00:00 0.5 mg via INTRAVENOUS
  Filled 2016-07-17: qty 1

## 2016-07-17 MED ORDER — ONDANSETRON HCL 4 MG/2ML IJ SOLN
INTRAMUSCULAR | Status: DC | PRN
Start: 2016-07-17 — End: 2016-07-17
  Administered 2016-07-17: 4 mg via INTRAVENOUS

## 2016-07-17 MED ORDER — LIDOCAINE HCL 2 % IJ SOLN
INTRAMUSCULAR | Status: DC | PRN
Start: 2016-07-17 — End: 2016-07-17
  Administered 2016-07-17: 100 mg via INTRAVENOUS

## 2016-07-17 MED ORDER — KETAMINE HCL 50 MG/ML IJ SOLN
INTRAMUSCULAR | Status: DC | PRN
Start: 2016-07-17 — End: 2016-07-17
  Administered 2016-07-17: 10 mg via INTRAVENOUS
  Administered 2016-07-17: 50 mg via INTRAVENOUS
  Administered 2016-07-17 (×4): 10 mg via INTRAVENOUS

## 2016-07-17 MED ORDER — MIDAZOLAM HCL 2 MG/2ML IJ SOLN
INTRAMUSCULAR | Status: DC | PRN
Start: 2016-07-17 — End: 2016-07-17
  Administered 2016-07-17: 2 mg via INTRAVENOUS

## 2016-07-17 MED ORDER — ONDANSETRON HCL 4 MG/2ML IJ SOLN
4.0000 mg | Freq: Once | INTRAMUSCULAR | Status: DC | PRN
Start: 2016-07-17 — End: 2016-07-17

## 2016-07-17 MED ORDER — LIDOCAINE-TRANSPARENT DRESSING 4 % EX KIT
PACK | Freq: Once | CUTANEOUS | Status: AC
Start: 2016-07-17 — End: 2016-07-17
  Filled 2016-07-17: qty 1

## 2016-07-17 MED ORDER — KETAMINE HCL 50 MG/ML IJ SOLN
INTRAMUSCULAR | Status: AC
Start: 2016-07-17 — End: ?
  Filled 2016-07-17: qty 10

## 2016-07-17 MED ORDER — OXYCODONE-ACETAMINOPHEN 5-325 MG PO TABS
2.0000 | ORAL_TABLET | ORAL | Status: DC | PRN
Start: 2016-07-17 — End: 2016-07-18
  Administered 2016-07-17 – 2016-07-18 (×3): 2 via ORAL
  Filled 2016-07-17 (×3): qty 2

## 2016-07-17 MED ORDER — DEXAMETHASONE SODIUM PHOSPHATE 4 MG/ML IJ SOLN
INTRAMUSCULAR | Status: AC
Start: 2016-07-17 — End: ?
  Filled 2016-07-17: qty 1

## 2016-07-17 MED ORDER — HYDROMORPHONE HCL 0.5 MG/0.5 ML IJ SOLN
INTRAMUSCULAR | Status: DC
Start: 2016-07-17 — End: 2016-07-17
  Filled 2016-07-17: qty 0.5

## 2016-07-17 MED ORDER — ROCURONIUM BROMIDE 10 MG/ML IV SOLN (WRAP)
INTRAVENOUS | Status: DC | PRN
Start: 2016-07-17 — End: 2016-07-17
  Administered 2016-07-17: 50 mg via INTRAVENOUS

## 2016-07-17 MED ORDER — KETOROLAC TROMETHAMINE 30 MG/ML IJ SOLN
INTRAMUSCULAR | Status: AC
Start: 2016-07-17 — End: 2016-07-17
  Administered 2016-07-17: 16:00:00 30 mg via INTRAVENOUS
  Filled 2016-07-17: qty 1

## 2016-07-17 MED ORDER — CEFAZOLIN SODIUM 10 G IJ SOLR
INTRAMUSCULAR | Status: AC
Start: 2016-07-17 — End: ?
  Filled 2016-07-17: qty 2000

## 2016-07-17 MED ORDER — BENZOCAINE-MENTHOL 15-3.6 MG MT LOZG
1.0000 | LOZENGE | OROMUCOSAL | Status: DC | PRN
Start: 2016-07-17 — End: 2016-07-20
  Administered 2016-07-17: 22:00:00 1 via BUCCAL
  Filled 2016-07-17: qty 1

## 2016-07-17 MED ORDER — SODIUM CHLORIDE 0.9 % IV SOLN
INTRAVENOUS | Status: DC
Start: 2016-07-17 — End: 2016-07-19

## 2016-07-17 MED ORDER — OXYCODONE-ACETAMINOPHEN 5-325 MG PO TABS
1.0000 | ORAL_TABLET | ORAL | Status: DC | PRN
Start: 2016-07-17 — End: 2016-07-18

## 2016-07-17 SURGICAL SUPPLY — 134 items
ADHESIVE SKIN CLOSURE DERMABOND ADVANCED (Skin Closure) ×2
ADHESIVE SKIN CLOSURE DERMABOND ADVANCED .7 ML LIQUID APPLICATOR (Skin Closure) ×2 IMPLANT
ADHESIVE SKIN CLOSURE DERMABOND PRINEO (Adhesion Barriers) ×2
ADHESIVE SKIN CLOSURE DERMABOND PRINEO 3.8 ML LIQUID MESH APPLICATOR (Adhesion Barriers) ×2 IMPLANT
ADHESIVE SKNCLS 2 OCTYL CYNCRLT .7ML (Skin Closure) ×2
ADHESIVE SKNCLS 2-OCTYL CYNCRLT 3.8ML (Adhesion Barriers) ×2
ARM FOAM POSITIONER (Kits) ×4 IMPLANT
BAG URINARY DRN MONO FLO (Procedure Accessories) ×8 IMPLANT
BETADINE SCRUB SURGICAL 4OZ (Scrub Supplies) ×4 IMPLANT
BULB DRAINAGE LIGHTWEIGHT LOW LEVEL (Drain) ×2
BULB DRAINAGE LIGHTWEIGHT LOW LEVEL SUCTION RELIAVAC SILICONE 100 CC (Drain) ×2 IMPLANT
BULB DRN SIL 100CC LF STRL LTWT LO LVL (Drain) ×2
CABLE HIGH FREQUENCY MONOPOLAR (Cautery) IMPLANT
CATHETER URET FLXM 5FR 70CM LF STRL 1 (Catheter Urine)
CATHETER URETERAL FLEXIMA OD5 FR L70 CM (Catheter Urine)
CATHETER URETERAL FLEXIMA OD5 FR L70 CM 1 LUMEN INJECTION HUB (Catheter Urine) IMPLANT
CLIP INTERNAL LARGE LIGATE NONABSORBABLE (Clips) ×8
CLIP INTERNAL LARGE LIGATE NONABSORBABLE CARTRIDGE WECK HEM-O-LOK (Clips) ×8 IMPLANT
CLIP INTNL PLMR LG WECK HEM-O-LOK LF (Clips) ×8
CLIP SUT PDS LPR TY VCL LF STRL ABS (Suture) ×2
CLIP SUTURE ABSORBABLE LAPRA-TY VICRYL (Suture) ×2
CLIP SUTURE ABSORBABLE LAPRA-TY VICRYL POLYDIOXANONE (Suture) ×2 IMPLANT
CORD ELECTROSURGICAL GREEN 12FT PLASTIC BIPOLAR STERILE DISPOSABLE (Cautery) ×2 IMPLANT
CORD ESURG PLS 12FT STRL BP DISP GRN (Cautery) ×4
DEVICE CLOSURE L9 IN 3-0 GS-21 V-LOC 90 (Suture) ×2
DEVICE CLSR 3-0 CV-23 TPR PNT V-LOC 90 (Suture) ×2
DEVICE CLSR 3-0 CV-23 V-LOC 180 6IN ABS (Suture) ×4
DEVICE CLSR 3-0 GS-21 V-LOC 90 9IN ABS (Suture) ×2
DRAIN SUCTION JCKPRT RND 19F (Drain) IMPLANT
DRAPE 3-ARM (Drape) ×4
DRAPE 3/4 SHEET FANFLD 52X76IN (Drape) ×4 IMPLANT
DRAPE ACCESSORY 3 ARM DA VINCI 20X13X10.5IN 420290 (Drape) ×2 IMPLANT
DRAPE INST  ARM (Drape) ×4 IMPLANT
DRAPE SCOPEPILLOW 44 X 66 (Drape) ×4 IMPLANT
DRESSING SCR TGDRM 4.5X3.5IN LF STRL FLM (Dressing) ×2
DRESSING SECUREMENT TEGADERM L4 1/2 IN X (Dressing) ×2
DRESSING SECUREMENT TEGADERM L4 1/2 IN X W3 1/2 IN INTRAVENOUS FILM (Dressing) ×2 IMPLANT
GLOVE SURG BIOGEL SZ7.5 (Glove) ×8 IMPLANT
HANDLE LGHT LF STRL ADP LGHT CNTRL + TCH (Other) ×1
HANDLE LIGHT ADAPTIVE LIGHT CONTROL PLUS (Other) ×2
HANDLE LIGHT ADAPTIVE LIGHT CONTROL PLUS TECHNOLOGY SNAP ON LENS TOUCH (Other) ×2 IMPLANT
HNDL LGHT ADP LGHT CNTRL + TCH SNPON LEN (Other) ×1
HOLDER CATH VLCR UNV CATH-MATE LF ADJ (Procedure Accessories) ×2
HOLDER CATHETER UNIVERSAL ADJUSTABLE (Procedure Accessories) ×2 IMPLANT
JELLY LUB EZ LF STRL H2O SOL NGRS TRNLU (Irrigation Solutions) ×4 IMPLANT
KIT ACCESSORY DISP DAVINCI (Kits) ×4 IMPLANT
KIT CATHETER FOLEY 18F 5CC 2WY (Catheter Micellaneous) ×4
KIT CATHETER FOLEY 18F 5CC 2WY (Catheter Miscellaneous) ×4 IMPLANT
KIT HEMOSTATIC MALLEABLE APPLICATOR FLOSEAL 13CM MATRIX 5ML (Hemostat) ×2 IMPLANT
KIT HMST MTRX 5ML 13CM FLSL MLBL APL (Hemostat) ×4
KIT INFECTION CONTROL CUSTOM (Kits) ×4
KIT INFECTION CONTROL CUSTOM IFOH03 (Kits) ×2 IMPLANT
LABEL MED LF STRL PK CSTM DISP (Other) ×2
LABEL MEDICAL PACK CUSTOM (Other) ×2
LABEL MEDICAL PACK CUSTOM CL22094 CUSTOM MED LABEL PACK (Other) ×2 IMPLANT
NEEDLE PNEUMO 150MM (Needles) ×4 IMPLANT
OBTURATOR BLDLS DISP 8MM (Procedure Accessories) ×4
OBTURATOR ROBOTIC BLADELESS SHORT DA VINCI SI ENDOWRIST 8MM 420023 (Procedure Accessories) ×2 IMPLANT
PACK DAVINCI PROCEDURE (Pack) ×4 IMPLANT
PAD ELECTROSRG GRND REM W CRD (Procedure Accessories) ×4 IMPLANT
PENCIL ELECTRO PUSH BUTTON (Cautery) ×4 IMPLANT
PORT HAND ACCESS L120 MM 2 WALL CANNULA (Procedure Accessories) ×2
PORT HAND ACCESS L120 MM 2 WALL CANNULA BLADELESS OPTICAL TIP PALM (Procedure Accessories) ×2 IMPLANT
PORT HND ACC AIRSEAL 12MM 120MM 2 WL CNN (Procedure Accessories) ×2
POUCH SPEC RTRVL PU E-CTCH GLD 10MM 34.5 (Laparoscopy Supplies) ×4
POUCH SPECIMEN RETRIEVAL L34.5 CM (Laparoscopy Supplies) ×4
POUCH SPECIMEN RETRIEVAL L34.5 CM ERGONOMIC HANDLE LONG CYLINDRICAL (Laparoscopy Supplies) ×4 IMPLANT
SCISSORS L21 CM ENDOPATH 360 D CURVE (Instrument) ×2
SCISSORS L21 CM ENDOPATH 360 D CURVE RATCHET HANDLE MONOPOLAR CAUTERY (Instrument) ×2 IMPLANT
SCISSORS LAPSCP 360D CRV EPTH 5MM 21CM (Instrument) ×2
SEAL CANNULA 8MM DA VINCI ENDOWRIST DISPOSABLE (Procedure Accessories) ×2 IMPLANT
SEAL CNN DVNC EWRST 8MM DISP (Procedure Accessories) ×4
SET 3 LUMEN FILTER AIRSEAL TUBING (Tubing) ×2
SET 3 LUMEN FILTER AIRSEAL TUBING ACTIVATE CHARCOAL (Tubing) ×2 IMPLANT
SET BASIN IHVI (Procedure Accessories) ×4
SET BASIN IHVI SUT12BSHFB (Procedure Accessories) ×2 IMPLANT
SET TUBING ACT CHRCL AIRSEAL LF STRL 3 (Tubing) ×2
SOL SCRUB POVIDONE IODINE 4OZ (Scrub Supplies) ×4 IMPLANT
SOLUTION IRR 0.9% NACL 1000ML LF STRL (Irrigation Solutions) ×2
SOLUTION IRRIGATION 0.9% SODIUM CHLORIDE (Irrigation Solutions) ×2
SOLUTION IRRIGATION 0.9% SODIUM CHLORIDE 1000 ML PLASTIC POUR BOTTLE (Irrigation Solutions) ×2 IMPLANT
SOLUTION IV LACTATED RINGERS 1000 ML (IV Solutions) ×2
SOLUTION IV LACTATED RINGERS 1000 ML PLASTIC CONTAINER (IV Solutions) ×2 IMPLANT
SOLUTION IV LR 1000ML VFLX LF PLS CNTNR (IV Solutions) ×2
SPONGE CHLRPRP TINT 26ML (Applicator) ×8 IMPLANT
SPONGE DRAIN 4X4 STER (Sponge) ×4 IMPLANT
SPONGE GAUZE 12PLY STRL 4X4IN (Sponge) ×4 IMPLANT
STAPLER SKIN L3.9 MM X W6.9 MM WIDE 35 (Skin Closure) ×2
STAPLER SKIN L3.9 MM X W6.9 MM WIDE 35 COUNT FIX HEAD RATCHET (Skin Closure) ×2 IMPLANT
STAPLER SKIN W4.8 MM X H3.4 MM 35 WIDE (Staplers) ×2 IMPLANT
STAPLER SKN SS MF PRM .51MM 4.8X3.4MM LF (Staplers) ×2
STAPLER SKN SS W PRX PX .58MM 3.9X6.9MM (Skin Closure) ×2
STERILE SURGILUBE 20Z (Procedure Accessories) ×4 IMPLANT
STRIP SKIN CLOSURE L4 IN X W1/2 IN (Dressing) ×2
STRIP SKIN CLOSURE L4 IN X W1/2 IN REINFORCE STERI-STRIP POLYESTER (Dressing) ×2 IMPLANT
STRIP SKNCLS PLSTR STRSTRP 4X.5IN LF (Dressing) ×2
SUCTION IRRIGATOR (Suction) ×4 IMPLANT
SUTURE 3-0 VICRYL 27 IN UNDYED (Suture) ×4 IMPLANT
SUTURE ABS 0 CT PDS2 36IN MFL VIOL (Suture) ×4
SUTURE ABS 1 TP-1 PDS2 27IN MFL 2 STRN (Suture) ×2
SUTURE ETHILON BLACK 3-0 PS-2 L18 IN (Suture) ×2
SUTURE ETHILON BLACK 3-0 PS-2 L18 IN MONOFILAMENT NONABSORBABLE (Suture) ×2 IMPLANT
SUTURE MONOCRYL 4-0 PS2 27IN (Suture) ×8 IMPLANT
SUTURE NABSB 3-0 PS2 ETH MTPS 18IN MFL (Suture) ×2
SUTURE PDS II 0 CT L36 IN MONOFILAMENT (Suture) ×4
SUTURE PDS II 0 CT L36 IN MONOFILAMENT VIOLET ABSORBABLE (Suture) ×4 IMPLANT
SUTURE PDS II 1 TP-1 L27 IN MONOFILAMENT (Suture) ×2
SUTURE PDS II 1 TP-1 L27 IN MONOFILAMENT 2 STRAND VIOLET ABSORBABLE (Suture) ×2 IMPLANT
SUTURE V-LOC 180 3-0 CV-23 1/2 CIRCLE L6 IN ABS GREEN (Suture) ×4 IMPLANT
SUTURE V-LOC 180 3-0 CV-23 L6 IN (Suture) ×4
SUTURE V-LOC 90 3-0 CV-23 1/2 CIRCLE L6 IN ABS VIOLET (Suture) ×2 IMPLANT
SUTURE V-LOC 90 3-0 GS-21 1/2 CIRCLE L9 IN ABS VIOLET (Suture) ×2 IMPLANT
SUTURE V-LOC 90 SURGALLOY 3-0 CV-23 (Suture) ×2
SYRINGE 60 ML GRADUATE TOOMEY TIP (Syringes, Needles) ×2
SYRINGE 60 ML GRADUATE TOOMEY TIP MONOJECT MEDICAL STANDARD (Syringes, Needles) ×2 IMPLANT
SYRINGE MED PP 60ML STD MNJCT LF STRL (Syringes, Needles) ×1
SYRINGE MONOJECT TOOMEY 60ML (Syringes, Needles) ×1
SYRINGE SLIP-TIP 20CC (Syringes, Needles) ×8 IMPLANT
SYSTEM IMAGING 8X6IN CLEARIFY MICROFIBER WARM HUB TRCR WIPE DSPSBL (Kits) ×2 IMPLANT
SYSTEM IMG MRFBR CLEARIFY 8X6IN WRM HUB (Kits) ×4
SYSTEM PSTN PNK PD 29X20X1IN NS (Positioning Supplies) ×4 IMPLANT
TIP CAUT 8MM STD HOT SHR DVNC EWRST CAUT (Procedure Accessories) ×4
TIP ELECTROCAUTERY HOT SHEARS DA VINCI ENDOWRIST CAUTERY MONOPOLAR 8 (Procedure Accessories) ×2 IMPLANT
TOWEL BLUE STERILE (Procedure Accessories) ×4 IMPLANT
TRAY SKIN DRY SCRUB (Tray) ×4 IMPLANT
TROCAR BLADELES 8X100MM (Laparoscopy Supplies) ×4 IMPLANT
TROCAR BLADELESS ENDO 12X10MM (Laparoscopy Supplies) IMPLANT
TROCAR LAPAROSCOPIC L150 MM OD12 MM STABILITY SLEEVE BLADELESS (Laparoscopy Supplies) ×2 IMPLANT
TROCAR LAPAROSCOPIC STABILITY SLEEVE (Laparoscopy Supplies) ×2
TROCAR LAPSCP EPTH XCL 12MM 150MM LF (Laparoscopy Supplies) ×2
WATER STERILE PLASTIC POUR BOTTLE 1000 (Irrigation Solutions) ×2
WATER STERILE PLASTIC POUR BOTTLE 1000 ML (Irrigation Solutions) ×2 IMPLANT
WATER STRL 1000ML LF PLS PR BTL (Irrigation Solutions) ×1
WATER STRL 1000ML PLS PR BTL LF (Irrigation Solutions) ×1

## 2016-07-17 NOTE — Progress Notes (Signed)
Pt c/o right calf pain, knot noted on right outer calf, painful to touch, Dr. Thomasene Lot and Dr. Shellee Milo updated and aware.  Pt c/o continuous 8/10 pain all ordered pain meds given as charted, Dr. Thomasene Lot called and updated, orders received, cont to monitor.

## 2016-07-17 NOTE — Anesthesia Postprocedure Evaluation (Signed)
Anesthesia Post Evaluation    Patient: [name]    Procedures performed: Procedure(s) with comments:  ROBOT ASSISTED, LAPAROSCOPIC, PROSTATECTOMY W/ PELVIC LYMPH NODE DISSECTION - ATTEMPTED ROBOT SI ASSISTED LAPAROSCOPIC PROSTATECTOMY WITH BILATERAL LYMPH NODE DISSECTION. CONVERT TO OPEN.   PROSTATECTOMY, RADICAL - BILATERAL LYMPH NODE  LAPAROSCOPIC, ENTEROLYSIS - ROBOTIC LAP ENTEROLYSIS, SMALL BOWEL  REPAIR DONE BY DR. Darnelle Bos.    Anesthesia type: General ETT    Patient location:Phase I PACU    Last vitals:   Vitals:    07/17/16 1450   BP: 144/74   Pulse: 82   Resp: 16   Temp:    SpO2: 96%       Post pain: Continue adjustment of pain medication; pt receiving meds     Mental Status:awake    Respiratory Function: tolerating room air    Cardiovascular: stable    Nausea/Vomiting: patient not complaining of nausea or vomiting    Hydration Status: adequate    Post assessment: no apparent anesthetic complications    Signed by: Sharlet Salina, 07/17/2016 2:57 PM    Pain in right calf in pacu.  Feels firm. Pt states was not there prior to.  PACU to let Dr. Shellee Milo know.

## 2016-07-17 NOTE — Anesthesia Postprocedure Evaluation (Signed)
Anesthesia Post Evaluation    Patient: [name]    Procedures performed: Procedure(s) with comments:  ROBOT ASSISTED, LAPAROSCOPIC, PROSTATECTOMY W/ PELVIC LYMPH NODE DISSECTION - ATTEMPTED ROBOT SI ASSISTED LAPAROSCOPIC PROSTATECTOMY WITH BILATERAL LYMPH NODE DISSECTION. CONVERT TO OPEN.   PROSTATECTOMY, RADICAL - BILATERAL LYMPH NODE  LAPAROSCOPIC, ENTEROLYSIS - ROBOTIC LAP ENTEROLYSIS, SMALL BOWEL  REPAIR DONE BY DR. Darnelle Bos.    Anesthesia type: General ETT    Patient location:Phase I PACU    Last vitals:   Vitals:    07/17/16 1450   BP: 144/74   Pulse: 82   Resp: 16   Temp:    SpO2: 96%       Post pain: Patient not complaining of pain, continue current therapy      Mental Status:awake    Respiratory Function: tolerating room air    Cardiovascular: stable    Nausea/Vomiting: patient not complaining of nausea or vomiting    Hydration Status: adequate    Post assessment: no apparent anesthetic complications    Signed by: Sharlet Salina, 07/17/2016 2:52 PM

## 2016-07-17 NOTE — Addendum Note (Signed)
Addendum  created 07/17/16 1459 by Sharlet Salina, MD    Sign clinical note

## 2016-07-17 NOTE — Transfer of Care (Signed)
Anesthesia Transfer of Care Note    Patient: [name]    Procedures performed: Procedure(s) with comments:  ROBOT ASSISTED, LAPAROSCOPIC, PROSTATECTOMY W/ PELVIC LYMPH NODE DISSECTION - ATTEMPTED ROBOT SI ASSISTED LAPAROSCOPIC PROSTATECTOMY WITH BILATERAL LYMPH NODE DISSECTION. CONVERT TO OPEN.   PROSTATECTOMY, RADICAL - BILATERAL LYMPH NODE  LAPAROSCOPIC, ENTEROLYSIS - ROBOTIC LAP ENTEROLYSIS, SMALL BOWEL  REPAIR DONE BY DR. Darnelle Bos.    Anesthesia type: general ett  Patient location:Phase I PACU    Last vitals:   Vitals:    07/17/16 0655   BP: 135/83   Pulse: 73   Resp: 18   Temp: 36.9 C (98.4 F)   SpO2: 97%       Post pain: Patient not complaining of pain, continue current therapy      Mental Status:awake and alert     Respiratory Function: tolerating face mask    Cardiovascular: stable    Nausea/Vomiting: patient not complaining of nausea or vomiting    Hydration Status: adequate    Post assessment: no apparent anesthetic complications    Signed by: Memory Dance.  07/17/16 2:29 PM

## 2016-07-17 NOTE — Plan of Care (Signed)
Problem: Bladder/Voiding  Goal: Remains continent  Outcome: Progressing  Foley cherry colored urine   07/17/16 1750   Goal/Interventions addressed this shift   Remains continent  Monitor intake and output     Goal: Perineal skin integrity is maintained or improved  Outcome: Progressing   07/17/16 1750   Goal/Interventions addressed this shift   Perineal skin integrity is maintained or improved Keep intact skin clean and dry     Goal: Free from infection  Outcome: Progressing  Iv abx   07/17/16 1750   Goal/Interventions addressed this shift   Free from infection Monitor/assess for signs and symptoms of infection       Comments: Pt post op prostatectomy. Foley patent and draining. Vitals stable. Sips and chips only.  Lap sites X6 to abdomen, open to air.

## 2016-07-17 NOTE — PACU (Signed)
Pt continuing to complain of 9/10 pain - right calf, bilateral elbows, abdomen. Dr. Annia Belt called, assessed patient bedside. Toradol IV 30mg  and versed 1mg  IV given. Will continue to monitor.

## 2016-07-17 NOTE — Brief Op Note (Signed)
BRIEF OP NOTE    Date Time: 07/17/16 2:14 PM    Patient Name:   [name]    Date of Operation:   07/17/2016    Providers Performing:   Surgeon(s):  Tommas Olp, MD  Lyndee Leo, MD  Campbell Lerner, MD    Assistant (s):   Circulator: Shirlyn Goltz, RN  Relief Circulator: Furnish, Tawni Pummel, RN  Relief Scrub: Lupita Dawn  Scrub Person: Phillis Knack  First Assistant: Keturah Barre  First Assistant Relief: Etta Quill    Operative Procedure:   Procedure(s):  ROBOT ASSISTED, LAPAROSCOPIC, PROSTATECTOMY W/ PELVIC LYMPH NODE DISSECTION  PROSTATECTOMY, RADICAL  LAPAROSCOPIC, ENTEROLYSIS   ENTEROTOMY REPAIR    Preoperative Diagnosis:   Pre-Op Diagnosis Codes:     * Prostate CA [C61]    Postoperative Diagnosis:   Post-Op Diagnosis Codes:     * Prostate CA [C61]    Anesthesia:   General    Estimated Blood Loss:    100 mL    Implants:   * No implants in log *    Drains:   Drains: no    Specimens:     ID Type Source Tests Collected by Time Destination   A : RIGHT SEMINAL VESICLE Not Applicable Other MISCELLANEOUS LAB TEST Tommas Olp, MD 07/17/2016 1231    B : LEFT SEMINAL VESICLE Not Applicable Other MISCELLANEOUS LAB TEST Tommas Olp, MD 07/17/2016 1232    C : LEFT PELVIC NODE Biopsy Lymph Node SURGICAL PATHOLOGY Tommas Olp, MD 07/17/2016 1318    D : RIGHT PELVIC NODE Biopsy Lymph Node SURGICAL PATHOLOGY Tommas Olp, MD 07/17/2016 1319    E :  Biopsy Prostate SURGICAL PATHOLOGY Tommas Olp, MD 07/17/2016 1401          Findings:   Initial trocar placement resulted in enterotomy with placement of trocar into small bowel. Dr. Darnelle Bos evaluated and felt that enterotomy could be repaired with robot-assisted laparoscopy. Approximately 40 gram prostate. Bilateral pelvic node dissection completed.    Complications:   Enterotomy with passage of initial trocar. Enterotomy repair performed per Dr. Darnelle Bos.      Signed by: Tommas Olp,  MD                                                                           [facility]

## 2016-07-17 NOTE — H&P (Signed)
ADMISSION HISTORY AND PHYSICAL EXAM    Date Time: 07/17/16 7:40 AM  Patient Name: [name]  Attending Physician: Tommas Olp*    Assessment:   High risk, T1c Gleason 9=5+4 prostate cancer  Chronic anabolic steroid use    Plan:   To OR for RALP with bilateral pelvic lymph node dissection.    History of Present Illness:   [name] is a 56 y.o. male who presents to the hospital with High risk, Gleason 9=5+4 prostate cancer in 1/12 cores (right apex). PSA 4.5. Metastatic workup without radiographic evidence of disease.    Past Medical History:     Past Medical History:   Diagnosis Date   . Disorder of prostate     Enlarged   . History of MRSA infection 2015    no followup NT swabs / negative for any active wounds reported needs 2 nasal throat swabs /was referred to surgeon for followup order   . Hypertension     controlled w/med   . Malignant tumor of prostate        Past Surgical History:     Past Surgical History:   Procedure Laterality Date   . BIOPSY, PROSTATE, TRANSRECTAL ULTRASOUND, (TRUS) N/A 12/09/2014    Procedure: BIOPSY, PROSTATE, TRANSRECTAL ULTRASOUND, (TRUS);  Surgeon: Tommas Olp, MD;  Location: [facility];  Service: Urology;  Laterality: N/A;  PROSTATE NEEDLE BIOPSY W/ULTRASOUND    . BIOPSY, PROSTATE, TRANSRECTAL ULTRASOUND, (TRUS) N/A 05/02/2016    Procedure: BIOPSY, PROSTATE, TRANSRECTAL ULTRASOUND, (TRUS);  Surgeon: Tommas Olp, MD;  Location: [facility];  Service: Urology;  Laterality: N/A;  PROSTATE NEEDLE BIOPSY WITH ULTRASOUND   . COLONOSCOPY     . DEBRIDEMENT & IRRIGATION, UPPER EXTREMITY  06/04/2013    Procedure: DEBRIDEMENT & IRRIGATION, UPPER EXTREMITY;  Surgeon: Estevan Oaks, MD;  Location: [facility];  Service: Orthopedics;  Laterality: Right;  RIGHT SHOULDER I&D; REPAIR ACUTE ROTATOR CUFF TEAR   . KNEE ARTHROSCOPY  2017   . SPINAL FUSION      L5-S1   . SPLENECTOMY         Family History:   History  reviewed. No pertinent family history.    Social History:     Social History     Social History   . Marital status: Single     Spouse name: N/A   . Number of children: N/A   . Years of education: N/A     Social History Main Topics   . Smoking status: Never Smoker   . Smokeless tobacco: Current User     Types: Chew   . Alcohol use 2.4 oz/week     4 Glasses of wine per week      Comment: occasional   . Drug use: No   . Sexual activity: Not on file     Other Topics Concern   . Not on file     Social History Narrative   . No narrative on file       Allergies:   No Known Allergies    Medications:     Prescriptions Prior to Admission   Medication Sig   . saccharomyces boulardii (FLORASTOR) 250 MG capsule Take 250 mg by mouth daily.   . tamsulosin (FLOMAX) 0.4 MG Cap 0.4 mg Daily after dinner.       . valsartan-hydroCHLOROthiazide (DIOVAN-HCT) 320-25 MG per tablet Take 1 tablet by mouth daily.       Marland Kitchen  ALPRAZolam (XANAX) 1 MG tablet TAKE 1 TABLET(S) EVERY DAY BY ORAL ROUTE FOR 90 DAYS.       Review of Systems:   A comprehensive review of systems was: History obtained from chart review and the patient    Physical Exam:     Vitals:    07/17/16 0655   BP: 135/83   Pulse: 73   Resp: 18   Temp: 98.4 F (36.9 C)   SpO2: 97%       Intake and Output Summary (Last 24 hours) at Date Time  No intake or output data in the 24 hours ending 07/17/16 0740    General appearance - alert, well appearing, and in no distress  Mental status - alert, oriented to person, place, and time  Lymphatics - not examined  Chest - no tachypnea, retractions or cyanosis  Heart - no JVD  Abdomen - nondistended  Extremities - no pedal edema noted    Labs:     Results     ** No results found for the last 24 hours. **          Recent CBC No results for input(s): RBC, HGB, HCT, MCV, MCH, MCHC, RDW, MPV, LABPLAT in the last 24 hours.    Invalid input(s): WHITEBLOODCE,  NRBCA,  REFLX,  ANRBA  Recent BMP No results for input(s): GLU, BUN, CREAT, CA, NA, K, CL, CO2  in the last 24 hours.    Invalid input(s): AGAP    Rads:        Signed by: Tommas Olp

## 2016-07-17 NOTE — Anesthesia Preprocedure Evaluation (Signed)
Anesthesia Evaluation    AIRWAY    Mallampati: II    TM distance: >3 FB  Neck ROM: full  Mouth Opening:full   CARDIOVASCULAR    cardiovascular exam normal, regular and normal       DENTAL    no notable dental hx     PULMONARY    pulmonary exam normal and clear to auscultation     OTHER FINDINGS    Patient denies any loose or chipped teeth         Relevant Problems   (+) HTN (hypertension)               Anesthesia Plan    ASA 3     general               (Risks discussed including but not limited to:    - Neurological complications such as stroke, TIA  - Cardiovascular complications such as heart attack, Arrhythmias, Cardiac arrest   - Pulmonary complications such as Asthmatic attack,Pulm. Aspiration, Bronchospasm and Pneumonia  - Intra-operative awareness,  - Dental Injuries  - Sore Throat.  - Allergic reactions.  - Death.     Anesthesia explained and Questions answered.     Pt understands and wishes to proceed.    Damita Lack, MD)      intravenous induction   Detailed anesthesia plan: general endotracheal        Post op pain management: per surgeon    informed consent obtained    ECG reviewed  pertinent labs reviewed             Signed by: Damita Lack 07/17/16 7:27 AM

## 2016-07-17 NOTE — Brief Op Note (Signed)
BRIEF OP NOTE    Date Time: 07/17/16 10:28 AM    Patient Name:   [name]    Date of Operation:   07/17/2016    Providers Performing:   Surgeon(s):  Tommas Olp, MD  Lyndee Leo, MD  Campbell Lerner, MD   Asst: Keturah Barre, CSA  Operative Procedure:   Procedure(s):  ROBOTIC LAPAROSCOPIC, ENTEROLYSIS, ENTEROTOMY REPAIR    Preoperative Diagnosis:   Pre-Op Diagnosis Codes:     * Prostate CA [C61]    Postoperative Diagnosis:   INTESTINAL ADHESIONS  ENTEROTOMY    Anesthesia:   General    Estimated Blood Loss:    -    Implants:   -    Drains:   -  Specimens:   -    Findings:   Intestinal adhesions  enterotomy    Complications:   -    Signed by: Campbell Lerner, MD  [facility]

## 2016-07-17 NOTE — Progress Notes (Signed)
PROGRESS NOTE    Date Time: 07/17/16 6:37 PM  Patient Name: [name],[name]      Assessment:   POD#0 Robot-assisted laparoscopic prostatectomy with bilateral pelvic lymph node dissection, LOA, and enterotomy repair     Plan:   Fluid hydration   Follow labs  Advance diet as tolerated    Subjective:   Pain controlled    Medications:     Current Facility-Administered Medications   Medication Dose Route Frequency   . ceFAZolin  2 g Intravenous Q8H   . hydroCHLOROthiazide  25 mg Oral Daily   . valsartan  320 mg Oral Daily       Review of Systems:   A comprehensive review of systems was: History obtained from chart review and the patient    Physical Exam:     Vitals:    07/17/16 1814   BP: 138/78   Pulse: 77   Resp: 15   Temp: (!) 96.5 F (35.8 C)   SpO2: 96%       Intake and Output Summary (Last 24 hours) at Date Time    Intake/Output Summary (Last 24 hours) at 07/17/16 1837  Last data filed at 07/17/16 1659   Gross per 24 hour   Intake             2400 ml   Output              480 ml   Net             1920 ml       General appearance - alert, well appearing, and in no distress  Mental status - alert, oriented to person, place, and time  Chest - no tachypnea, retractions or cyanosis  Heart - no JVD  Abdomen - nondistended.  GU Male - catheter with light pink urine  Extremities - no pedal edema noted    Labs:     Results     Procedure Component Value Units Date/Time    Basic Metabolic Panel [[id number]]  (Abnormal) Collected:  07/17/16 1515    Specimen:  Blood Updated:  07/17/16 1535     Glucose 153 (H) mg/dL      BUN 14 mg/dL      Creatinine 1.7 (H) mg/dL      Calcium 8.7 mg/dL      Sodium 409 mEq/L      Potassium 4.7 mEq/L      Chloride 105 mEq/L      CO2 23 mEq/L      Anion Gap 9.0    GFR [[id number]] Collected:  07/17/16 1515     Updated:  07/17/16 1535     EGFR 42.0    CBC and differential [[id number]]  (Abnormal) Collected:  07/17/16 1514    Specimen:  Blood from Blood Updated:  07/17/16 1520     WBC 26.65 (H) x10  3/uL      Hgb 15.6 g/dL      Hematocrit 57.8 %      Platelets 297 x10 3/uL      RBC 4.85 x10 6/uL      MCV 92.8 fL      MCH 32.2 (H) pg      MCHC 34.7 g/dL      RDW 16 (H) %      MPV 9.7 fL      Neutrophils 87.2 %      Lymphocytes Automated 6.3 %      Monocytes 5.6 %  Eosinophils Automated 0.0 %      Basophils Automated 0.2 %      Immature Granulocyte 0.7 %      Nucleated RBC 0.0 /100 WBC      Neutrophils Absolute 23.27 (H) x10 3/uL      Abs Lymph Automated 1.68 x10 3/uL      Abs Mono Automated 1.48 (H) x10 3/uL      Abs Eos Automated 0.00 x10 3/uL      Absolute Baso Automated 0.04 x10 3/uL      Absolute Immature Granulocyte 0.18 (H) x10 3/uL      Absolute NRBC 0.00 x10 3/uL           Recent CBC   Recent Labs      07/17/16   1514   RBC  4.85   Hgb  15.6   Hematocrit  45.0   MCV  92.8   MCH  32.2*   MCHC  34.7   RDW  16*   MPV  9.7     Recent BMP   Recent Labs      07/17/16   1515   Glucose  153*   BUN  14   Creatinine  1.7*   Calcium  8.7   Sodium  137   Potassium  4.7   Chloride  105   CO2  23       Rads:       Signed by: Tommas Olp

## 2016-07-18 ENCOUNTER — Encounter: Payer: Self-pay | Admitting: Urology

## 2016-07-18 DIAGNOSIS — C61 Malignant neoplasm of prostate: Principal | ICD-10-CM

## 2016-07-18 LAB — CBC
Absolute NRBC: 0 10*3/uL
Hematocrit: 42.1 % (ref 42.0–52.0)
Hgb: 14.1 g/dL (ref 13.0–17.0)
MCH: 31.8 pg (ref 28.0–32.0)
MCHC: 33.5 g/dL (ref 32.0–36.0)
MCV: 95 fL (ref 80.0–100.0)
MPV: 10.1 fL (ref 9.4–12.3)
Nucleated RBC: 0 /100 WBC (ref 0.0–1.0)
Platelets: 271 10*3/uL (ref 140–400)
RBC: 4.43 10*6/uL — ABNORMAL LOW (ref 4.70–6.00)
RDW: 16 % — ABNORMAL HIGH (ref 12–15)
WBC: 20.26 10*3/uL — ABNORMAL HIGH (ref 3.50–10.80)

## 2016-07-18 LAB — BASIC METABOLIC PANEL
Anion Gap: 7 (ref 5.0–15.0)
BUN: 13 mg/dL (ref 9–28)
CO2: 29 mEq/L (ref 22–29)
Calcium: 9 mg/dL (ref 8.5–10.5)
Chloride: 100 mEq/L (ref 100–111)
Creatinine: 1.2 mg/dL (ref 0.7–1.3)
Glucose: 114 mg/dL — ABNORMAL HIGH (ref 70–100)
Potassium: 4.1 mEq/L (ref 3.5–5.1)
Sodium: 136 mEq/L (ref 136–145)

## 2016-07-18 LAB — GFR: EGFR: >60

## 2016-07-18 MED ORDER — DOCUSATE SODIUM 100 MG PO CAPS
100.0000 mg | ORAL_CAPSULE | Freq: Three times a day (TID) | ORAL | Status: DC
Start: 2016-07-18 — End: 2016-07-20
  Administered 2016-07-18 – 2016-07-20 (×7): 100 mg via ORAL
  Filled 2016-07-18 (×7): qty 1

## 2016-07-18 MED ORDER — HYDROMORPHONE PCA 0.2 MG/ML BOLUS FROM PCA
1.0000 mg | INTRAVENOUS | Status: DC | PRN
Start: 2016-07-18 — End: 2016-07-18
  Administered 2016-07-18: 1 mg via INTRAVENOUS

## 2016-07-18 MED ORDER — HYDROMORPHONE HCL-NACL 20-0.9 MG/100ML-% IV SOLN
INTRAVENOUS | Status: AC
Start: 2016-07-18 — End: 2016-07-19
  Administered 2016-07-18 (×2): 20 mg via INTRAVENOUS
  Filled 2016-07-18: qty 100

## 2016-07-18 MED ORDER — HYDROMORPHONE PCA 0.2 MG/ML BOLUS FROM PCA
1.0000 mg | INTRAVENOUS | Status: DC | PRN
Start: 2016-07-18 — End: 2016-07-19
  Administered 2016-07-18: 1 mg via INTRAVENOUS

## 2016-07-18 MED ORDER — NALOXONE HCL 0.4 MG/ML IJ SOLN (WRAP)
0.1000 mg | INTRAMUSCULAR | Status: AC | PRN
Start: 2016-07-18 — End: 2016-07-19

## 2016-07-18 MED ORDER — NALOXONE HCL 0.4 MG/ML IJ SOLN (WRAP)
0.1000 mg | INTRAMUSCULAR | Status: DC | PRN
Start: 2016-07-18 — End: 2016-07-19

## 2016-07-18 MED ORDER — HYDROMORPHONE HCL-NACL 20-0.9 MG/100ML-% IV SOLN
INTRAVENOUS | Status: DC
Start: 2016-07-18 — End: 2016-07-18
  Administered 2016-07-18: 06:00:00 20 mg via INTRAVENOUS
  Filled 2016-07-18: qty 100
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
## 2016-07-18 NOTE — UM Notes (Signed)
PATIENT NAME: [name],[name]   DOB: [birth year]   PMH:   Disorder of prostate   History of MRSA infection   Hypertension   Malignant tumor of prostate      ADMITTED ON: 07/17/2016 at 1407 for Inpatient   ADMISSION DIAGNOSIS: Prostate CA [C61]  Prostate cancer [C61]   SURGICAL PROCEDURE(S) COMPLETED: Procedure(s):  ROBOT ASSISTED, LAPAROSCOPIC, PROSTATECTOMY W/ PELVIC LYMPH NODE DISSECTION  PROSTATECTOMY, RADICAL  LAPAROSCOPIC, ENTEROLYSIS   NOTES TO REVIEWER:    This clinical review is based on/compiled from documentation provided by the treatment team within the patient's medical record.   UTILIZATION REVIEW CONTACT: Cherly Anderson. Barnie Alderman, RN, BSN  Clinical Case Manager  - Utilization Review  [facility]    [address]    [place], [place] [address]  NPI: [id number]  Tax ID: [id number]  Phone: [phone]  Fax: [phone]    Please use fax number [phone] to provide authorization for hospital services or to request additional information.        07/17/2016   Pt is a 56 y.o. male who arrived to the hospital for elective surgery.    TO OR FOR PROCEDURE. LABS:    07/17/2016 15:14   WBC 26.65 (H)   Hemoglobin 15.6   Hematocrit 45.0   Platelet Count 297   MCH, POC 32.2 (H)   RDW 16 (H)   Neutro # 23.27 (H)   Abs Lymph Automated 1.68   Abs Mono Automated 1.48 (H)   Absolute Immature Granulocyte 0.18 (H)      07/17/2016 15:15   Glucose 153 (H)   BUN 14   Creatinine 1.7 (H)   Sodium 137   Potassium 4.7   Chloride 105   Carbon Dioxide, Whole Blood 23   Calcium 8.7   Anion Gap 9.0   EGFR 42.0      MEDS:  Ancef 2gm IV Q8H   HCTZ 25mg  QD   Toradol 30mg  IV x 1   Versed 1mg  IV x 2   Diovan 320mg  QD     NS @ 144ml/hr     Xanax 1mg  Q6H PRN x 1   Fentanyl IV x 6   Dilaudid 0.5mg  IV x 6   Morphine 3mg  IV Q4H PRN x 1   Percocet 2 Tabs Q4H PRN x 2  IMAGING:  None     07/18/2016   Continues on Ortho/Surgical Unit, ongoing pain management.     Temp:  [96.5 F (35.8 C)-98.5 F (36.9 C)] 97.3 F (36.3 C)  Heart Rate:   [72-96] 81  Resp Rate:  [13-20] 16  BP: (134-171)/(60-91) 171/87    Attending Notes:  Continue PCA  Await morning labs  Clears.     Anesthesia Notes:   Nurse called earlier and reported patient had pain 10/10.  Clinician bolus 1 mg Dilaudid ordered every 2 hours prn and bolus dose increased to 0.3 mg every 10 minutes   Patient  now reports pain at 8/10  Pain is RLQ + LLQ abdominal quadrants and rectal area- patient describes as constant.  Plan to increase clinician bolus to 1 mg every 1 hour prn.  Patient also has Tylenol 325 mg every 4 hours prn.  Changes made to IV PCA orders LABS:    07/18/2016 08:56   WBC 20.26 (H)   RBC 4.43 (L)   RDW 16 (H)   Glucose 114 (H)      MEDS:  Ancef 2gm IV Q8H  Colace 100mg  TID   HCTZ 25mg  QD   Diovan 320mg  QD     Dilaudid PCA     Xanax 1mg  Q6H PRN x 1   Dilaudid (Bolus from PCA) 1mg  Q1H PRN x 2   Morphine 2mg  IV Q4H PRN x 1   Percocet 2 Tabs Q4H PRN x 1  IMAGING:  None

## 2016-07-18 NOTE — Progress Notes (Signed)
PROGRESS NOTE    Date Time: 07/18/16 7:22 AM  Patient Name: [name],[name]      Assessment:   POD#1 RALP, bilateral pelvic node dissection, LOA, enterotomy repair  Post-op pain    Plan:   Continue PCA  Await morning labs  Clears. Advance diet as tolerated.    Subjective:   Hungry.  Suprapubic pain and pain radiating to rectum.  PO percocet and morphine not controlling pain. Switched to dilaudid PCA and pain controlled    Medications:     Current Facility-Administered Medications   Medication Dose Route Frequency   . ceFAZolin  2 g Intravenous Q8H   . hydroCHLOROthiazide  25 mg Oral Daily   . valsartan  320 mg Oral Daily       Review of Systems:   A comprehensive review of systems was: History obtained from chart review and the patient    Physical Exam:     Vitals:    07/18/16 0610   BP: (!) 152/91   Pulse: 94   Resp: 14   Temp: 98.3 F (36.8 C)   SpO2: 99%       Intake and Output Summary (Last 24 hours) at Date Time    Intake/Output Summary (Last 24 hours) at 07/18/16 1610  Last data filed at 07/18/16 0606   Gross per 24 hour   Intake             2400 ml   Output              830 ml   Net             1570 ml       General appearance - alert, well appearing, and in no distress  Mental status - alert, oriented to person, place, and time  Chest - clear to auscultation, no wheezes, rales or rhonchi, symmetric air entry, no tachypnea, retractions or cyanosis  Heart - normal rate and regular rhythm  Abdomen - Mild fullness in SP region. Appropriate tenderness. No rebound or guarding. incisions intact without erythema  GU Male - urine light pink, clear  Extremities - no pedal edema noted    Labs:     Results     Procedure Component Value Units Date/Time    Basic Metabolic Panel [[id number]]  (Abnormal) Collected:  07/17/16 1515    Specimen:  Blood Updated:  07/17/16 1535     Glucose 153 (H) mg/dL      BUN 14 mg/dL      Creatinine 1.7 (H) mg/dL      Calcium 8.7 mg/dL      Sodium 119 mEq/L      Potassium 4.7 mEq/L       Chloride 105 mEq/L      CO2 23 mEq/L      Anion Gap 9.0    GFR [[id number]] Collected:  07/17/16 1515     Updated:  07/17/16 1535     EGFR 42.0    CBC and differential [[id number]]  (Abnormal) Collected:  07/17/16 1514    Specimen:  Blood from Blood Updated:  07/17/16 1520     WBC 26.65 (H) x10 3/uL      Hgb 15.6 g/dL      Hematocrit 69.6 %      Platelets 297 x10 3/uL      RBC 4.85 x10 6/uL      MCV 92.8 fL      MCH 32.2 (H) pg      MCHC 34.7  g/dL      RDW 16 (H) %      MPV 9.7 fL      Neutrophils 87.2 %      Lymphocytes Automated 6.3 %      Monocytes 5.6 %      Eosinophils Automated 0.0 %      Basophils Automated 0.2 %      Immature Granulocyte 0.7 %      Nucleated RBC 0.0 /100 WBC      Neutrophils Absolute 23.27 (H) x10 3/uL      Abs Lymph Automated 1.68 x10 3/uL      Abs Mono Automated 1.48 (H) x10 3/uL      Abs Eos Automated 0.00 x10 3/uL      Absolute Baso Automated 0.04 x10 3/uL      Absolute Immature Granulocyte 0.18 (H) x10 3/uL      Absolute NRBC 0.00 x10 3/uL           Recent CBC   Recent Labs      07/17/16   1514   RBC  4.85   Hgb  15.6   Hematocrit  45.0   MCV  92.8   MCH  32.2*   MCHC  34.7   RDW  16*   MPV  9.7     Recent BMP   Recent Labs      07/17/16   1515   Glucose  153*   BUN  14   Creatinine  1.7*   Calcium  8.7   Sodium  137   Potassium  4.7   Chloride  105   CO2  23       Rads:       Signed by: Tommas Olp

## 2016-07-18 NOTE — Plan of Care (Signed)
Problem: Pain  Goal: Pain at adequate level as identified by patient  Outcome: Not Progressing  PRN oxycodone and morphine ineffectively managing pt's pain. Dr Georgian Co informed and requested anesthesiology consult for PCA. Dr Alto Denver ordered Dilaudid PCA 0.2/10/6. PCA initiated at 0600. Pt and wife educated on proper use and discontinuation of other opioids.    07/18/16 5784   Goal/Interventions addressed this shift   Pain at adequate level as identified by patient Reassess pain within 30-60 minutes of any procedure/intervention, per Pain Assessment, Intervention, Reassessment (AIR) Cycle;Evaluate if patient comfort function goal is met;Include patient/patient care companion in decisions related to pain management as needed       Problem: Altered GI Function  Goal: Elimination patterns are normal or improving  Outcome: Not Progressing  Bowel sounds present. Abdominal bloating and severe lower abdominal pain. Pain at rectum as well, verbalizing urge to have a BM. Scant drainage from lap sites.    07/18/16 6962   Goal/Interventions addressed this shift   Elimination patterns are normal or improving Report abnormal assessment to physician;Assess for normal bowel sounds;Monitor for abdominal distension;Monitor for abdominal discomfort       Problem: Bladder/Voiding  Goal: Remains continent  Outcome: Not Progressing  Foley catheter intact. Pt and wife educated on CAUTI and Foley care provided.   Urine light cherry color initially and has become progressively more clear.    07/18/16 9528   Goal/Interventions addressed this shift   Remains continent  Monitor intake and output

## 2016-07-18 NOTE — Op Note (Signed)
Procedure Date: 07/17/2016     Patient Type: I     SURGEON: Campbell Lerner MD  ASSISTANT:       ASSISTANT:  Keturah Barre, CSA      PREOPERATIVE DIAGNOSES:  1.  Intestinal adhesions.  2.  Enterotomy.     POSTOPERATIVE DIAGNOSES:  1.  Intestinal adhesions.  2.  Enterotomy.     TITLE OF PROCEDURE:  1.  Laparoscopic lysis of adhesions.  2.  Robotic laparoscopic enterotomy repair x2.     ANESTHESIA:  General.     INDICATION:  This is a 56 year old male with prostate cancer undergoing a robotic  prostatectomy.  Intraoperative consultation was requested due to placement  of the initial trocar into the small bowel.  The patient has a previous  history of exploratory laparotomy, splenectomy, and bowel resection, status  post a trauma, and I was called to the operating room after the case was  underway.     DESCRIPTION OF PROCEDURE:  I came into the operating room and observed the enterotomy.  I scrubbed in  to the operation.  A lateral 5 mm trocar was placed and the enterotomy was  inspected.  It was directly underlying the midline scar with the initial 12  mm trocar entering the bowel.  Several additional right lateral trocars  were placed to allow for this area to be further approached, and this was  done laparoscopically utilizing cold scissors to dissect the small bowel  away from the lower abdominal midline.  Once the area trocar was  identified, the bowel was cleared from this area and the trocar was removed  and the skin was clamped with towel clips to maintain a pneumoperitoneum.   Further adhesiolysis was then performed in low abdominal midline.  Another  area of a suspected enterotomy which occurred during the adhesiolysis was  identified as the adhesions to the abdominal midline were dense along the  area of the previous fascial closure.  Once the adhesions were fully lysed,  additional trocars were placed for the robotic component of the operation,  and the patient was placed in Trendelenburg position.   The robot was  brought over to the patient and docked.  I then scrubbed out to the  console.  The 2 areas of enterotomy were then repaired.  First, each site  was repaired with a running 3-0 V-Loc absorbable suture in a Lembert type  fashion, bringing the bowel together to close each enterotomy in a  longitudinal fashion.  Once the enterotomy repairs were completed, the  sutures were cut and the needles were removed from the abdomen.  There was  no gross spillage noted.  With this procedure now completed, the case was  then turned back over to Dr. Shellee Milo for performance of the prostatectomy.   Please refer to his operative report for details of that procedure.  Note  that during the procedure that I performed [name] provided  continuous dynamic retraction and assistance with exposure, thus  facilitating performance of the operation.           D:  07/17/2016 21:46 PM by Dr. Campbell Lerner, MD [phone])  T:  07/18/2016 09:24 AM by NTS      (Conf: [id number]) (Doc ID: [id number])

## 2016-07-18 NOTE — Progress Notes (Signed)
This CM met with pt, introduced self and CM role, an understanding was stated.  Contact information provided.  Demographic and emergency contact confirmed.  Pt is A&O x3, independent w/ ADL's, lives w/ spouse in a multi level TH, bedroom is on the top level.  Pt's bed will be moved to the main level prior to discharge. Pt does not use DME, no hx of home health services or SNF placement.  No home health d/c needs identified at this time.     This CM is available as needed for discharge planning.       07/18/16 1411   Patient Type   Within 30 Days of Previous Admission? No   Healthcare Decisions   Interviewed: Patient   Orientation/Decision Making Abilities of Patient Alert and Oriented x3, able to make decisions   Advance Directive Patient does not have advance directive   Healthcare Agent Appointed No   Prior to admission   Prior level of function Independent with ADLs   Type of Residence Private residence   Home Layout Multi-level;Bed/bath upstairs  [facility])   How do you get to your MD appointments? spouse or friends if necessary   How do you get your groceries? spouse   Who fixes your meals? spouse   Who does your laundry? spouse   Who picks up your prescriptions? spouse   Dressing Independent   Grooming Independent   Feeding Independent   Bathing Independent   Toileting Independent   Name of Prior Assisted Living Facility none   Home Care/Community Services None   Prior SNF admission? (Detail) none   Prior Rehab admission? (Detail) none   Adult Protective Services (APS) involved? No   Discharge Planning   Support Systems Spouse/significant other;Friends/neighbors   Patient expects to be discharged to: Home   Anticipated [place] plan discussed with: Same as interviewed   Mode of transportation: Private car (family member)   Consults/Providers   PT Evaluation Needed 2   OT Evalulation Needed 2   SLP Evaluation Needed 2   Outcome Palliative Care Screen Screened but did not meet criteria for intervention   Correct PCP listed  in Epic? Yes   Important Message from Medicare Notice   Patient received 1st IMM Letter? n/a   Derald Macleod RN, CM  [phone]

## 2016-07-18 NOTE — Plan of Care (Signed)
Problem: Moderate/High Fall Risk Score >5  Goal: Patient will remain free of falls  Outcome: Progressing  Fall precautions maintained, no fall or injury.    Problem: Bladder/Voiding  Goal: Patient will experience proper bladder emptying during admission  Outcome: Progressing  Patient with Foley catheter which is to remain in place for 2 weeks per Dr. Shellee Milo, urine is clear and light pink

## 2016-07-18 NOTE — Progress Notes (Signed)
Pt interviewed and except for sore throat for which lozenges have been ordered prn & pt informed that this may last a few days.  Nausea DOS- now resolved+ voiding    Pain flow sheet reviewed. Pt interviewed.    Mental status: awake and oriented  Respiratory function: stable  Pain not well controlled under current regimen.   Side effects: none  Nurse called earlier and reported patient had pain 10/10.  Clinician bolus 1 mg Dilaudid ordered every 2 hours prn and bolus dose increased to 0.3 mg every 10 minutes  Patient  now reports pain at 8/10, some improvement.  Pain is RLQ + LLQ abdominal quadrants and rectal area- patient describes as constant.  Plan to increase clinician bolus to 1 mg every 1 hour prn.  Patient also has Tylenol 325 mg every 4 hours prn.  Changes made to IV PCA orders  Please see new orders.

## 2016-07-19 LAB — CBC
Absolute NRBC: 0 10*3/uL
Hematocrit: 32.2 % — ABNORMAL LOW (ref 42.0–52.0)
Hgb: 10.8 g/dL — ABNORMAL LOW (ref 13.0–17.0)
MCH: 32.2 pg — ABNORMAL HIGH (ref 28.0–32.0)
MCHC: 33.5 g/dL (ref 32.0–36.0)
MCV: 96.1 fL (ref 80.0–100.0)
MPV: 10.2 fL (ref 9.4–12.3)
Nucleated RBC: 0 /100 WBC (ref 0.0–1.0)
Platelets: 238 10*3/uL (ref 140–400)
RBC: 3.35 10*6/uL — ABNORMAL LOW (ref 4.70–6.00)
RDW: 16 % — ABNORMAL HIGH (ref 12–15)
WBC: 15.91 10*3/uL — ABNORMAL HIGH (ref 3.50–10.80)

## 2016-07-19 LAB — BASIC METABOLIC PANEL
Anion Gap: 6 (ref 5.0–15.0)
BUN: 13 mg/dL (ref 9–28)
CO2: 29 mEq/L (ref 22–29)
Calcium: 8.4 mg/dL — ABNORMAL LOW (ref 8.5–10.5)
Chloride: 101 mEq/L (ref 100–111)
Creatinine: 1 mg/dL (ref 0.7–1.3)
Glucose: 84 mg/dL (ref 70–100)
Potassium: 4 mEq/L (ref 3.5–5.1)
Sodium: 136 mEq/L (ref 136–145)

## 2016-07-19 LAB — GFR: EGFR: >60

## 2016-07-19 LAB — HEMOGLOBIN AND HEMATOCRIT, BLOOD
Hematocrit: 33.5 % — ABNORMAL LOW (ref 42.0–52.0)
Hgb: 11.4 g/dL — ABNORMAL LOW (ref 13.0–17.0)

## 2016-07-19 MED ORDER — HYDROMORPHONE HCL 0.5 MG/0.5 ML IJ SOLN
0.2000 mg | INTRAMUSCULAR | Status: DC | PRN
Start: 2016-07-19 — End: 2016-07-20

## 2016-07-19 MED ORDER — HYDROMORPHONE HCL 0.5 MG/0.5 ML IJ SOLN
0.4000 mg | INTRAMUSCULAR | Status: DC | PRN
Start: 2016-07-19 — End: 2016-07-20
  Administered 2016-07-19: 0.4 mg via INTRAVENOUS
  Filled 2016-07-19: qty 0.5

## 2016-07-19 MED ORDER — HYDROMORPHONE HCL 4 MG PO TABS
4.0000 mg | ORAL_TABLET | ORAL | Status: DC | PRN
Start: 2016-07-19 — End: 2016-07-20
  Administered 2016-07-19 – 2016-07-20 (×8): 4 mg via ORAL
  Filled 2016-07-19 (×8): qty 1

## 2016-07-19 MED ORDER — HYDROMORPHONE HCL 1 MG/ML IJ SOLN
1.0000 mg | INTRAMUSCULAR | Status: DC | PRN
Start: 2016-07-19 — End: 2016-07-20

## 2016-07-19 MED ORDER — HYDROMORPHONE HCL-NACL 20-0.9 MG/100ML-% IV SOLN
INTRAVENOUS | Status: DC
Start: 2016-07-19 — End: 2016-07-19

## 2016-07-19 MED ORDER — HYDROMORPHONE HCL 2 MG PO TABS
2.0000 mg | ORAL_TABLET | ORAL | Status: DC | PRN
Start: 2016-07-19 — End: 2016-07-20
  Administered 2016-07-19: 2 mg via ORAL
  Filled 2016-07-19: qty 1

## 2016-07-19 NOTE — Plan of Care (Signed)
Problem: Safety  Goal: Patient will be free from injury during hospitalization  Outcome: Progressing   07/19/16 0044   Goal/Interventions addressed this shift   Patient will be free from injury during hospitalization  Assess patient's risk for falls and implement fall prevention plan of care per policy;Provide and maintain safe environment;Use appropriate transfer methods;Ensure appropriate safety devices are available at the bedside;Include patient/ family/ care giver in decisions related to safety;Hourly rounding;Assess for patients risk for elopement and implement Elopement Risk Plan per policy;Provide alternative method of communication if needed (communication boards, writing)   Patient in bed resting at moment, call bell and bedside table within reach, bed alarm on, wife at bedside, environmental safety ensured, hourly rounding in progress, patient encouraged to express needs,ambulates with minimal assist, in bed resting will continue to monitor patient.

## 2016-07-19 NOTE — Progress Notes (Signed)
Chart reviewed for settings on PCA and APS.      Mental status: awake and oriented  Respiratory function: stable  No complaints. Pain well controlled under current regimen.    No significant side effects.   Pain score 4/10 at rest and 8/10 with movement.  Pt tolerating PO.  PCA transitioned to PO pain meds.  Dilaudid 4 mg every 3 hours prn ordered by surgical team.  Please reconsult pain service as needed.

## 2016-07-19 NOTE — Plan of Care (Signed)
Problem: Safety  Goal: Patient will be free from injury during hospitalization  Outcome: Progressing   07/19/16 0032   Goal/Interventions addressed this shift   Patient will be free from injury during hospitalization  Assess patient's risk for falls and implement fall prevention plan of care per policy;Provide and maintain safe environment;Use appropriate transfer methods;Ensure appropriate safety devices are available at the bedside;Include patient/ family/ care giver in decisions related to safety;Hourly rounding;Assess for patients risk for elopement and implement Elopement Risk Plan per policy;Provide alternative method of communication if needed (communication boards, writing)   Patient in bed resting at moment, call bell and bedside table within reach, bed alarm on, environmental safety ensured, hourly rounding in progress, patient encouraged to express needs, in bed resting will continue to monitor patient.

## 2016-07-19 NOTE — Progress Notes (Signed)
PROGRESS NOTE    Date Time: 07/19/16 8:00 AM  Patient Name: [name],[name]      Assessment:   POD#2 RALP, bilateral lymph node dissection, LOA, enterotomy repair    Plan:   Recheck H&H around noon  advanced to full diet per GS  Trial of D/C PCA  Subjective:   Feels better. No N/V. No flatus    Medications:     Current Facility-Administered Medications   Medication Dose Route Frequency   . docusate sodium  100 mg Oral TID   . hydroCHLOROthiazide  25 mg Oral Daily   . valsartan  320 mg Oral Daily       Review of Systems:   A comprehensive review of systems was: History obtained from chart review and the patient    Physical Exam:     Vitals:    07/19/16 0757   BP: 142/67   Pulse: (!) 108   Resp: 18   Temp: 98.6 F (37 C)   SpO2: 94%       Intake and Output Summary (Last 24 hours) at Date Time    Intake/Output Summary (Last 24 hours) at 07/19/16 0800  Last data filed at 07/19/16 0600   Gross per 24 hour   Intake          3641.67 ml   Output             1250 ml   Net          2391.67 ml       General appearance - alert, well appearing, and in no distress  Mental status - alert, oriented to person, place, and time  Chest - no tachypnea, retractions or cyanosis  Heart - normal rate and regular rhythm  Abdomen - nondistended. appropriate tenderness. Incisions intact without erythema.  GU Male - foley with clear urine. scrotum with ecchymosis  Extremities - no pedal edema noted    Labs:     Results     Procedure Component Value Units Date/Time    Basic Metabolic Panel [[id number]]  (Abnormal) Collected:  07/19/16 0505    Specimen:  Blood Updated:  07/19/16 0609     Glucose 84 mg/dL      BUN 13 mg/dL      Creatinine 1.0 mg/dL      Calcium 8.4 (L) mg/dL      Sodium 409 mEq/L      Potassium 4.0 mEq/L      Chloride 101 mEq/L      CO2 29 mEq/L      Anion Gap 6.0    GFR [[id number]] Collected:  07/19/16 0505     Updated:  07/19/16 0609     EGFR >60.0    CBC without differential [[id number]]  (Abnormal) Collected:  07/19/16 0505     Specimen:  Blood from Blood Updated:  07/19/16 0553     WBC 15.91 (H) x10 3/uL      Hgb 10.8 (L) g/dL      Hematocrit 57.8 (L) %      Platelets 238 x10 3/uL      RBC 3.35 (L) x10 6/uL      MCV 96.1 fL      MCH 32.2 (H) pg      MCHC 33.5 g/dL      RDW 16 (H) %      MPV 10.2 fL      Nucleated RBC 0.0 /100 WBC      Absolute NRBC 0.00 x10 3/uL  Basic Metabolic Panel [[id number]]  (Abnormal) Collected:  07/18/16 0856    Specimen:  Blood Updated:  07/18/16 0944     Glucose 114 (H) mg/dL      BUN 13 mg/dL      Creatinine 1.2 mg/dL      Calcium 9.0 mg/dL      Sodium 409 mEq/L      Potassium 4.1 mEq/L      Chloride 100 mEq/L      CO2 29 mEq/L      Anion Gap 7.0    GFR [[id number]] Collected:  07/18/16 0856     Updated:  07/18/16 0944     EGFR >60.0    CBC without differential [[id number]]  (Abnormal) Collected:  07/18/16 0856    Specimen:  Blood from Blood Updated:  07/18/16 0921     WBC 20.26 (H) x10 3/uL      Hgb 14.1 g/dL      Hematocrit 57.8 %      Platelets 271 x10 3/uL      RBC 4.43 (L) x10 6/uL      MCV 95.0 fL      MCH 31.8 pg      MCHC 33.5 g/dL      RDW 16 (H) %      MPV 10.1 fL      Nucleated RBC 0.0 /100 WBC      Absolute NRBC 0.00 x10 3/uL           Recent CBC   Recent Labs      07/19/16   0505   RBC  3.35*   Hgb  10.8*   Hematocrit  32.2*   MCV  96.1   MCH  32.2*   MCHC  33.5   RDW  16*   MPV  10.2     Recent BMP   Recent Labs      07/19/16   0505   Glucose  84   BUN  13   Creatinine  1.0   Calcium  8.4*   Sodium  136   Potassium  4.0   Chloride  101   CO2  29       Rads:       Signed by: Tommas Olp

## 2016-07-19 NOTE — Plan of Care (Signed)
Problem: Safety  Goal: Patient will be free from injury during hospitalization  Outcome: Progressing  Bed at lowest position. Bed alarm on. Call bell and belongings within reach. Hourly rounding maintained to meet pt needs.    07/19/16 1434   Goal/Interventions addressed this shift   Patient will be free from injury during hospitalization  Assess patient's risk for falls and implement fall prevention plan of care per policy;Provide and maintain safe environment;Include patient/ family/ care giver in decisions related to safety;Hourly rounding;Ensure appropriate safety devices are available at the bedside;Use appropriate transfer methods       Problem: Pain  Goal: Pain at adequate level as identified by patient  Outcome: Progressing  Pain managed w/ PO Dilaudid. Will monitor.    07/19/16 1434   Goal/Interventions addressed this shift   Pain at adequate level as identified by patient Identify patient comfort function goal;Assess for risk of opioid induced respiratory depression, including snoring/sleep apnea. Alert healthcare team of risk factors identified.;Assess pain on admission, during daily assessment and/or before any "as needed" intervention(s);Reassess pain within 30-60 minutes of any procedure/intervention, per Pain Assessment, Intervention, Reassessment (AIR) Cycle;Evaluate if patient comfort function goal is met;Evaluate patient's satisfaction with pain management progress       Problem: Bladder/Voiding  Goal: Patient will experience proper bladder emptying during admission  Outcome: Progressing  Pt w/ foley catheter

## 2016-07-20 ENCOUNTER — Other Ambulatory Visit: Payer: Self-pay

## 2016-07-20 MED ORDER — LEVOFLOXACIN 500 MG PO TABS
ORAL_TABLET | ORAL | 0 refills | Status: DC
Start: 2016-07-20 — End: 2016-11-02
  Filled 2016-07-20: qty 1, 1d supply, fill #0

## 2016-07-20 MED ORDER — HYDROMORPHONE HCL 2 MG PO TABS
ORAL_TABLET | ORAL | 0 refills | Status: DC
Start: 2016-07-20 — End: 2016-11-02
  Filled 2016-07-20: qty 20, 3d supply, fill #0

## 2016-07-20 MED ORDER — DSS 100 MG PO CAPS
100.0000 mg | ORAL_CAPSULE | Freq: Three times a day (TID) | ORAL | 0 refills | Status: AC
Start: 2016-07-20 — End: 2016-08-19

## 2016-07-20 NOTE — Plan of Care (Signed)
Problem: Moderate/High Fall Risk Score >5  Goal: Patient will remain free of falls  Outcome: Progressing   07/20/16 0356   OTHER   Moderate Risk (6-13) MOD-(V[facility] Only) Consider use of "STOP Call For Help" sign

## 2016-07-20 NOTE — UM Notes (Signed)
PATIENT NAME: [name],[name]   DOB: September 09, 1960   CONTINUED STAY REVIEW FOR: 07/20/16   NOTES TO REVIEWER:    This clinical review is based on/compiled from documentation provided by the treatment team within the patient's medical record.   UTILIZATION REVIEW CONTACT: Cherly Anderson. Barnie Alderman, RN, BSN  Clinical Case Manager  - Utilization Review   [facility]    [address]    [place], [place] [address]  NPI: [id number]  Tax ID: [id number]  Phone: [phone]  Fax: [phone]    Please use fax number [phone] to provide authorization for hospital services or to request additional information.        07/20/2016   Continues on Ortho/Surgical Unit, continued severe groin pain, ongoing pain management, on contact isolation.     Temp:  [96.7 F (35.9 C)-98.8 F (37.1 C)] 97.8 F (36.6 C)  Heart Rate:  [83-109] 109  Resp Rate:  [17-18] 17  BP: (120-163)/(62-87) 120/70    Attending Notes:  without BM  Pain control LABS:   None   MEDS:  HCTZ 25mg  QD   Diovan 320mg  QD     Xanax 1mg  Q6H PRN x 1   Dilaudid 4mg  Q3H PRN x 4  IMAGING:  None

## 2016-07-20 NOTE — Progress Notes (Signed)
POST OPERATIVE PROGRESS NOTE  [facility] Spectralink: [id number]    Date Time: 07/20/16 1:05 PM  Patient Name: [name],[name]      ASSESSMENT:   3 Days Post-Op S/P Procedure(s):  ROBOT ASSISTED, LAPAROSCOPIC, PROSTATECTOMY W/ PELVIC LYMPH NODE DISSECTION  PROSTATECTOMY, RADICAL  LAPAROSCOPIC, ENTEROLYSIS    Doing well. No acute events. + flatus. -BM. Tolerating PO intake.  PLAN:   - can advance to low fiber diet, d/c on Low fiber diet x 2 weeks  - cleared for discharge from GS perspective  -  Ambulation/IS      SUBJECTIVE:   The patient is doing well.  Post operative pain is well controlled with medications.  Current dietary status:  Diet low fiber and tolerating well.  Flatus: yes. BM:  no.  Additional complaints: none       OBJECTIVE:   Current Vitals:   Vitals:    07/20/16 1214   BP: 148/68   Pulse: 85   Resp: 18   Temp: (!) 96.5 F (35.8 C)   SpO2: 98%       Intake and Output Summary (Last 24 hours):  I/O last 3 completed shifts:  In: 3641.67 [I.V.:3641.67]  Out: 3825 [Urine:3825]    Labs:     Results     ** No results found for the last 24 hours. **          Rads:     Radiology Results (24 Hour)     ** No results found for the last 24 hours. **          Physical Exam:     Mental status - alert, oriented to person, place, and time  Chest - no tachypnea, retractions or cyanosis, breathing comfortably on room air, audibly clear respiraitons  Heart - normal rate and regular rhythm  Abdomen - soft, mildly/softly distended, appropriately tender, + BS x 4, no G/RT  Wound - clean, dry, no drainage, dermabond intact  Extremities - warm, well perfused      Signed by: Ruel Favors

## 2016-07-20 NOTE — Plan of Care (Signed)
Problem: Altered GI Function  Goal: Mobility/Activity is maintained at optimal level for patient  Outcome: Progressing   07/20/16 0357   Goal/Interventions addressed this shift   Mobility/activity is maintained at optimal level for patient Increase mobility as tolerated/progressive mobility;Encourage independent activity per ability;Plan activities to conserve energy, plan rest periods;Assess for changes in respiratory status, level of consciousness and/or development of fatigue

## 2016-07-20 NOTE — Progress Notes (Signed)
Pt ok for discharge per MD. Received and complies with all discharge instructions, prescriptions and follow up care. PIVs removed without difficulty. Instructions and supplies provided on foley care. Polk'd via wheelchair w/ family and all belongings.

## 2016-07-20 NOTE — Progress Notes (Signed)
PROGRESS NOTE    Date Time: 07/20/16 7:15 AM  Patient Name: [name]      Assessment:   POD#3 RALP, bilateral pelvic node dissection, LOA, enterotomy repair.    Plan:   Possible D/C home this afternoon/tonight    Subjective:   Tolerating full liquid diet  +Flatus without BM  Pain controlled with PO meds    Medications:     Current Facility-Administered Medications   Medication Dose Route Frequency   . docusate sodium  100 mg Oral TID   . hydroCHLOROthiazide  25 mg Oral Daily   . valsartan  320 mg Oral Daily       Review of Systems:   A comprehensive review of systems was: History obtained from chart review and the patient    Physical Exam:     Vitals:    07/20/16 0335   BP: 146/76   Pulse: 83   Resp: 18   Temp: (!) 96.7 F (35.9 C)   SpO2: 94%       Intake and Output Summary (Last 24 hours) at Date Time    Intake/Output Summary (Last 24 hours) at 07/20/16 0715  Last data filed at 07/20/16 0335   Gross per 24 hour   Intake                0 ml   Output             3250 ml   Net            -3250 ml       General appearance - alert, well appearing, and in no distress  Mental status - alert, oriented to person, place, and time  Chest - no tachypnea, retractions or cyanosis  Heart - no JVD  Abdomen - nondistended  GU Male - urine clear  Extremities - no pedal edema noted    Labs:     Results     Procedure Component Value Units Date/Time    Hemoglobin and hematocrit, blood [[id number]]  (Abnormal) Collected:  07/19/16 1212    Specimen:  Blood Updated:  07/19/16 1225     Hgb 11.4 (L) g/dL      Hematocrit 40.9 (L) %           Recent CBC   Recent Labs      07/19/16   1212   Hgb  11.4*   Hematocrit  33.5*     Recent BMP No results for input(s): GLU, BUN, CREAT, CA, NA, K, CL, CO2 in the last 24 hours.    Invalid input(s): AGAP    Rads:       Signed by: Tommas Olp

## 2016-07-20 NOTE — Plan of Care (Signed)
Problem: Pain  Goal: Pain at adequate level as identified by patient  Outcome: Progressing   07/20/16 0355   Goal/Interventions addressed this shift   Pain at adequate level as identified by patient Identify patient comfort function goal;Assess for risk of opioid induced respiratory depression, including snoring/sleep apnea. Alert healthcare team of risk factors identified.;Assess pain on admission, during daily assessment and/or before any "as needed" intervention(s);Reassess pain within 30-60 minutes of any procedure/intervention, per Pain Assessment, Intervention, Reassessment (AIR) Cycle;Evaluate if patient comfort function goal is met;Evaluate patient's satisfaction with pain management progress;Offer non-pharmacological pain management interventions

## 2016-07-20 NOTE — Plan of Care (Signed)
Problem: Safety  Goal: Patient will be free from injury during hospitalization  Outcome: Progressing   07/20/16 0353   Goal/Interventions addressed this shift   Patient will be free from injury during hospitalization  Assess patient's risk for falls and implement fall prevention plan of care per policy;Provide and maintain safe environment;Ensure appropriate safety devices are available at the bedside;Include patient/ family/ care giver in decisions related to safety;Hourly rounding

## 2016-07-20 NOTE — Plan of Care (Signed)
Problem: Safety  Goal: Patient will be free from infection during hospitalization  Outcome: Progressing   07/20/16 0354   Goal/Interventions addressed this shift   Free from Infection during hospitalization Assess and monitor for signs and symptoms of infection;Monitor lab/diagnostic results;Monitor all insertion sites (i.e. indwelling lines, tubes, urinary catheters, and drains)

## 2016-07-20 NOTE — Discharge Instr - AVS First Page (Addendum)
Admit Date: 07/17/2016   Discharge Date: 07/20/2016     Attending: Tommas Olp*   Surgeon: Surgeon(s):  Tommas Olp, MD  Lyndee Leo, MD  Campbell Lerner, MD      Procedure(s) (LRB):  ROBOT ASSISTED, LAPAROSCOPIC, PROSTATECTOMY W/ PELVIC LYMPH NODE DISSECTION (Bilateral)  PROSTATECTOMY, RADICAL (Bilateral)  LAPAROSCOPIC, ENTEROLYSIS (N/A)    DISCHARGE INSTRUCTIONS--INTESTINAL SURGERY    Patient Instructions after your intestinal surgery.  Please refer to the complete postop instructions for details.    WHAT TO EXPECT AT YOUR SURGICAL SITE:     Pink/reddish drainage, bruising, swelling/lump at incision(s) may occur and is normal.    CARE FOR THE INCISION:     Skin glue over your incision(s) will dissolve over the next two weeks.   If you have white tapes on the skin (steri-strips), these will fall off over 7-10 days   If you still have staples at the time of discharge, they will be removed at your follow up postoperative appointment.   Shower 24 hours after your surgery.  No tub bathing, or pool/ocean for 1 week.   Use an ice pack every 15-20 min intermittently for the first 48 hrs as needed.    DIET:     Keep up with your liquids.   If your appetite has returned, eat what your system can tolerate.    Avoid hard to digest material, such as seeds, nuts, raw vegetables or fruits with pulp, or other high-roughage foods, until your follow up visit    ACTIVITY:     If it hurts, don't do it!   Walking & stairs are encouraged.     Avoid strenuous physical activity & lifting until cleared by your surgeon.   You may drive again once you have stopped taking prescription medication for pain.      MEDICATION:     Resume all home medications.   Use over-the-counter pain medications or a prescribed narcotic for your discomfort as needed.     For constipation, an over-the-counter stool softener or laxative may be taken.    WHAT TO LOOK FOR:    Please call our office immediately at [phone] or  go to the ER if you develop any of the following:     Excessive drainage, bleeding, redness or swelling at or around the incision   Fever over 101F   Increased abdominal pain   Persistent nausea or vomiting   Difficulty with urination   Difficulty breathing, chest pain or calf pain    FOLLOW-UP:     We will see you in our office 1-2 weeks after your surgery.   If not already scheduled, please call [phone] to arrange your post op visit.

## 2016-07-21 NOTE — UM Notes (Signed)
PATIENT NAME: [name],[name]   PATIENT DOB: 14-Apr-1960, 56 y.o./56 y.o., male                     DISCHARGE DATE NOTIFICATION             PATIENT DISCHARGED ON: 07/20/2016 TO Home or Self Care              Approval received, case closed.       No further communication necessary,       Thank you so much,            Rexene Agent  Case Management  [facility]   [address]   [place], [place] [address]  Email: [email or web address]  [phone]  [phone] (Fax)    Please use fax number [phone] to provide authorization for hospital services or to request additional information.

## 2016-07-26 NOTE — Discharge Summary (Signed)
DISCHARGE NOTE    Date Time: 07/26/16 1:12 PM  Patient Name: [name]  Attending Physician: No att. providers found    Date of Admission:   07/17/2016    Date of Discharge:   07/20/2016    Reason for Admission:   Prostate CA [C61]  Prostate cancer [C61]    Problems:   Lists the present on admission hospital problems  Present on Admission:  . Prostate cancer      Hospital Problems:  Active Problems:    Prostate cancer      Problem Lists:  Patient Active Problem List   Diagnosis   . Cellulitis of left leg   . Hematoma   . HTN (hypertension)   . Unspecified infection of bone, shoulder region   . Elevated PSA   . Prostate cancer       Active Multi-disciplinary problems    Discharge Dx:   Same    Consultations:   Dr. Darnelle Bos for enterotomy repair    Procedures performed:   Robot assisted laparoscopic prostatectomy with bilateral pelvic node dissection, lysis of adhesions and enterotomy repair.    Hospital Course:   Patient with High Risks cT1c Gleason 9=5+4 prostate cancer and history of chronic anabolic steroid abuse. Patient underwent RALP. Patient with previous abdominal surgery and had bowel adhesed to anterior abdominal wall. Passage of camera trocar entered into small bowel adhesed to anterior abdominal wall. Dr. Darnelle Bos, general surgeon, was consulted and performed lysis of adhesions and enterotomy repair with robot assisted laparoscopy. RALP with bilateral pelvic lymph node dissection was then completed. Patient admitted and diet advanced as tolerated. On 07/20/16, he was clear for discharge tolerating diet and PO pain regiment.    Path returned with negative margins.     Discharge Medications:     Discharge Medication List as of 07/20/2016  2:06 PM      START taking these medications    Details   docusate sodium (COLACE) 100 MG capsule Take 1 capsule (100 mg total) by mouth 3 (three) times daily., Starting Thu 07/20/2016, Until Sat 08/19/2016, Print      HYDROmorphone (DILAUDID) 2 MG tablet Take One to two tablets  by mouth every four hours as needed for pain, Print      levoFLOXacin (LEVAQUIN) 500 MG tablet Take one pill by mouth the morning of catheter removal., Print         CONTINUE these medications which have NOT CHANGED    Details   saccharomyces boulardii (FLORASTOR) 250 MG capsule Take 250 mg by mouth daily., Historical Med      valsartan-hydroCHLOROthiazide (DIOVAN-HCT) 320-25 MG per tablet Take 1 tablet by mouth daily.    , Starting Wed 07/14/2015, Historical Med      ALPRAZolam (XANAX) 1 MG tablet TAKE 1 TABLET(S) EVERY DAY BY ORAL ROUTE FOR 90 DAYS., Historical Med         STOP taking these medications       tamsulosin (FLOMAX) 0.4 MG Cap                Discharge Instructions:       Follow-up with Dr. Shellee Milo in 1.5 weeks for catheter removal.      Signed by: Tommas Olp

## 2016-11-02 ENCOUNTER — Ambulatory Visit: Payer: No Typology Code available for payment source

## 2016-11-02 DIAGNOSIS — Z01818 Encounter for other preprocedural examination: Secondary | ICD-10-CM

## 2016-11-02 NOTE — Pre-Procedure Instructions (Addendum)
_______________INTERVIEW INFORMATION_____________   Completed PSS interview with the patient  [name], [name] Apr 20, 1960 [id number]    On 11/02/16 at 11:13 AM   for their procedure Procedure(s) with comments:   ARTHROPLASTY, KNEE, TOTAL - RIGHT TOTAL KNEE REPLACEMENT   Currently scheduled for 11/13/2016  0730    with Estevan Oaks, MD at [facility]   [facility] TOWER MAIN PERIOPERATIVE   [address]   [place] [address]   Loc: [phone]   Arrival Time Verified: Yes (0530 read back)  NPO Status Reinforced: NPO instructions per surgeon (2300 read back)   [facility] [address] Entrance, pass the second Stop sign, Surgical Center Lobby on Right  _______________NAVIGATOR COMMUNICATION_________  TESTING INDICATED  Anesthesia Guidelines-    diuretic=electrolytes, tkr=cbc, bmp, t&s, pt/inr, ekg, ua, mssa; hx mrsa=mrsa x 2  Surgeon Requested-   Labs, ekg, cxr, med clearance, dental clearance  TESTING IN EPIC   05/02/16 MRSA +   07/19/16 h&h,  bmp WDL cbc ABN   07/14/16 pt/aptt/inr, cmp WDL   07/07/16 ua ABN, urine c&s WDL   07/20/16 [place] summary-prostate ca   04/24/16 ekg WDL  OUTSIDE TESTING COMPLETED   06/2016 dental clearance with Dr Felipa Furnace ([place]   10/26/2016 medical clearance with pmd  FUTURE TESTING/APPOINTMENTS   Pt coming to PSS tomorrow or Monday and the following day for the second MRSA swab and labs, ekg, has orders in hand from surgeon, t&s, mrsa swabs ordered in epic.  RECORDS REQUESTED/NOTIFICATIONS MADE   Requested med clearance from pmd, dental clearance from dentist   Notified none   Pt declined joint class  _______________ADDITIONAL PATIENT INSTRUCTIONS_____________  Patient does not have a CPAP  Patient instructed they will need a responsible adult with them for the 24 hours after surgery  Patient instructed that Sleep Apnea may prolong recovery time Documents referenced here were faxed via remote faxing equipment that does not provide printed  verification.   Each document was visualized as it was dialed, answered, and transmitted.

## 2016-11-07 ENCOUNTER — Ambulatory Visit: Payer: No Typology Code available for payment source | Attending: Orthopaedic Surgery

## 2016-11-07 DIAGNOSIS — Z01818 Encounter for other preprocedural examination: Secondary | ICD-10-CM

## 2016-11-07 LAB — BASIC METABOLIC PANEL
BUN: 17 mg/dL (ref 9.0–28.0)
CO2: 27 mEq/L (ref 21–29)
Calcium: 9.7 mg/dL (ref 8.5–10.5)
Chloride: 101 mEq/L (ref 100–111)
Creatinine: 1.3 mg/dL (ref 0.5–1.5)
Glucose: 95 mg/dL (ref 70–100)
Potassium: 4.6 mEq/L (ref 3.5–5.1)
Sodium: 137 mEq/L (ref 136–145)

## 2016-11-07 LAB — URINALYSIS
Bilirubin, UA: NEGATIVE
Blood, UA: NEGATIVE
Glucose, UA: NEGATIVE
Ketones UA: NEGATIVE
Leukocyte Esterase, UA: NEGATIVE
Nitrite, UA: NEGATIVE
Protein, UR: NEGATIVE
Specific Gravity UA: 1.011 (ref 1.001–1.035)
Urine pH: 6.5 (ref 5.0–8.0)
Urobilinogen, UA: 0.2

## 2016-11-07 LAB — GFR: EGFR: 57.2

## 2016-11-07 LAB — CBC
Absolute NRBC: 0 10*3/uL
Hematocrit: 49.6 % (ref 42.0–52.0)
Hgb: 16.3 g/dL (ref 13.0–17.0)
MCH: 31.5 pg (ref 28.0–32.0)
MCHC: 32.9 g/dL (ref 32.0–36.0)
MCV: 95.8 fL (ref 80.0–100.0)
MPV: 10.7 fL (ref 9.4–12.3)
Nucleated RBC: 0 /100 WBC (ref 0.0–1.0)
Platelets: 362 10*3/uL (ref 140–400)
RBC: 5.18 10*6/uL (ref 4.70–6.00)
RDW: 17 % — ABNORMAL HIGH (ref 12–15)
WBC: 7.85 10*3/uL (ref 3.50–10.80)

## 2016-11-07 LAB — PT AND APTT
PT INR: 0.9 (ref 0.9–1.1)
PT: 12.1 s — ABNORMAL LOW (ref 12.6–15.0)
PTT: 32 s (ref 23–37)

## 2016-11-07 LAB — ECG 12-LEAD
Atrial Rate: 74 {beats}/min
P Axis: 56 degrees
P-R Interval: 134 ms
Q-T Interval: 376 ms
QRS Duration: 98 ms
QTC Calculation (Bezet): 417 ms
R Axis: 81 degrees
T Axis: 27 degrees
Ventricular Rate: 74 {beats}/min

## 2016-11-07 LAB — TYPE AND SCREEN
AB Screen Gel: NEGATIVE
ABO Rh: O POS

## 2016-11-07 LAB — HEMOGLOBIN A1C
Average Estimated Glucose: 114 mg/dL
Hemoglobin A1C: 5.6 % (ref 4.6–5.9)

## 2016-11-07 LAB — HEMOLYSIS INDEX: Hemolysis Index: 7 (ref 0–18)

## 2016-11-07 NOTE — Pre-Procedure Instructions (Signed)
   Call to dentist Felipa Furnace [phone] requesting clearance to be faxed to [phone]   Fax to [facility] requesting pre-op, CXR, EKG and labs

## 2016-11-08 ENCOUNTER — Other Ambulatory Visit: Payer: Self-pay

## 2016-11-08 ENCOUNTER — Ambulatory Visit: Payer: No Typology Code available for payment source | Attending: Orthopaedic Surgery

## 2016-11-08 DIAGNOSIS — Z01818 Encounter for other preprocedural examination: Secondary | ICD-10-CM

## 2016-11-08 NOTE — Pre-Procedure Instructions (Signed)
Reviewed labs and EKG.-chest pending

## 2016-11-09 NOTE — Pre-Procedure Instructions (Addendum)
   VM message left at PMP office to fax H&P/Med Clearance to [phone].    Called Dr. Felipa Furnace, DDS office, dental clearance to be fax'd to [phone].

## 2016-11-09 NOTE — Pre-Procedure Instructions (Addendum)
Reviewed 11/08/16 positive mrsa/mssa-resulted faxed and called to surgeon office. LM to call back to PSS to inform him of isolation status on day of surgery.     Addendum:  Incoming call from patient.  He is aware of the +MRSA/MSSA and isolation status.  He reports the surgeon has called in Rx for bactroban.

## 2016-11-10 NOTE — Pre-Procedure Instructions (Signed)
Called PCP office, spoke to [place]. She will fax the preop to [phone].

## 2016-11-10 NOTE — Pre-Procedure Instructions (Signed)
   Call received from PCP, medical assistant Whitney said pt at pcp for preop eval, asked EKG of 11/07/16 to be faxed to  [phone]. EKG  Faxed as requested

## 2016-11-13 ENCOUNTER — Inpatient Hospital Stay
Admission: RE | Admit: 2016-11-13 | Discharge: 2016-11-14 | DRG: 941 | Disposition: A | Discharge status: Home Health | Payer: No Typology Code available for payment source | Source: Ambulatory Visit | Attending: Orthopaedic Surgery | Admitting: Orthopaedic Surgery

## 2016-11-13 ENCOUNTER — Observation Stay: Payer: No Typology Code available for payment source | Admitting: Anesthesiology

## 2016-11-13 ENCOUNTER — Encounter
Admission: RE | Disposition: A | Discharge status: Home Health | Payer: Self-pay | Source: Ambulatory Visit | Attending: Orthopaedic Surgery

## 2016-11-13 DIAGNOSIS — G8918 Other acute postprocedural pain: Principal | ICD-10-CM | POA: Diagnosis present

## 2016-11-13 DIAGNOSIS — M65862 Other synovitis and tenosynovitis, left lower leg: Secondary | ICD-10-CM | POA: Diagnosis present

## 2016-11-13 DIAGNOSIS — F419 Anxiety disorder, unspecified: Secondary | ICD-10-CM | POA: Diagnosis present

## 2016-11-13 DIAGNOSIS — M1711 Unilateral primary osteoarthritis, right knee: Secondary | ICD-10-CM | POA: Diagnosis present

## 2016-11-13 DIAGNOSIS — I10 Essential (primary) hypertension: Secondary | ICD-10-CM | POA: Diagnosis present

## 2016-11-13 DIAGNOSIS — M179 Osteoarthritis of knee, unspecified: Secondary | ICD-10-CM | POA: Diagnosis present

## 2016-11-13 DIAGNOSIS — M25462 Effusion, left knee: Secondary | ICD-10-CM | POA: Diagnosis present

## 2016-11-13 HISTORY — PX: ARTHROPLASTY, KNEE, TOTAL: SHX3134

## 2016-11-13 SURGERY — ARTHROPLASTY, KNEE, TOTAL
Anesthesia: Regional | Site: Knee | Laterality: Right | Wound class: Clean

## 2016-11-13 MED ORDER — ONDANSETRON HCL 4 MG/2ML IJ SOLN
4.0000 mg | Freq: Once | INTRAMUSCULAR | Status: DC | PRN
Start: 2016-11-13 — End: 2016-11-13

## 2016-11-13 MED ORDER — HYDROCODONE-ACETAMINOPHEN 5-325 MG PO TABS
ORAL_TABLET | ORAL | Status: AC
Start: 2016-11-13 — End: 2016-11-13
  Administered 2016-11-13: 13:00:00 1 via ORAL
  Filled 2016-11-13: qty 1

## 2016-11-13 MED ORDER — HYDROMORPHONE HCL 0.5 MG/0.5 ML IJ SOLN
0.4000 mg | INTRAMUSCULAR | Status: DC | PRN
Start: 2016-11-13 — End: 2016-11-13
  Administered 2016-11-13: 0.4 mg via INTRAVENOUS
  Filled 2016-11-13: qty 1

## 2016-11-13 MED ORDER — LACTATED RINGERS IV SOLN
75.0000 mL/h | INTRAVENOUS | Status: AC
Start: 2016-11-13 — End: 2016-11-14
  Filled 2016-11-13: qty 1000

## 2016-11-13 MED ORDER — FERROUS SULFATE 324 (65 FE) MG PO TBEC
324.0000 mg | DELAYED_RELEASE_TABLET | Freq: Every morning | ORAL | Status: DC
Start: 2016-11-13 — End: 2016-11-14
  Administered 2016-11-13 – 2016-11-14 (×2): 324 mg via ORAL
  Filled 2016-11-13 (×2): qty 1

## 2016-11-13 MED ORDER — NALOXONE HCL 0.4 MG/ML IJ SOLN (WRAP)
0.4000 mg | INTRAMUSCULAR | Status: DC | PRN
Start: 2016-11-13 — End: 2016-11-14

## 2016-11-13 MED ORDER — VANCOMYCIN 1000 MG IN 250 ML NS IVPB VIAL-MATE (CNR)
1000.0000 mg | Freq: Once | INTRAVENOUS | Status: AC
Start: 2016-11-13 — End: 2016-11-13
  Administered 2016-11-13: 17:00:00 1000 mg via INTRAVENOUS
  Filled 2016-11-13: qty 250

## 2016-11-13 MED ORDER — HYDROCODONE-ACETAMINOPHEN 5-325 MG PO TABS
1.0000 | ORAL_TABLET | Freq: Once | ORAL | Status: DC | PRN
Start: 2016-11-13 — End: 2016-11-13

## 2016-11-13 MED ORDER — ONDANSETRON HCL 4 MG/2ML IJ SOLN
4.0000 mg | Freq: Three times a day (TID) | INTRAMUSCULAR | Status: DC | PRN
Start: 2016-11-13 — End: 2016-11-14

## 2016-11-13 MED ORDER — HYDROMORPHONE HCL 4 MG PO TABS
4.0000 mg | ORAL_TABLET | ORAL | Status: DC | PRN
Start: 2016-11-13 — End: 2016-11-14
  Administered 2016-11-13 – 2016-11-14 (×4): 4 mg via ORAL
  Filled 2016-11-13 (×4): qty 1

## 2016-11-13 MED ORDER — ROPIVACAINE HCL 2 MG/ML IJ SOLN
INTRAMUSCULAR | Status: DC | PRN
Start: 2016-11-13 — End: 2016-11-13
  Administered 2016-11-13: 20 mL via PERINEURAL

## 2016-11-13 MED ORDER — VITAMIN C 500 MG PO TABS
500.0000 mg | ORAL_TABLET | Freq: Every day | ORAL | Status: DC
Start: 2016-11-13 — End: 2016-11-14
  Administered 2016-11-13 – 2016-11-14 (×2): 500 mg via ORAL
  Filled 2016-11-13 (×2): qty 1

## 2016-11-13 MED ORDER — LIDOCAINE HCL 2 % IJ SOLN
INTRAMUSCULAR | Status: DC | PRN
Start: 2016-11-13 — End: 2016-11-13
  Administered 2016-11-13: 100 mg

## 2016-11-13 MED ORDER — PROPOFOL INFUSION 10 MG/ML
INTRAVENOUS | Status: DC | PRN
Start: 2016-11-13 — End: 2016-11-13
  Administered 2016-11-13: 120 ug/kg/min via INTRAVENOUS

## 2016-11-13 MED ORDER — HYDROMORPHONE HCL 2 MG PO TABS
2.0000 mg | ORAL_TABLET | ORAL | Status: DC | PRN
Start: 2016-11-13 — End: 2016-11-13

## 2016-11-13 MED ORDER — MIDAZOLAM HCL 2 MG/2ML IJ SOLN
INTRAMUSCULAR | Status: AC
Start: 2016-11-13 — End: ?
  Filled 2016-11-13: qty 2

## 2016-11-13 MED ORDER — MEPERIDINE HCL 25 MG/ML IJ SOLN
12.5000 mg | INTRAMUSCULAR | Status: DC | PRN
Start: 2016-11-13 — End: 2016-11-13

## 2016-11-13 MED ORDER — ROPIVACAINE HCL 5 MG/ML IJ SOLN
Freq: Once | INTRAMUSCULAR | Status: AC
Start: 2016-11-13 — End: 2016-11-13
  Administered 2016-11-13: 09:00:00 40 mL via INTRA_ARTICULAR
  Filled 2016-11-13: qty 30

## 2016-11-13 MED ORDER — DIPHENHYDRAMINE HCL 25 MG PO CAPS
25.0000 mg | ORAL_CAPSULE | Freq: Two times a day (BID) | ORAL | Status: DC | PRN
Start: 2016-11-13 — End: 2016-11-14

## 2016-11-13 MED ORDER — FENTANYL CITRATE (PF) 50 MCG/ML IJ SOLN (WRAP)
INTRAMUSCULAR | Status: AC
Start: 2016-11-13 — End: ?
  Filled 2016-11-13: qty 2

## 2016-11-13 MED ORDER — ONDANSETRON 4 MG PO TBDP
4.0000 mg | ORAL_TABLET | Freq: Three times a day (TID) | ORAL | Status: DC | PRN
Start: 2016-11-13 — End: 2016-11-14

## 2016-11-13 MED ORDER — HYDROCHLOROTHIAZIDE 25 MG PO TABS
25.0000 mg | ORAL_TABLET | Freq: Every day | ORAL | Status: DC
Start: 2016-11-13 — End: 2016-11-14
  Administered 2016-11-14: 10:00:00 25 mg via ORAL
  Filled 2016-11-13 (×2): qty 1

## 2016-11-13 MED ORDER — SODIUM CHLORIDE 0.9 % IR SOLN
Status: DC | PRN
Start: 2016-11-13 — End: 2016-11-13
  Administered 2016-11-13: 1000 mL

## 2016-11-13 MED ORDER — LIDOCAINE HCL (PF) 1 % IJ SOLN
INTRAMUSCULAR | Status: AC
Start: 2016-11-13 — End: ?
  Filled 2016-11-13: qty 5

## 2016-11-13 MED ORDER — HYDROMORPHONE HCL 4 MG PO TABS
4.0000 mg | ORAL_TABLET | ORAL | Status: DC | PRN
Start: 2016-11-13 — End: 2016-11-13

## 2016-11-13 MED ORDER — LACTATED RINGERS IV SOLN
INTRAVENOUS | Status: DC | PRN
Start: 2016-11-13 — End: 2016-11-13

## 2016-11-13 MED ORDER — OXYCODONE-ACETAMINOPHEN 5-325 MG PO TABS
1.0000 | ORAL_TABLET | ORAL | Status: DC | PRN
Start: 2016-11-13 — End: 2016-11-14
  Administered 2016-11-13: 1 via ORAL
  Filled 2016-11-13: qty 1

## 2016-11-13 MED ORDER — ONDANSETRON HCL 4 MG/2ML IJ SOLN
INTRAMUSCULAR | Status: DC | PRN
Start: 2016-11-13 — End: 2016-11-13
  Administered 2016-11-13: 4 mg via INTRAVENOUS

## 2016-11-13 MED ORDER — VALSARTAN 160 MG PO TABS
320.0000 mg | ORAL_TABLET | Freq: Every day | ORAL | Status: DC
Start: 2016-11-13 — End: 2016-11-14
  Administered 2016-11-14: 10:00:00 320 mg via ORAL
  Filled 2016-11-13 (×2): qty 2

## 2016-11-13 MED ORDER — PROMETHAZINE HCL 25 MG/ML IJ SOLN
6.2500 mg | INTRAMUSCULAR | Status: DC | PRN
Start: 2016-11-13 — End: 2016-11-13
  Filled 2016-11-13: qty 1

## 2016-11-13 MED ORDER — BACITRACIN 50000 UNITS IM SOLR
INTRAMUSCULAR | Status: DC | PRN
Start: 2016-11-13 — End: 2016-11-13
  Administered 2016-11-13: 50000 [IU]

## 2016-11-13 MED ORDER — FENTANYL CITRATE (PF) 50 MCG/ML IJ SOLN (WRAP)
25.0000 ug | INTRAMUSCULAR | Status: DC | PRN
Start: 2016-11-13 — End: 2016-11-13

## 2016-11-13 MED ORDER — MIDAZOLAM HCL 2 MG/2ML IJ SOLN
INTRAMUSCULAR | Status: DC | PRN
Start: 2016-11-13 — End: 2016-11-13
  Administered 2016-11-13: 2 mg via INTRAVENOUS

## 2016-11-13 MED ORDER — LACTATED RINGERS IV SOLN
INTRAVENOUS | Status: DC
Start: 2016-11-13 — End: 2016-11-14

## 2016-11-13 MED ORDER — PHENYLEPHRINE 100 MCG/ML IN NACL 0.9% IV SOSY
PREFILLED_SYRINGE | INTRAVENOUS | Status: AC
Start: 2016-11-13 — End: ?
  Filled 2016-11-13: qty 5

## 2016-11-13 MED ORDER — ACETAMINOPHEN 325 MG PO TABS
650.0000 mg | ORAL_TABLET | ORAL | Status: DC | PRN
Start: 2016-11-13 — End: 2016-11-14
  Administered 2016-11-13: 650 mg via ORAL
  Filled 2016-11-13: qty 2

## 2016-11-13 MED ORDER — PHENYLEPHRINE 100 MCG/ML IN NACL 0.9% IV SOSY
PREFILLED_SYRINGE | INTRAVENOUS | Status: DC | PRN
Start: 2016-11-13 — End: 2016-11-13
  Administered 2016-11-13 (×5): 100 ug via INTRAVENOUS

## 2016-11-13 MED ORDER — ALPRAZOLAM 0.5 MG PO TABS
1.0000 mg | ORAL_TABLET | Freq: Every evening | ORAL | Status: DC | PRN
Start: 2016-11-13 — End: 2016-11-14
  Administered 2016-11-13: 22:00:00 1 mg via ORAL
  Filled 2016-11-13: qty 2

## 2016-11-13 MED ORDER — HYDROMORPHONE HCL 1 MG/ML IJ SOLN
1.0000 mg | INTRAMUSCULAR | Status: DC | PRN
Start: 2016-11-13 — End: 2016-11-14
  Administered 2016-11-13 – 2016-11-14 (×8): 1 mg via INTRAVENOUS
  Filled 2016-11-13 (×8): qty 1

## 2016-11-13 MED ORDER — LIDOCAINE HCL (PF) 2 % IJ SOLN
INTRAMUSCULAR | Status: AC
Start: 2016-11-13 — End: ?
  Filled 2016-11-13: qty 5

## 2016-11-13 MED ORDER — PROPOFOL 10 MG/ML IV EMUL (WRAP)
INTRAVENOUS | Status: AC
Start: 2016-11-13 — End: ?
  Filled 2016-11-13: qty 50

## 2016-11-13 MED ORDER — HYDROMORPHONE HCL 2 MG PO TABS
2.0000 mg | ORAL_TABLET | ORAL | Status: DC | PRN
Start: 2016-11-13 — End: 2016-11-14
  Administered 2016-11-13: 2 mg via ORAL
  Filled 2016-11-13: qty 1

## 2016-11-13 MED ORDER — OXYCODONE-ACETAMINOPHEN 5-325 MG PO TABS
2.0000 | ORAL_TABLET | ORAL | Status: DC | PRN
Start: 2016-11-13 — End: 2016-11-14
  Administered 2016-11-13 – 2016-11-14 (×2): 2 via ORAL
  Filled 2016-11-13 (×2): qty 2

## 2016-11-13 MED ORDER — ASPIRIN 325 MG PO TBEC
325.0000 mg | DELAYED_RELEASE_TABLET | Freq: Two times a day (BID) | ORAL | Status: DC
Start: 2016-11-13 — End: 2016-11-14
  Administered 2016-11-13 – 2016-11-14 (×2): 325 mg via ORAL
  Filled 2016-11-13 (×2): qty 1

## 2016-11-13 MED ORDER — SODIUM CHLORIDE 0.9 % IV SOLN
INTRAVENOUS | Status: DC
Start: 2016-11-13 — End: 2016-11-14

## 2016-11-13 MED ORDER — HYDROMORPHONE HCL 0.5 MG/0.5 ML IJ SOLN
0.5000 mg | INTRAMUSCULAR | Status: DC | PRN
Start: 2016-11-13 — End: 2016-11-13

## 2016-11-13 MED ORDER — VANCOMYCIN 1000 MG IN 250 ML NS IVPB VIAL-MATE (CNR)
1000.0000 mg | Freq: Once | INTRAVENOUS | Status: AC
Start: 2016-11-13 — End: 2016-11-13
  Administered 2016-11-13: 07:00:00 1000 mg via INTRAVENOUS

## 2016-11-13 MED ORDER — VALSARTAN 320 MG PO TABS
1.0000 | ORAL_TABLET | Freq: Every day | ORAL | Status: DC
Start: 2016-11-13 — End: 2016-11-13
  Filled 2016-11-13: qty 1

## 2016-11-13 MED ORDER — PROPOFOL INFUSION 10 MG/ML
INTRAVENOUS | Status: DC | PRN
Start: 2016-11-13 — End: 2016-11-13
  Administered 2016-11-13: 200 mg via INTRAVENOUS

## 2016-11-13 MED ORDER — PROPOFOL 10 MG/ML IV EMUL (WRAP)
INTRAVENOUS | Status: AC
Start: 2016-11-13 — End: ?
  Filled 2016-11-13: qty 20

## 2016-11-13 SURGICAL SUPPLY — 102 items
APPLCATOR CHLORAPREP 26ML (Prep) ×4 IMPLANT
BANDAGE ACE NONSTERILE 4IN LF (Bandage) ×1
BANDAGE COMPRESSION L5 YD X W4 IN ELASTIC HOOK LOOP CLOSURE STRETCH (Bandage) ×1 IMPLANT
BANDAGE ELASTIC SN 6IN (Procedure Accessories) ×2
BANDAGE MEDLINE COMPRESSION L5 YD X W4 (Bandage) ×1
BANDAGE PROCARE COMPRESSION L5 YD X W6 (Procedure Accessories) ×2
BANDAGE PROCARE COMPRESSION L5 YD X W6 IN 2 CLIP FASTENER BREATHABLE (Procedure Accessories) ×2 IMPLANT
BAR ORTHOPEDIC LOCK VANGUARD TITANIUM (Bar) ×1 IMPLANT
BAR ORTHOPEDIC LOCK VANGUARD TITANIUM TIBIA (Bar) ×1 IMPLANT
BASIN MAJOR SET (Kits) ×1
BEARING E1 VANGRD TIB 14X83MM (Bearing) ×1 IMPLANT
BEARING TIBIAL L83 MM X H12 MM KNEE (Insert) ×1 IMPLANT
BEARING TIBIAL L83 MM X H12 MM KNEE VANGUARD E1 ANTERIOR STABILIZE (Insert) ×1 IMPLANT
BEARING TIBIAL L83 MM X H14 MM KNEE (Bearing) ×1 IMPLANT
BEARING TIBIAL L83 MM X H14 MM KNEE VANGUARD E1 ANTERIOR STABILIZE (Bearing) ×1 IMPLANT
BLADE SAW MEDIUM 2 CUT TEETH (Blade) ×1
BLADE SAW MEDIUM 2 CUT TEETH NA (Blade) ×1 IMPLANT
BLADE SAW SAG DUAL CUT TEETH (Blade) ×1
BLADE SAW STRYKER VANGUARD XP KNEE (Blade) ×1
BLADE SAW STRYKER VANGUARD XP KNEE THK1.37 MM HYPER STABLECUT L90 MM X (Blade) ×1 IMPLANT
BLADE VANGRD 90X19/25X1.37MM (Blade) ×1
BNDG WEBRIL NONSTRL 4IN (Procedure Accessories) ×1
BUR MATCH 9CM X 3MM (Burr)
CARTRIDGE LUB DIFFUSER LEGEND (Ortho Supply)
CEMENT BONE SNGL PALACOS 1X40 (Cement) ×2 IMPLANT
CEMENT MIXER W/FEMERAL NOZZLE (Procedure Accessories) ×2 IMPLANT
COMPONENT FEMORAL L75 MM KNEE RIGHT CRUCIATE RETAIN CEMENT PRIMARY (Component) ×1 IMPLANT
COMPONENT FEMORAL L75 MM KNEE RT (Component) ×1 IMPLANT
COMPONENT PATELLAR OD37 MM 1 PEG WIRE (Patella) ×1 IMPLANT
COMPONENT PATELLAR OD37 MM 1 PEG WIRE ASCENT ARCOM KNEE ANTERIOR (Patella) ×1 IMPLANT
COMPONENT VANGRD CR FEMRL 75MM (Component) ×1 IMPLANT
CONTAINER SPEC PP 120ML BTIT SAMCO 48MM (Procedure Accessories) ×1
CONTAINER SPECIMAN STRLE120ML (Procedure Accessories) ×1
CONTAINER SPECIMEN BIO-TITE 120ML ORANGE SPECIMEN 48MM (Procedure Accessories) ×1 IMPLANT
COVER FLEXIBLE LIGHT HANDLE PLASTIC GREEN (Procedure Accessories) ×2 IMPLANT
COVER FLEXIBLE MEDLINE LIGHT HANDLE (Procedure Accessories) ×2
CVR LGHTHNDL FLEX SFT 1PK (Procedure Accessories) ×2
DRAPE LWR EXT 114X88X126IN (Drape) ×2 IMPLANT
DRAPE SURGICAL FANFOLD L98 IN X W72 IN (Procedure Accessories) ×4
DRAPE SURGICAL FANFOLD L98 IN X W72 IN CONVERTORS TIBURON LARGE (Procedure Accessories) ×4 IMPLANT
ELECTRODE ADULT PATIENT RETURN L9 FT REM POLYHESIVE ACRYLIC FOAM (Procedure Accessories) ×1 IMPLANT
ELECTRODE PATIENT RETURN L9 FT VALLEYLAB (Procedure Accessories) ×1
GAUZE SPONGE VERSLN 4PLY 4X4IN (Dressing) ×2 IMPLANT
GLOVE SURG BIOGEL INDIC SZ 7.0 (Glove) ×1
GLOVE SURG BIOGEL INDIC SZ 8.5 (Glove) ×6 IMPLANT
GLOVE SURG BIOGEL SZ6.5 (Glove) ×4 IMPLANT
GLOVE SURGICAL 7 BIOGEL INDICATOR POWDER (Glove) ×1
GLOVE SURGICAL 7 BIOGEL INDICATOR POWDER FREE SMOOTH BEAD CUFF (Glove) ×1 IMPLANT
GOWN STANDARD XLG W/ PAPER TWL (Gown) ×1 IMPLANT
GOWN STNDRD XLG (W/ PAPER TWL) (Gown) ×2
INSERT VNGRD TIB ASB 12X83MM (Insert) ×1 IMPLANT
INSTRUMENT SCULPTING BONE CEM (Ortho Supply) ×1
KIT JOINT TOTAL IFH (Kits) ×2 IMPLANT
LOCKING BAR TIB (Bar) ×1 IMPLANT
LUBRICANT INSTRUMENT DIFFUSER CARTRIDGE (Ortho Supply)
LUBRICANT INSTRUMENT DIFFUSER CARTRIDGE MIDAS REX MR7 (Ortho Supply) IMPLANT
NEEDLE QUINCKE SPNL 22GA 3.5IN (Needles) ×2
NEEDLE REG BEVEL 19GX1.5IN (Needles) ×2 IMPLANT
NEEDLE SPINAL BD OD22 GA L3 1/2 IN (Needles) ×2
NEEDLE SPINAL L3 1/2 IN REGULAR WALL QUINCKE TIP OD22 GA BD (Needles) ×2 IMPLANT
PAD ELECTROSRG GRND REM W CRD (Procedure Accessories) ×1
PADDING CAST L4 YD X W4 IN COHESION HAND TEARABLE SELF BOND SPECIALIST (Procedure Accessories) ×1 IMPLANT
PADDING CAST L4 YD X W6 IN UNDERCAST (Cast) ×1
PADDING CAST L4 YD X W6 IN UNDERCAST MILD STRETCH COHESIVE WEBRIL (Cast) ×1 IMPLANT
PADDING CST CTTN SPCLST 100 4YDX4IN LF (Procedure Accessories) ×1
PADNG CAST 6IN STRL (Cast) ×1
PATELLAR SINGLE PEG ARCOM 37MM (Patella) ×1 IMPLANT
PILLOW ABDUCTION L36 IN X W30 1/2 IN (Immobilizer) ×1
PILLOW ABDUCTION L36 IN X W30 1/2 IN LARGE ADJUSTABLE WRIST HUMERAL (Immobilizer) ×1 IMPLANT
PILLOW SHOULDER ABD LG (Immobilizer) ×1
PLATE INTRLK LCKG BAR 83MM (Plate) ×1 IMPLANT
POUCH INST 18X30CM (Drape) ×2 IMPLANT
SET SURGICAL BASIN MAJOR (Kits) ×1
SET SURGICAL BASIN MAJOR MEDLINE INDUSTRIES, INC. (Kits) ×1 IMPLANT
SHEET LARGE DRAPE 72X86IN (Procedure Accessories) ×4
SPONGE LAP XRAY 18X18IN (Sponge) ×1
SPONGE LAPAROTOMY L18 IN X W18 IN (Sponge) ×1
SPONGE LAPAROTOMY L18 IN X W18 IN PREWASH WHITE (Sponge) ×1 IMPLANT
SUTURE COATED VICRYL 1 CT-1 L27 IN BRAID (Suture) ×1
SUTURE COATED VICRYL 1 CT-1 L27 IN BRAID COATED UNDYED ABSORBABLE (Suture) ×1 IMPLANT
SUTURE ETHIBOND 1 CT1 30IN (Suture) ×1
SUTURE ETHIBOND EXCEL 1 CT-1 L30 IN (Suture) ×1
SUTURE ETHIBOND EXCEL 1 CT-1 L30 IN BRAID NONABSORBABLE (Suture) ×1 IMPLANT
SUTURE ETHILON 3-0 FS1 18IN (Suture) ×1
SUTURE ETHILON BLACK 3-0 FS-1 L18 IN (Suture) ×1
SUTURE ETHILON BLACK 3-0 FS-1 L18 IN MONOFILAMENT NONABSORBABLE (Suture) ×1 IMPLANT
SUTURE VICRYL 1 CT1 27IN (Suture) ×1
SUTURE VICRYL 2-0 CT1 36IN (Suture) ×2 IMPLANT
SYRINGE 20 ML BD LUER-LOK MEDICAL (Syringes, Needles) ×2 IMPLANT
SYRINGE LUER LOCK 10CC (Syringes, Needles) ×2 IMPLANT
SYRINGE LUER-LOK STERILE 20CC (Syringes, Needles) ×2
SYRINGE MED 20ML LL LF STRL (Syringes, Needles) ×2
SYSTEM BONE CEMENT SCULP PREPARATION KIT (Ortho Supply) ×1
SYSTEM BONE CEMENT SCULP PREPARATION KIT CURETTE (Ortho Supply) ×1 IMPLANT
TOOL DISSECTING L9 CM MATCH HEAD FLUTE (Burr)
TOOL DISSECTING L9 CM MATCH HEAD FLUTE OD3 MM MIDAS REX LEGEND (Burr) IMPLANT
TOURNIQUET 34IN STRL (Procedure Accessories) ×2 IMPLANT
TOWEL STERILE REUSABLE 8PK (Procedure Accessories) ×6 IMPLANT
TRAY TIBIAL KNEE ASCENT MAXIM CRUCIATE (Plate) ×1 IMPLANT
TRAY TIBIAL KNEE ASCENT MAXIM CRUCIATE FIN L83 MM INTERLOK COCR (Plate) ×1 IMPLANT
WAX BONE 2.5 G NATURAL (Hemostat) IMPLANT
WAX BONE 2.5GM (Hemostat)

## 2016-11-13 NOTE — Discharge Instr - AVS First Page (Addendum)
Reason for your Hospital Admission:  Right total knee replacement      Instructions for after your discharge:      Mixing alcohol and pain medications can cause dizziness and slow your breathing.    Don't drink alcoholic beverages while taking pain medications.     Do NOT use SNUFF                               Post-Operative Instructions for   Total Knee Replacement                      Aretta Nip, III, M.D.                                               [phone]      1.   Elevate the leg (from under the heel and ankle only) and apply an ice pack for 20           minutes every hour as needed to help reduce pain and swelling.    2. Use walker to walk.  You can put as much weight on your leg as you can tolerate. Wean from walker to a cane as tolerated.    3. Change your dressing daily using 4x4 gauze and an ACE bandage. Do not get your incision or bandage wet. No baths/hot tubs/pools for 4 weeks. Do not put any lotions/creams/ointments on incision.    4. You will need to wear compression stockings for 1 month after surgery.    5. You may not drive until evaluated by me in the office and I give you clearance.    6. You will have Home Physical Therapy for 2 weeks as set up in the hospital prior to your discharge. Schedule outpatient PT to begin as soon as possible after your first-operative appointment with Dr. Maurice March in approximately 2 weeks. An order will be given to you at this office visit, but you can call ahead to schedule these outpatient PT sessions at your preferred PT facility.     7. You will have DVT pumps and a CPM machine from [facility] for approximately 2 weeks. They will coordinate pick-up and drop-off of these items. If any questions or concerns, you can contact their office at [phone].    8. Take Aspirin 325 mg tablet twice daily for 2 weeks.     9. Take pain medication as directed for pain relief. Please call the office for prescription refills 1-2 days before you run  out. Pain medication will not be filled on weekends.  10.   Pain Medication Tips:  - Do not drive while taking pain medications.  - Do not drink alcoholic beverages while taking pain medications.  - Pain medication should be taken with food as this will help prevent any stomach upset.  - Often pain medications will cause constipation. Eat high fiber foods and increase your fluid intake if possible.   - To alleviate constipation, purchase a stool softener at any pharmacy and follow the recommended directions on the                             bottle.    11. Make a postoperative appointment with my office  within 10-14 days after surgery by calling [phone]. Make appointment with Ly at ext. #[phone].    12. If you develop severe pain, redness, swelling in the leg, difficulty breathing, or fever greater than 101.63F call my office. Fredderick Severance, physician assistant, can be reached at [phone] ext. [phone].    Home Health Discharge Information     Your doctor has ordered Physical Therapy in-home service(s) for you while you recuperate at home, to assist you in the transition from hospital to home.      The agency that you or your representative chose to provide the service:  Name of Home Health Agency: [facility] [phone])]      [employer]:  Name of DME Agency: [facility] [phone])]  Equipment Ordered:  CPM           Signed by: Rollen Sox  Date Time: 11/14/16 10:42 AM
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
## 2016-11-13 NOTE — H&P (Signed)
Pt evaluated today.     HEENT - ATNC  Lungs- Clear  ABDOMEN- benign  COR - RR , WNL  Neuro intact  EXT - painful ROM right knee, 5% flexion contracture, 5% varus deformity NVI    Xrays confirm DJD right knee,     Plan Right total knee    No Known allergies , hx of treated MRSA in past

## 2016-11-13 NOTE — Progress Notes (Signed)
DR Maurice March contacted direct pt was C/O pain 10/10 with oral and iv pain medicine, on call Dr called x 3 thru answering service.,no call back ,iv deluded 1 mg given dressing loosen per dr Shaune Pollack slimes ok after he talk to dr Maurice March.

## 2016-11-13 NOTE — PACU (Signed)
Dr. Annice Needy notified regarding increase left eye reddness, burning, and watering. Will continue to monitor.

## 2016-11-13 NOTE — Anesthesia Procedure Notes (Signed)
Peripheral    Patient location during procedure: PACU  Reason for block: Post-op pain managment  Injection technique: single-shot  Block Region: Adductor canal/Mid-thigh femoral  Laterality: Right  Block at surgeon's request Yes    Staffing  Anesthesiologist: Hessie Diener  Performed: Anesthesiologist     Pre-procedure Checklist   Completed: patient identified, surgical consent, pre-op evaluation, timeout performed, risks and benefits discussed, anesthesia consent given and correct site      Peripheral Block  Patient monitoring: NIBP, EKG, Pulse oximetry and Nasal cannula O2  Premedication: Yes and Meaningful contact maintained  Local infiltration: Lidocaine 1%    Needle  Needle type: Stim needle   Needle gauge: 20 G  Needle length: 10 cm    Procedures: ultrasound guided  Ultrasound Guided: LA spread visualized, Image stored or printed, Needle visualized and Relevant anatomy identified (nerve, vessels, muscle)      Assessment   Incremental injection: yes  Injection made incrementally with aspirations every 5 mL.  Injection Resistance: no  Paresthesia Pain: no    Blood Aspirated: No  no suspected intravascular injection  Block Outcome: No complications and Successful block

## 2016-11-13 NOTE — Progress Notes (Signed)
Right adductor canal PNB completed. Vital signs monitored and charted. Sterile protocol maintained. Patient tolerated procedure well. Discussed expected outcomes, (pain control,decreased opioid requirements and side effects, limb safety). Patient verbalized understanding.      Anesthesia Dr. Patricia Pesa at bedside

## 2016-11-13 NOTE — Progress Notes (Signed)
Pt received from PACU at 1415,AOx4,NVI,DCDI,C/O pain dilaudid 0.4 mg iv given pt oriented to the floor the call light at his reach.

## 2016-11-13 NOTE — Anesthesia Preprocedure Evaluation (Addendum)
Anesthesia Evaluation    AIRWAY    Mallampati: II    TM distance: >3 FB  Neck ROM: full  Mouth Opening:full   CARDIOVASCULAR    cardiovascular exam normal, regular and normal       DENTAL    no notable dental hx     PULMONARY    pulmonary exam normal and clear to auscultation     OTHER FINDINGS    Patient denies any loose or chipped teeth           Relevant Problems   (+) HTN (hypertension)               Anesthesia Plan    ASA 2     spinal               (Risks discussed including but not limited to:    - Neurological complications such as stroke, TIA  - Cardiovascular complications such as heart attack, Arrhythmias, Cardiac arrest   - Pulmonary complications such as Asthmatic attack,Pulm. Aspiration, Bronchospasm and Pneumonia  - Intra-operative awareness,  - Dental Injuries  - Sore Throat.  - Allergic reactions.  - Death.     Anesthesia explained and Questions answered.     Pt understands and wishes to proceed.  )      intravenous induction   Detailed anesthesia plan: general LMA        Post op pain management: per surgeon    informed consent obtained    Plan discussed with CRNA.    ECG reviewed  pertinent labs reviewed               Signed by: Ryoma Nofziger J Romey Mathieson 11/13/16 7:00 AM

## 2016-11-13 NOTE — Brief Op Note (Signed)
BRIEF OP NOTE    Date Time: 11/13/16 9:43 AM    Patient Name:   [name]    Date of Operation:   11/13/2016    Providers Performing:   Surgeon(s):  Estevan Oaks, MD  Carolyn Stare, PA    Assistant (s):   Circulator: Romana Juniper, RN  Scrub Person: Bosie Clos    Operative Procedure:   Procedure(s):  ARTHROPLASTY, KNEE, TOTAL    Preoperative Diagnosis:   Pre-Op Diagnosis Codes:     * Primary osteoarthritis of right knee [M17.11]    Postoperative Diagnosis:   Post-Op Diagnosis Codes:     * Primary osteoarthritis of right knee [M17.11]    Anesthesia:   General    Estimated Blood Loss:    * No values recorded between 11/13/2016  7:31 AM and 11/13/2016  9:43 AM *    Implants:     Implant Name Type Inv. Item Serial No. Manufacturer Lot No. LRB No. Used Action   CEMENT BONE SNGL PALACOS 1X40 - [id number] Cement CEMENT BONE SNGL PALACOS 1X40  [employer] [id number] Right 1 Implanted   COMPONENT VANGRD CR FEMRL - [id number] Component COMPONENT VANGRD CR FEMRL  BIOMET [id number] Right 1 Implanted   PATELLAR SINGLE PEG ARCOM - [id number] Patella PATELLAR SINGLE PEG ARCOM  BIOMET [id number] Right 1 Implanted   PLATE INTRLK LCKG BAR - [id number] Plate PLATE INTRLK LCKG BAR  BIOMET [id number] Right 1 Implanted   LOCKING BAR TIB - [id number] Bar LOCKING BAR TIB  BIOMET [id number] Right 1 Implanted   BEARING E1 VANGRD TIB 14X83MM - [id number] Bearing BEARING E1 VANGRD TIB 14X83MM   BIOMET [id number] Right 1 Implanted       Drains:   Drains: no    Specimens:   * No specimens in log *      Findings:   above    Complications:   none      Signed by: Estevan Oaks, MD                                                                            [facility]

## 2016-11-13 NOTE — Plan of Care (Signed)
Problem: Knee Surgery  Goal: Mobility/activity is maintained at optimum level for patient  Outcome: Progressing   11/13/16 1847   Goal/Interventions addressed this shift   Mobility/activity is maintained at optimum level for patient PT and/or OT evaluation and treatment if ordered;Teach/review/reinforce exercises (ankle pumps, quad sets, gluteal sets)

## 2016-11-13 NOTE — Op Note (Signed)
Preoperative diagnosis:    Degenerative arthritis, right knee.    Postoperative diagnosis: Same.    Primary surgeon Dr. Rhoderick Moody    1st assistant: Fredderick Severance    Note: Prior to procedure, risks, benefits, alternatives have been discussed extensively with this patient.  He understood and elected to proceed.  All risks were discussed, not only in the holding area, but also in the office.    Salome Arnt assisted through all aspects of the procedure help was Apley mandatory and greatly appreciated in all phases the procedure outlined below.    Description of procedure:    The patient was placed supine the operating room table.  After adequate spinal anesthesia.  A block.  It also been done in the adductor canal to help minimize postoperative pain.  Details can be found and anesthesia's records.    Patient was prepped and draped using ChloraPrep.  The leg was elevated, exsanguinated by gravity and a mid thigh blood pressure cuff elevated to a pressure of 350 mmHg.    A straight anterior incision was made over the knee.  Dissection was carried down to the capsule.  A median parapatellar incision was made and dissected down into the knee.    Tremendous synovitis, end-stage arthritis, and a large effusion were identified.    Soft tissues were debrided.  Ultimately, anterior and posterior cruciates were sacrificed.  Intramedullary technique was utilized to osteotomize the femur and the tibia.  The femur was osteotomized to accommodate a 75 mm porous-coated femur.  The tibia was sized at an 83 tibia.  A finned fixed plate tibia was utilized with cement.  The patella was free handed and a large button cemented in place as well.    The final assembly was put together after all trials were fit and tried.    Tourniquet was released and bleeding controlled.  After excess cement was removed and cement was allowed to cure.    A trial of 12 anterior stabilized poly-looked quite tight and looked appropriate.  Once the final  poly-was placed.  It appeared that the posterior capsule.  It started somewhat and this was removed and a 14 mm poly-was necessary to fill the gap.    This was locked in position with its normal locking device.  Prior to release of tourniquet 20 mL of cocktail were injected medially and 20 laterally to help minimize postoperative pain and bleeding.    Once the tourniquet was released.  All bleeders were controlled with electrocautery and no excessive bleeding was noted.  Once all trials had been removed and the final assembly assembled range of motion looked good.  Flexion, full extension, stable varus valgus stress and good rotation were all noted.  Patella tracked appropriately.    The capsule was closed with figure-of-eight #1 Ethibond sutures and running #1 Ethibond suture.  The subcutaneous area closed with a mixture of #1 Vicryl inverted suture and 2-0 Vicryl suture and a running 3-0 nylon used to close skin.  Dry dressing was applied.  Patient returned recovery room to be admitted for the evening for observation.    No complications were encountered.    Ms Fredderick Severance assisted through all aspects the procedure.  She help told instrumentation.  She help hold retractors.  She help with closure without her help would have been impossible to achieve a successful outcome.  Her help was Apley mandatory and greatly appreciated.
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
## 2016-11-13 NOTE — Progress Notes (Signed)
Pt C/O pain 10/10 [place] Dec the PA contacted incrised the dilaudid dose given 1mg  iv at 1600 and reputed after 2 hr and given oral dilaudid 2mg  and paged oncol DR witting for call BACk.

## 2016-11-13 NOTE — PACU (Signed)
Patient transported to floor accompanied by RN on cont. Pulse ox.

## 2016-11-13 NOTE — Transfer of Care (Signed)
Anesthesia Transfer of Care Note    Patient: [name]    Procedures performed: Procedure(s) with comments:  ARTHROPLASTY, KNEE, TOTAL - RIGHT TOTAL KNEE REPLACEMENT    Anesthesia type: General TIVA and Spinal    Patient location:PACU    Last vitals:   Vitals:    11/13/16 1009   BP: 124/56   Pulse: 74   Resp: 18   Temp: 36.4 C (97.5 F)   SpO2: 100%       Post pain: Patient not complaining of pain, continue current therapy      Mental Status:awake    Respiratory Function: tolerating face mask    Cardiovascular: stable    Nausea/Vomiting: patient not complaining of nausea or vomiting    Hydration Status: adequate    Post assessment: no apparent anesthetic complications and no reportable events    Signed by: Delana Meyer  11/13/16 10:11 AM

## 2016-11-14 ENCOUNTER — Encounter: Payer: Self-pay | Admitting: Orthopaedic Surgery

## 2016-11-14 ENCOUNTER — Other Ambulatory Visit: Payer: Self-pay

## 2016-11-14 LAB — CBC
Absolute NRBC: 0 10*3/uL
Hematocrit: 37.1 % — ABNORMAL LOW (ref 42.0–52.0)
Hgb: 12.2 g/dL — ABNORMAL LOW (ref 13.0–17.0)
MCH: 31.3 pg (ref 28.0–32.0)
MCHC: 32.9 g/dL (ref 32.0–36.0)
MCV: 95.1 fL (ref 80.0–100.0)
MPV: 10.4 fL (ref 9.4–12.3)
Nucleated RBC: 0 /100 WBC (ref 0.0–1.0)
Platelets: 292 10*3/uL (ref 140–400)
RBC: 3.9 10*6/uL — ABNORMAL LOW (ref 4.70–6.00)
RDW: 17 % — ABNORMAL HIGH (ref 12–15)
WBC: 14.46 10*3/uL — ABNORMAL HIGH (ref 3.50–10.80)

## 2016-11-14 LAB — BASIC METABOLIC PANEL
BUN: 16 mg/dL (ref 9.0–28.0)
CO2: 23 mEq/L (ref 21–29)
Calcium: 8.1 mg/dL — ABNORMAL LOW (ref 8.5–10.5)
Chloride: 105 mEq/L (ref 100–111)
Creatinine: 1.1 mg/dL (ref 0.5–1.5)
Glucose: 103 mg/dL — ABNORMAL HIGH (ref 70–100)
Potassium: 4 mEq/L (ref 3.5–5.1)
Sodium: 136 mEq/L (ref 136–145)

## 2016-11-14 LAB — GFR: EGFR: >60

## 2016-11-14 LAB — HEMOLYSIS INDEX: Hemolysis Index: 11 (ref 0–18)

## 2016-11-14 MED ORDER — HYDROMORPHONE HCL 4 MG PO TABS
4.0000 mg | ORAL_TABLET | ORAL | 0 refills | Status: DC | PRN
Start: 2016-11-14 — End: 2016-11-24
  Filled 2016-11-14: qty 60, 4d supply, fill #0

## 2016-11-14 MED ORDER — ASPIRIN 325 MG PO TBEC
325.0000 mg | DELAYED_RELEASE_TABLET | Freq: Two times a day (BID) | ORAL | 0 refills | Status: AC
Start: 2016-11-14 — End: 2016-11-28

## 2016-11-14 MED ORDER — HYDROMORPHONE HCL 4 MG PO TABS
4.0000 mg | ORAL_TABLET | ORAL | Status: DC | PRN
Start: 2016-11-14 — End: 2016-11-14

## 2016-11-14 MED ORDER — HYDROMORPHONE HCL 4 MG PO TABS
8.0000 mg | ORAL_TABLET | ORAL | Status: DC | PRN
Start: 2016-11-14 — End: 2016-11-14
  Administered 2016-11-14: 8 mg via ORAL
  Filled 2016-11-14: qty 2

## 2016-11-14 MED ORDER — ALPRAZOLAM 0.5 MG PO TABS
1.0000 mg | ORAL_TABLET | Freq: Every day | ORAL | Status: DC | PRN
Start: 2016-11-14 — End: 2016-11-14
  Administered 2016-11-14: 10:00:00 1 mg via ORAL
  Filled 2016-11-14: qty 2

## 2016-11-14 NOTE — UM Notes (Signed)
11/14/16 1021  Admit to Inpatient        UTILIZATION REVIEW CONTACT: Name: Josie Jkai Arwood  Clinical Case Manager  - Utilization Review  [facility]  Address:  [address] [place], [place]  [address]  NPI:   [id number]  Tax ID:  [id number]  Phone: [phone]  Fax: [phone]    Please use fax number [phone] to provide authorization for hospital services or to request additional information.        PATIENT NAME: [name],[name]   DOB: [birth year]   PMH:  has a past medical history of At risk for sleep apnea (2018); Disorder of prostate; History of MRSA infection (2015); Hypertension (2008); Malignant tumor of prostate (06/2016); and MRSA (methicillin resistant staph aureus) culture positive (11/08/2016).  PSH:  has a past surgical history that includes Splenectomy (2012); Spinal fusion (1990); DEBRIDEMENT & IRRIGATION, UPPER EXTREMITY (06/04/2013); BIOPSY, PROSTATE, TRANSRECTAL ULTRASOUND, (TRUS) (N/A, 12/09/2014); Knee arthroscopy (Right, 2017); BIOPSY, PROSTATE, TRANSRECTAL ULTRASOUND, (TRUS) (N/A, 05/02/2016); Colonoscopy (2017); ROBOTIC, PROSTATECTOMY W/ PELVIC LYMPH NODE DISSECTION (Bilateral, 07/17/2016); PROSTATECTOMY, RADICAL (Bilateral, 07/17/2016); and LAPAROSCOPIC, ENTEROLYSIS (N/A, 07/17/2016).     Admission date: 11/13/2016  Pt is a 56 y.o. male who arrived to the hospital for elective surgery.    Admission diagnosis: Primary osteoarthritis of right knee [M17.11]  DJD (degenerative joint disease) of knee [M17.10]    Surgical Procedure(s) completed: Procedure(s):  ARTHROPLASTY, KNEE, TOTAL    POD1; 11/14/16:     VS: 99.0, 94, 20, 134/67, 94% O2 2L NC    Abnormal Labs: WBC 14.46, H/H 12.2/37.1, GLUCOSE 103, CALCIUM 8.1    Medications:   0.9% NaCl infusion New Bag   100 mL/hr, IV, Continuous    10/01 1636     Assessment/Plan:  Ax4, family at the bedside, lungs clear. Call bell & table within reach, adequate lighting, bed alarm & floor mat, and frequently rounded. Pt has mild to severe  pain to the R knee, informed MD changed Dilaudid 4 mg PO q 3 hrs, xanax 1 mg PO, percocet 2 tabs and Dilaudid 1 mg IV given per pt/family request, pt stated that pain level decreased with pain med but present, will continue to monitor pain.  pt removed the dsg and knee immobilizer to the R knee, placed dsg and knee immobilizer back, ice pack placed. Encouraged the use of IS and exercises. TED/SCDs on. Pt refused CPM machine.     PT/OT working with patient while in house, recs patient to go home with supervision once medically stable for [place].     Randa Lynn, RN, BSN  UR Case Manager   [facility]  T [phone]

## 2016-11-14 NOTE — Progress Notes (Signed)
Discharged instructions given verbalized of understanding wife at the bedside all belongings with patients.

## 2016-11-14 NOTE — Progress Notes (Addendum)
Pt was sleeping most of the night after getting 2 tabs of Percocet and xanax 1 mg IV. Per pt request to be awaken for PO pain med Dilaudid 4 mg PO given at 0111, pt has been getting Dilaudid 1 mg IV, Percocet 2 tabs and Dilaudid 4 mg PO. Pt's O2 sat goes down to 87 to 90 %, placed pt on 2LNC. Pt c/o pain, shouting and cursing, stated that he wants to leave now after talking to Dr. Maurice March. Called Dr. Maurice March but did not answer the phone. Called answering services and waiting for a call back.
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
## 2016-11-14 NOTE — Progress Notes (Signed)
Dilaudid iv 1 mg given ,CPM started at 0-30  Pt stetted his pain 7 will continue monitoring his pain.

## 2016-11-14 NOTE — Plan of Care (Signed)
Problem: Knee Surgery  Goal: Hemodynamic Stability  Outcome: Progressing   11/14/16 0350   Goal/Interventions addressed this shift   Hemodynamic stability  Monitor/assess vital signs;Maintain temperature within desired parameters;Monitor/assess lab values and report abnormal values     Goal: Pain at adequate level as identified by patient  Outcome: Progressing   11/14/16 0350   Goal/Interventions addressed this shift   Pain at adequate level as identified by patient  Identify patient comfort function goal;Evaluate if patient comfort function goal is met;Administer analgesics as prescribed to achieve pain goal     Goal: Stable Neurovascular Status  Outcome: Progressing   11/14/16 0350   Goal/Interventions addressed this shift   Stable neurovascular status  Assess and document plantar/dorsiflexion every 4 hours;Monitor/assess neurovascular status (pulses, capillary refill, pain, paresthesia, presence of edema);Monitor/assess for return of sensation after nerve block therapy if indicated;VTE prevention: administer anticoagulant(s) and/or apply anti-embolism stockings/devices as ordered     Goal: Free from Infection  Outcome: Progressing   11/14/16 0350   Goal/Interventions addressed this shift   Free from infection Monitor/assess vital signs;Maintain temperature within desired parameters;Assess for signs and symptoms of infection;Assess surgical dressing, reinforce or change as needed per order;Teach/reinforce use of incentive spirometer 10 times per hour while awake, cough and deep breath as needed     Goal: Mobility/activity is maintained at optimum level for patient  Outcome: Progressing   11/14/16 0350   Goal/Interventions addressed this shift   Mobility/activity is maintained at optimum level for patient Administer analgesics as prescribed to achieve pain goal;Teach/review/reinforce exercises (ankle pumps, quad sets, gluteal sets);Teach/review/reinforce knee precautions with patient/patient care companion (no  pillow under knee, lock out knee flexion feature on bed);Dangle/stand at bedside if indicated with assistance as needed     Goal: Patient will maintain normal GI status  Outcome: Progressing   11/14/16 0350   Goal/Interventions addressed this shift   Patient will maintain normal GI status  Assess for nausea and/or vomiting. Provide pharmacological and/or non-pharmacological support as needed.;Assess for normal bowel sounds       Problem: Safety  Goal: Patient will be free from injury during hospitalization  Outcome: Progressing   11/14/16 0350   Goal/Interventions addressed this shift   Patient will be free from injury during hospitalization  Provide and maintain safe environment;Use appropriate transfer methods;Assess patient's risk for falls and implement fall prevention plan of care per policy;Ensure appropriate safety devices are available at the bedside;Include patient/ family/ care giver in decisions related to safety;Hourly rounding       Comments: Ax4, family at the bedside, lungs clear. Call bell & table within reach, adequate lighting, bed alarm & floor mat, and frequently rounded. Pt has mild to severe pain to the R knee, informed MD changed Dilaudid 4 mg PO q 3 hrs, xanax 1 mg PO, percocet 2 tabs and Dilaudid 1 mg IV given per pt/family request, pt stated that pain level decreased with pain med but present, will continue to monitor pain.  pt removed the dsg and knee immobilizer to the R knee, placed dsg and knee immobilizer back, ice pack placed. Encouraged the use of IS and exercises. TED/SCDs on. Pt refused CPM machine. Plan of care, continue to monitor pain and NV status.

## 2016-11-14 NOTE — Progress Notes (Signed)
[facility] [phone]  CPM order faxed directly to [facility].

## 2016-11-14 NOTE — Plan of Care (Signed)
Problem: Knee Surgery  Goal: Hemodynamic Stability  Outcome: Completed Date Met: 11/14/16    Goal: Pain at adequate level as identified by patient  Outcome: Completed Date Met: 11/14/16  Pain manageable with po and iv pain medicine.  Goal: Stable Neurovascular Status  Outcome: Completed Date Met: 11/14/16    Goal: Free from Infection  Outcome: Completed Date Met: 11/14/16    Goal: Mobility/activity is maintained at optimum level for patient  Outcome: Completed Date Met: 11/14/16    Goal: Patient will maintain normal GI status  Outcome: Completed Date Met: 11/14/16      Problem: Safety  Goal: Patient will be free from injury during hospitalization  Outcome: Completed Date Met: 11/14/16      Problem: Psychosocial and Spiritual Needs  Goal: Demonstrates ability to cope with hospitalization/illness  Outcome: Completed Date Met: 11/14/16

## 2016-11-14 NOTE — Progress Notes (Signed)
I spoke with Dr. Rayvon Char, informed him that pt is c/o pain, MD said to continue with the current medication, no new orders.

## 2016-11-14 NOTE — Progress Notes (Signed)
Post OP Day 1 Day Post-Op for Procedure(s):  ARTHROPLASTY, KNEE, TOTAL     Patient had severe pain overnight that was unrelieved with IV Dilaudid, 2 tablets of PO Percocet and PO Dilaudid 2 mg. Dr. Maurice March was contacted and medication dosages were increased. He is also spoke with the patient personally to put him at ease. Throughout the evening, the patient continued to complain of pain and threatened to leave the hospital AMA. This morning, patient's pain is much better controlled with IV Dilaudid PRN and PO Dilaudid 4-8 mg every 3 hours. Tolerating PO without N/V. Voiding adequately. Tolerating CPM and ambulating successfully with PT including stair management.     Patient is very eager to go home today.     Vitals:    11/14/16 1118   BP: 127/64   Pulse: 98   Resp: 18   Temp: 99.9 F (37.7 C)   SpO2: 96%       Intake/Output Summary (Last 24 hours) at 11/14/16 1306  Last data filed at 11/14/16 0600   Gross per 24 hour   Intake             1528 ml   Output              200 ml   Net             1328 ml       Exam:    Awake and alert.  Oriented X 3    Dressing clean, dry and intact. Dressing changed today. No active drainage or evidence of infection.   Calves soft, non tender.  Negative Homans.      Recent Labs  Lab 11/14/16  0439   WBC 14.46*   Hgb 12.2*   Hematocrit 37.1*         Recent Labs  Lab 11/14/16  0439   Sodium 136   Potassium 4.0   Chloride 105   CO2 23   BUN 16.0   Creatinine 1.1   Glucose 103*   Calcium 8.1*           Assessment:    Satisfactory post-op course status post Procedure(s):  ARTHROPLASTY, KNEE, TOTAL     Plan:    Continue per clinical pathway for Procedure(s):  ARTHROPLASTY, KNEE, TOTAL     1. Patient stable post op day #1.  2. Continue to monitor pain level prior to discharge.   3. RX for Dilaudid 4mg  provided for post-op pain. Long discussion held with the patient and his wife regarding acute post-operative pain. Patient is aware of the risks and side effects of such potent narcotic  medication.  4. Dressing changed today and demonstrated for patient's wife.  5. Case management spoke with patient and his wife. HHPT set-up x 2 weeks. Patient may need a walker but also states his friends have some. Case manager's info will be provided in case.   6. All post-op and follow-up instructions discussed with the patient and his wife in detail. He is safe and medically cleared to be discharged to home today.

## 2016-11-14 NOTE — PT Progress Note (Signed)
Physical Therapy Note    [facility]   Physical Therapy Treatment  Patient:  [name] MRN#:  [id number]  Unit: [facility] TOWER 8  Bed: F845/F845.01    Discharge Recommendations:   D/C Recommendations: Home with home health PT     DME Recommendations: rw    Assessment:   Treatment Summary/Assessment:   Pt met goals and is safe for [facility] home with wife assisting with TE and HHPT.  See below for details of session.       Treatment Activities: TE: ap, gs, qs, heel slide, SAQ, SLR, gt training    Educated the patient to role of physical therapy, plan of care, goals of therapy and HEP, safety with mobility and ADLs, home safety.    Plan:       PT Frequency:  ([facility])     Discharge from PT Acute Care Services.       Precautions and Contraindications:   Full weight bearing RLE  Falls      Subjective:   Pt reported he will be leaving today    Patient is agreeable to participation in the therapy session. Family and/or guardian are agreeable to patient's participation in the therapy session. Nursing clears patient for therapy.    Patient's medical condition is appropriate for Physical Therapy intervention at this time.      Cognition: Patient is alert and oriented x 3 and follows directions without difficulty.     Objective:   Observation of Patient/Vital Signs:  Patient is in bed with ace wrap R knee, IV access in place.    Wife present    Postural Assessment:   Unremarkable     Pain:   Scale: 8/10  Location: R knee  Intervention: pain medication, mobility, reposition     Functional Mobility       Rolling: nt       Supine to Sit: mod ind using LLE to move RLE       Sit to Supine: mod ind using LLE to move RLE       Scooting: ind at EOB       Sit to Stand: ind       Stand to Sit: ind        Transfers: ind with rw    PMP - Progressive Mobility Protocol   PMP Activity: Step 7 - Walks out of Room  Distance Walked (ft) (Step 6,7): 150 Feet     Ambulation:       WB Status: full RLE       Assistive  Device: rw       Assistance Level: none       Distance: 150' (including stairs)       Gait Pattern: antalgic RLE       Stair Management: recommend supervision for [facility] and railing     Balance:   Good with rw    Endurance:  good    Patient Participation: good    Active ROM R knee:  15 to 40 (CPM set on 30)    Pt left supine with needs met with RN informed of status as well as outcome of PT session.    Patient left with call bell and phone within reach, SCD, fall mat . All needs met and all questions answered.    Goals:  Goals  Goal Formulation: With patient/family  Time for Goal Acheivement: 2 visits  Goals: Select goal  Pt Will Go Supine To Sit:  independent  Pt Will Perform Sit To Supine: independent  Pt Will Transfer Bed/Chair: with rolling walker, modified independent  Pt Will Ambulate: 101-150 feet, with rolling walker, modified independent  Pt Will Go Up / Down Stairs: 1 flight, modified independent, With rail, With [facility]    Luther Bradley, PT  Pager Number [phone]    Time of Treatment  PT Received On: 11/14/16  Start Time: 1042  Stop Time: 1129  Time Calculation (min): 47 min    Treatment # 1 out of 2 visits

## 2016-11-15 NOTE — Progress Notes (Signed)
[facility] unable to verify eligibility  Called 5 Home care agencies that do not accept patient insurance  Faxed information to 2 agencies Waiting for follow up  Notified patient of delay in Home Health

## 2016-11-15 NOTE — Discharge Summary (Signed)
Discharge Date: 11/14/2016     ATTENDING PHYSICIAN:  Desmond Dike, MD     HISTORY OF PRESENT ILLNESS:  The patient is a 56 year old gentleman admitted after a right total knee  arthroplasty.  He failed conservative treatment.  He was admitted  postoperatively with a multitude of medical issues.  He had severe extreme  pain and discomfort following the procedure and had the requirement for  intervention with pain management and mobilization was done with therapy.   Once stable and checked by Ms. Fredderick Severance, physician's assistant, he was  discharged to home on Dilaudid as needed for pain.  He used aspirin for  anticoagulation and ankle pumps.  He had a history of possible MRSA in the  past and was treated with vancomycin for antibiotic coverage.  No  complications were encountered, mobilized eventually with less discomfort.   He has a history of anxiety and he had to take Xanax for this.  Once he was  comfortable and less anxious, he was discharged to home to be followed in  the usual post-discharge fashion.  The remainder of historical, physical  and laboratory findings can be found in the chart.     FINAL DIAGNOSIS:  Degenerative arthritis, right knee.  He is now status post right total knee  arthroplasty at [facility]           D:  11/14/2016 13:27 PM by Dr. Blanchard Kelch, MD [phone])  T:  11/15/2016 05:52 AM by NTS      (Conf: [id number]) (Doc ID: [id number])

## 2016-11-15 NOTE — Progress Notes (Signed)
[facility]  [facility]        Patient Name: [name], [name]     MRN: [id number]     CSN: [id number]       Account Information    Hosp Acct #   [id number] Patient Class   Inpatient Service  Orthopedic Joint Replacement Accommodation Code  Semi-Private     Admission Information    Admitting Physician:  Attending Physician: Chinita Greenland III*   Unit  [facility] [address]* L&D Status     Admitting Diagnosis: Primary osteoarthritis of right knee; DJD (degenerative joint disease) of * Room / Bed  F845/F845.01 L&D - Last Menstrual Cycle     Chief Complaint:      Admit Type:  ED Admit Date/Time:  Admission Date/Time:  Discharge Date: Elective     11/13/16 0529  11/14/2016 1405 Length of Stay: 1 Days   L&D EDD   Estimated Date of Delivery: None noted.     Patient Information            Home Address: [address]  [place] [address] Employer:  Employer Address: [employer]    ,     Main Phone: [phone] Employer Phone:    SSN: [id number]     DOB: 1960/03/06 (56 yrs)     Sex: Male Primary Care Physician: Christa See, MD   Marital Status: Single Primary Care Physician Phone: None   Race: White or Caucasian Referring Physician: Chinita Greenland III*   Ethnicity: Non Hispanic/Latino Religion: Catholic   Emergency Contacts  Name Home Phone Work Phone Mobile Phone Relationship Lgl Holcombe   [phone] Spouse         Guarantor Information    Guarantor Name: AMIT, LEECE Guarantor ID: [id number]   Guarantor Relationship to Pt: Self Guarantor Type: Personal/Family   Guarantor DOB:   Dec 29, 1960     Guarantor Address: [address] [place], [place] [address]       Guarantor Home Phone: [phone] Guarantor Employer:     [employer]   Guarantor Work Phone:  Charlynne Pander Emp Phone:               Chief Technology Officer Name: [employer] Subscriber Name: [employer]   Insurance Address:   [address]  [place], [place]  [address] Subscriber DOB: 1960/09/29     Subscriber ID: [id number]   Insurance Phone:  Pt Relationship to Sub:   Self   Insurance ID:      Group Name:  Preauthorization #: [id number]   Group #: [id number] Preauthorization Days:      Secondary Insurance    Insurance Name: Scientist, clinical (histocompatibility and immunogenetics) Name:    Community education officer Address:     ,   Statistician DOB:      Subscriber ID:    Press photographer:  Pt Relationship to Sub:      Insurance ID:      Group Name:  Passenger transport manager #:    Group #:  Preauthorization Days:      [employer] Name: - Statistician Name:    Community education officer Address:     ,   Statistician DOB:      Subscriber ID:    Press photographer:  Pt Relationship to Sub:      Insurance ID:      Group Name:  Preauthorization #:    Group #:  Preauthorization Days:        11/15/2016  1:57 PM

## 2016-11-17 NOTE — Progress Notes (Signed)
[facility]    Phone [phone]  Fax [phone]    Rep. Deana  said that they delivered CPM order on 11/14/16

## 2016-11-17 NOTE — Anesthesia Postprocedure Evaluation (Signed)
Anesthesia Post Evaluation    Patient: [name]    Procedure(s) with comments:  ARTHROPLASTY, KNEE, TOTAL - RIGHT TOTAL KNEE REPLACEMENT    Anesthesia type: Spinal    Last Vitals:   Vitals:    11/14/16 1118   BP: 127/64   Pulse: 98   Resp: 18   Temp: 37.7 C (99.9 F)   SpO2: 96%       Patient Location: Phase I PACU      Post Pain: Patient not complaining of pain, continue current therapy    Mental Status: awake    Respiratory Function: tolerating face mask    Cardiovascular: stable    Nausea/Vomiting: patient not complaining of nausea or vomiting    Hydration Status: adequate    Post Assessment: no apparent anesthetic complications, no reportable events and no evidence of recall          Anesthesia Qualified Clinical Data Registry 2018    PACU Reintubation  Did the Patient have general anesthesia with intubation: No        PONV Adult  Is the patient aged 25 or older: Yes  Did the patient receive recieve a general anesthestic: No          PONV Pediatric  Is the patient aged 44-17? No            PACU Transfer Checklist Protocol  Was the patient transferred to the PACU at the conclusion of surgery? Yes  Was a checklist or transfer protocol used? Yes    ICU Transfer Checklist Protocol  Was the patient transferred to the ICU at the conclusion of surgery? No      Post-op Pain Assessment Prior to Anesthesia Care End  Age >=18 and assessed for pain in PACU: Yes  Pacu pain score <7/10: Yes      Perioperative Mortality  Perioperative mortality prior to Anesthesia end time: No    Perioperative Cardiac Arrest  Did the patient have an unanticipated intraoperative cardiac arrest between anesthesia start time and anesthesia end time? No    Unplanned Admission to ICU  Did the patient have an unplanned admission to the ICU (not initially anticipated at anesthesia start time)? No      Signed by: Hessie Diener, 11/17/2016 11:06 PM

## 2016-11-18 ENCOUNTER — Emergency Department: Payer: No Typology Code available for payment source

## 2016-11-18 ENCOUNTER — Inpatient Hospital Stay
Admission: EM | Admit: 2016-11-18 | Discharge: 2016-11-27 | DRG: 908 | Disposition: A | Discharge status: Home Health | Payer: No Typology Code available for payment source | Attending: Orthopaedic Surgery | Admitting: Orthopaedic Surgery

## 2016-11-18 DIAGNOSIS — Z22322 Carrier or suspected carrier of Methicillin resistant Staphylococcus aureus: Secondary | ICD-10-CM

## 2016-11-18 DIAGNOSIS — Z96651 Presence of right artificial knee joint: Secondary | ICD-10-CM | POA: Diagnosis present

## 2016-11-18 DIAGNOSIS — L03115 Cellulitis of right lower limb: Secondary | ICD-10-CM | POA: Diagnosis present

## 2016-11-18 DIAGNOSIS — M96840 Postprocedural hematoma of a musculoskeletal structure following a musculoskeletal system procedure: Principal | ICD-10-CM | POA: Diagnosis present

## 2016-11-18 DIAGNOSIS — L0390 Cellulitis, unspecified: Secondary | ICD-10-CM

## 2016-11-18 DIAGNOSIS — I10 Essential (primary) hypertension: Secondary | ICD-10-CM | POA: Diagnosis present

## 2016-11-18 DIAGNOSIS — Z981 Arthrodesis status: Secondary | ICD-10-CM

## 2016-11-18 DIAGNOSIS — Z8614 Personal history of Methicillin resistant Staphylococcus aureus infection: Secondary | ICD-10-CM

## 2016-11-18 DIAGNOSIS — L03818 Cellulitis of other sites: Secondary | ICD-10-CM

## 2016-11-18 DIAGNOSIS — Z79899 Other long term (current) drug therapy: Secondary | ICD-10-CM

## 2016-11-18 DIAGNOSIS — Z8546 Personal history of malignant neoplasm of prostate: Secondary | ICD-10-CM

## 2016-11-18 DIAGNOSIS — Z9081 Acquired absence of spleen: Secondary | ICD-10-CM

## 2016-11-18 DIAGNOSIS — Z9079 Acquired absence of other genital organ(s): Secondary | ICD-10-CM

## 2016-11-18 DIAGNOSIS — D72829 Elevated white blood cell count, unspecified: Secondary | ICD-10-CM | POA: Diagnosis present

## 2016-11-18 LAB — I-STAT CG4 VENOUS CARTRIDGE
Lactic Acid I-Stat: 0.9 mmol/L (ref 0.2–2.0)
i-STAT Base Excess Venous: 4 mEq/L
i-STAT FIO2: 21
i-STAT HCO3 Bicarbonate Venous: 29.9 mEq/L
i-STAT O2 Saturation Venous: 62 %
i-STAT Patient Temperature: 99.2
i-STAT Total CO2 Venous: 31 mEq/L
i-STAT pCO2 Venous: 46.9
i-STAT pH Venous: 7.413
i-STAT pO2 Venous: 33

## 2016-11-18 LAB — CBC AND DIFFERENTIAL
Absolute NRBC: 0 10*3/uL
Basophils Absolute Automated: 0.04 10*3/uL (ref 0.00–0.20)
Basophils Automated: 0.3 %
Eosinophils Absolute Automated: 0.32 10*3/uL (ref 0.00–0.70)
Eosinophils Automated: 2.3 %
Hematocrit: 34.3 % — ABNORMAL LOW (ref 42.0–52.0)
Hgb: 11.6 g/dL — ABNORMAL LOW (ref 13.0–17.0)
Immature Granulocytes Absolute: 0.13 10*3/uL — ABNORMAL HIGH
Immature Granulocytes: 0.9 %
Lymphocytes Absolute Automated: 2.12 10*3/uL (ref 0.50–4.40)
Lymphocytes Automated: 15 %
MCH: 32 pg (ref 28.0–32.0)
MCHC: 33.8 g/dL (ref 32.0–36.0)
MCV: 94.8 fL (ref 80.0–100.0)
MPV: 10.1 fL (ref 9.4–12.3)
Monocytes Absolute Automated: 1.84 10*3/uL — ABNORMAL HIGH (ref 0.00–1.20)
Monocytes: 13 %
Neutrophils Absolute: 9.73 10*3/uL — ABNORMAL HIGH (ref 1.80–8.10)
Neutrophils: 68.5 %
Nucleated RBC: 0 /100 WBC (ref 0.0–1.0)
Platelets: 376 10*3/uL (ref 140–400)
RBC: 3.62 10*6/uL — ABNORMAL LOW (ref 4.70–6.00)
RDW: 17 % — ABNORMAL HIGH (ref 12–15)
WBC: 14.18 10*3/uL — ABNORMAL HIGH (ref 3.50–10.80)

## 2016-11-18 LAB — COMPREHENSIVE METABOLIC PANEL
ALT: 22 U/L (ref 0–55)
AST (SGOT): 17 U/L (ref 5–34)
Albumin/Globulin Ratio: 0.9 (ref 0.9–2.2)
Albumin: 3.1 g/dL — ABNORMAL LOW (ref 3.5–5.0)
Alkaline Phosphatase: 51 U/L (ref 38–106)
BUN: 18 mg/dL (ref 9.0–28.0)
Bilirubin, Total: 0.8 mg/dL (ref 0.2–1.2)
CO2: 28 mEq/L (ref 22–29)
Calcium: 8.8 mg/dL (ref 8.5–10.5)
Chloride: 98 mEq/L — ABNORMAL LOW (ref 100–111)
Creatinine: 1.1 mg/dL (ref 0.7–1.3)
Globulin: 3.5 g/dL (ref 2.0–3.6)
Glucose: 99 mg/dL (ref 70–100)
Potassium: 4 mEq/L (ref 3.5–5.1)
Protein, Total: 6.6 g/dL (ref 6.0–8.3)
Sodium: 135 mEq/L — ABNORMAL LOW (ref 136–145)

## 2016-11-18 LAB — GFR: EGFR: >60

## 2016-11-18 LAB — URINALYSIS, REFLEX TO MICROSCOPIC EXAM IF INDICATED
Bilirubin, UA: NEGATIVE
Blood, UA: NEGATIVE
Glucose, UA: NEGATIVE
Ketones UA: NEGATIVE
Leukocyte Esterase, UA: NEGATIVE
Nitrite, UA: NEGATIVE
Protein, UR: 100 — AB
Specific Gravity UA: 1.026 (ref 1.001–1.035)
Urine pH: 6 (ref 5.0–8.0)
Urobilinogen, UA: NORMAL mg/dL

## 2016-11-18 MED ORDER — HYDROMORPHONE HCL 1 MG/ML IJ SOLN
2.0000 mg | Freq: Once | INTRAMUSCULAR | Status: AC
Start: 2016-11-18 — End: 2016-11-18
  Administered 2016-11-18: 12:00:00 2 mg via INTRAVENOUS
  Filled 2016-11-18: qty 2

## 2016-11-18 MED ORDER — OXYCODONE-ACETAMINOPHEN 5-325 MG PO TABS
1.0000 | ORAL_TABLET | ORAL | Status: DC | PRN
Start: 2016-11-18 — End: 2016-11-20
  Filled 2016-11-18: qty 1

## 2016-11-18 MED ORDER — HYDROMORPHONE HCL 1 MG/ML IJ SOLN
3.0000 mg | Freq: Once | INTRAMUSCULAR | Status: AC
Start: 2016-11-18 — End: 2016-11-18
  Administered 2016-11-18: 14:00:00 3 mg via INTRAVENOUS
  Filled 2016-11-18: qty 3

## 2016-11-18 MED ORDER — VANCOMYCIN HCL 1000 MG IV SOLR
2000.0000 mg | Freq: Once | INTRAVENOUS | Status: AC
Start: 2016-11-18 — End: 2016-11-18
  Administered 2016-11-18: 12:00:00 2000 mg via INTRAVENOUS
  Filled 2016-11-18: qty 2000

## 2016-11-18 MED ORDER — VANCOMYCIN HCL 1000 MG IV SOLR
1750.0000 mg | Freq: Two times a day (BID) | INTRAVENOUS | Status: DC
Start: 2016-11-18 — End: 2016-11-18

## 2016-11-18 MED ORDER — NALOXONE HCL 0.4 MG/ML IJ SOLN (WRAP)
0.2000 mg | INTRAMUSCULAR | Status: DC | PRN
Start: 2016-11-18 — End: 2016-11-27

## 2016-11-18 MED ORDER — MORPHINE SULFATE ER 15 MG PO TBCR
15.0000 mg | EXTENDED_RELEASE_TABLET | Freq: Two times a day (BID) | ORAL | Status: DC
Start: 2016-11-18 — End: 2016-11-22
  Administered 2016-11-18 – 2016-11-22 (×8): 15 mg via ORAL
  Filled 2016-11-18 (×8): qty 1

## 2016-11-18 MED ORDER — HYDROMORPHONE HCL 1 MG/ML IJ SOLN
3.0000 mg | INTRAMUSCULAR | Status: DC | PRN
Start: 2016-11-18 — End: 2016-11-18
  Administered 2016-11-18 (×3): 3 mg via INTRAVENOUS
  Filled 2016-11-18 (×2): qty 3

## 2016-11-18 MED ORDER — SODIUM CHLORIDE 0.9 % IV MBP
4.5000 g | Freq: Four times a day (QID) | INTRAVENOUS | Status: DC
Start: 2016-11-18 — End: 2016-11-24
  Administered 2016-11-18 – 2016-11-24 (×23): 4.5 g via INTRAVENOUS
  Filled 2016-11-18: qty 100
  Filled 2016-11-18 (×2): qty 20
  Filled 2016-11-18: qty 100
  Filled 2016-11-18: qty 20
  Filled 2016-11-18: qty 100
  Filled 2016-11-18: qty 20
  Filled 2016-11-18 (×5): qty 100
  Filled 2016-11-18 (×2): qty 20
  Filled 2016-11-18 (×5): qty 100
  Filled 2016-11-18: qty 20
  Filled 2016-11-18 (×3): qty 100
  Filled 2016-11-18 (×2): qty 20
  Filled 2016-11-18: qty 100
  Filled 2016-11-18 (×2): qty 20
  Filled 2016-11-18: qty 100
  Filled 2016-11-18 (×2): qty 20
  Filled 2016-11-18: qty 100
  Filled 2016-11-18: qty 20
  Filled 2016-11-18 (×2): qty 100
  Filled 2016-11-18 (×3): qty 20
  Filled 2016-11-18: qty 100
  Filled 2016-11-18 (×7): qty 20

## 2016-11-18 MED ORDER — HYDROMORPHONE HCL 1 MG/ML IJ SOLN
4.0000 mg | INTRAMUSCULAR | Status: DC | PRN
Start: 2016-11-18 — End: 2016-11-20
  Administered 2016-11-18 – 2016-11-20 (×15): 4 mg via INTRAVENOUS
  Filled 2016-11-18 (×2): qty 4
  Filled 2016-11-18: qty 2
  Filled 2016-11-18 (×6): qty 4
  Filled 2016-11-18: qty 2
  Filled 2016-11-18: qty 1
  Filled 2016-11-18 (×3): qty 4
  Filled 2016-11-18: qty 3
  Filled 2016-11-18 (×2): qty 4

## 2016-11-18 MED ORDER — ENOXAPARIN SODIUM 40 MG/0.4ML SC SOLN
40.0000 mg | Freq: Every day | SUBCUTANEOUS | Status: DC
Start: 2016-11-18 — End: 2016-11-21
  Administered 2016-11-18 – 2016-11-20 (×3): 40 mg via SUBCUTANEOUS
  Filled 2016-11-18 (×3): qty 0.4

## 2016-11-18 MED ORDER — HYDROMORPHONE HCL 0.5 MG/0.5 ML IJ SOLN
0.4000 mg | Freq: Once | INTRAMUSCULAR | Status: AC
Start: 2016-11-18 — End: 2016-11-18
  Administered 2016-11-18: 08:00:00 0.4 mg via INTRAVENOUS
  Filled 2016-11-18: qty 1

## 2016-11-18 MED ORDER — HYDROMORPHONE HCL 1 MG/ML IJ SOLN
3.0000 mg | INTRAMUSCULAR | Status: DC | PRN
Start: 2016-11-18 — End: 2016-11-18
  Filled 2016-11-18: qty 3

## 2016-11-18 MED ORDER — VANCOMYCIN HCL IN NACL 1.5-0.9 GM/500ML-% IV SOLN
1500.0000 mg | Freq: Two times a day (BID) | INTRAVENOUS | Status: DC
Start: 2016-11-19 — End: 2016-11-23
  Administered 2016-11-19 – 2016-11-23 (×10): 1500 mg via INTRAVENOUS
  Filled 2016-11-18 (×14): qty 500

## 2016-11-18 MED ORDER — HYDROMORPHONE HCL 1 MG/ML IJ SOLN
1.0000 mg | Freq: Once | INTRAMUSCULAR | Status: AC
Start: 2016-11-18 — End: 2016-11-18
  Administered 2016-11-18: 09:00:00 1 mg via INTRAVENOUS
  Filled 2016-11-18: qty 1

## 2016-11-18 NOTE — Progress Notes (Signed)
I was notified by Dr Lorin Picket that the patient was seen and admitted today. This patient has been a pain management issue since the day of his surgery and somewhat difficult to manage initially. No obvious issues were noted when he was discharged. I under stand that he is afebrile, has some redness in his leg and knee area , and has a WBC of 14, I also understand that phlebitis has been ruled out,. I appreciate all the help. I will be able to return to the area in AM Sunday and will see the patient. Dr Lorin Picket will cover until then. Please be advised that this patient is a know steroid user for lifting and this may influence the WBC. He has recent urinary issues and a previous history of MRSA infection in his leg. Thank you again for the assistance in this case and I will see him in AM Sunday.

## 2016-11-18 NOTE — ED Provider Notes (Signed)
[facility] EMERGENCY DEPARTMENT H&P                                             ATTENDING SUPERVISORY NOTE      Visit date: 11/18/2016      CLINICAL SUMMARY          Diagnosis:    .     Final diagnoses:   Cellulitis         MDM Notes:      Patient presenting with worsening pain, redness and swelling after recent total knee replacement.  Sent in by orthopedic office to rule out deep vein thrombosis and for concern for cellulitis.  Ultrasound negative for deep vein thrombosis.  Clinically, patient symptoms concerning for cellulitis due to is erythema of lower extremity with swelling and tenderness to palpation.  Mild leukocytosis noted, intractable pain.  Given empiric antibiotics, IV narcotics for pain.   Discussed with [facility] [place] on call, recommended admission to hospitalist service for further management         Disposition:         Inpatient Admit      ED Disposition     ED Disposition Condition Date/Time Comment    Admit  Sat Nov 18, 2016 12:28 PM Admitting Physician: Fritz Pickerel [[id number]]   Diagnosis: Cellulitis [604540]   Estimated Length of Stay: > or = to 2 midnights   Tentative Discharge Plan?: Home or Self Care [1]   Patient Class: Inpatient [101]           CASE ACUITY SUMMARY        Cellulitis             CLINICAL INFORMATION        HPI:      Chief Complaint: Leg Pain  .    Cornie Herrington is a 56 y.o. male who presents with moderate worsening RT knee pain for 3 days. Pt recently had a total RT knee replacement 6 days ago and for 3 days pt has noticed pain in the same knee. Pt has been treating pain at home with ice. State the pain is worsened with standing. Denies SOB, emesis, and nausea.     History obtained from: Patient      ROS:      Positive and negative ROS elements as per HPI.  All other systems reviewed and negative.      Physical Exam:      Pulse 96  BP 120/60  Resp 18  SpO2 94 %  Temp 98.3 F (36.8 C)    Physical Exam   Constitutional: He is oriented to  person, place, and time. He appears well-developed and well-nourished. No distress.   HENT:   Head: Normocephalic and atraumatic.   Eyes: EOM are normal.   Neck: Neck supple.   Cardiovascular: Normal rate, regular rhythm and normal heart sounds.    Pulmonary/Chest: Effort normal and breath sounds normal. No respiratory distress.   Abdominal: Soft. He exhibits no distension.   Musculoskeletal: Normal range of motion. He exhibits edema and tenderness.        Right knee: He exhibits swelling and erythema. Tenderness found.   Scar over RT knee is clean dry, and intact with no discharge; TTP of RLE distal to knee and RT upper thigh with diffuse erythema of of anterior calf with erythema and ttp  extending to proximal medial thigh.     Neurological: He is alert and oriented to person, place, and time.   Skin: Skin is warm and dry.   Psychiatric: He has a normal mood and affect.   Nursing note and vitals reviewed.                PAST HISTORY        Primary Care Provider: Christa See, MD        PMH/PSH:    .     Past Medical History:   Diagnosis Date   . At risk for sleep apnea 2018    stop-bang   . Disorder of prostate     Enlarged   . History of MRSA infection 2015    right shoulder wound-no longer present   . Hypertension 2008    128/78   . Malignant tumor of prostate 06/2016    no chemo//no radiation   . MRSA (methicillin resistant staph aureus) culture positive 11/08/2016     Postitive throat culture        He has a past surgical history that includes Splenectomy (2012); Spinal fusion (1990); DEBRIDEMENT & IRRIGATION, UPPER EXTREMITY (06/04/2013); BIOPSY, PROSTATE, TRANSRECTAL ULTRASOUND, (TRUS) (N/A, 12/09/2014); Knee arthroscopy (Right, 2017); BIOPSY, PROSTATE, TRANSRECTAL ULTRASOUND, (TRUS) (N/A, 05/02/2016); Colonoscopy (2017); ROBOTIC, PROSTATECTOMY W/ PELVIC LYMPH NODE DISSECTION (Bilateral, 07/17/2016); PROSTATECTOMY, RADICAL (Bilateral, 07/17/2016); LAPAROSCOPIC, ENTEROLYSIS (N/A, 07/17/2016); and ARTHROPLASTY,  KNEE, TOTAL (Right, 11/13/2016).      Social/Family History:      He reports that he has never smoked. His smokeless tobacco use includes Snuff. He reports that he drinks about 2.4 oz of alcohol per week . He reports that he does not use drugs.    History reviewed. No pertinent family history.      Listed Medications on Arrival:    .     Home Medications     Med List Status:  Pharmacy Completed Set By: Dewitt Hoes D at 11/18/2016 12:56 PM        Status Comment         11/18/2016 12:56 PM    Pharmacy ed tech verified pt's med history and last doses. No med list or  med bottles available.                acetaminophen (TYLENOL) 500 MG tablet     Take 1,000 mg by mouth as needed for Pain.     aspirin EC 325 MG EC tablet     Take 1 tablet (325 mg total) by mouth 2 (two) times daily.for 14 days     HYDROmorphone (DILAUDID) 4 MG tablet     Take 1-2 tablets (4-8 mg total) by mouth every 3 (three) hours as needed (severe pain).     valsartan-hydroCHLOROthiazide (DIOVAN-HCT) 320-25 MG per tablet     Take 1 tablet by mouth daily.                        Allergies: He has No Known Allergies.            VISIT INFORMATION        Clinical Course in the ED:            Medications Given in the ED:    .     ED Medication Orders     Start Ordered     Status Ordering Provider    11/18/16 1145 11/18/16 1108  vancomycin (VANCOCIN) 2,000 mg in  sodium chloride 0.9 % 500 mL IVPB  Once in ED     Route: Intravenous  Ordered Dose: 2,000 mg     Last MAR action:  New Bag WEST-SANTOS, CASSIDY    11/18/16 1109 11/18/16 1108  HYDROmorphone (DILAUDID) injection 2 mg  Once     Route: Intravenous  Ordered Dose: 2 mg     Last MAR action:  Given WEST-SANTOS, CASSIDY    11/18/16 0921 11/18/16 0920  HYDROmorphone (DILAUDID) injection 1 mg  Once     Route: Intravenous  Ordered Dose: 1 mg     Last MAR action:  Given Rheannon Cerney M    11/18/16 0757 11/18/16 0800  HYDROmorphone (DILAUDID) injection 0.4 mg  Once     Route: Intravenous  Ordered Dose: 0.4 mg      Last MAR action:  Given WEST-SANTOS, CASSIDY            Procedures:      Procedures      Interpretations:      O2 sat-           saturation: 94 %; Oxygen use: room air; Interpretation: borderline normal            RESULTS        Lab Results:      Results     Procedure Component Value Units Date/Time    CULTURE BLOOD AEROBIC AND ANAEROBIC [[id number]] Collected:  11/18/16 1124    Specimen:  Blood from Blood, Venipuncture Updated:  11/18/16 1124    Narrative:       The order will result in two separate 8-28ml bottles  Please do NOT order repeat blood cultures if one has been  drawn within the last 48 hours.  AVOID BLOOD CULTURE DRAWS FROM CENTRAL LINE IF POSSIBLE  Indications:->Bacteremia  1 BLUE+1 PURPLE    CULTURE BLOOD AEROBIC AND ANAEROBIC [[id number]] Collected:  11/18/16 1124    Specimen:  Blood from Blood, Venipuncture Updated:  11/18/16 1124    Narrative:       The order will result in two separate 8-84ml bottles  Please do NOT order repeat blood cultures if one has been  drawn within the last 48 hours.  AVOID BLOOD CULTURE DRAWS FROM CENTRAL LINE IF POSSIBLE  Indications:->Bacteremia  1 BLUE+1 PURPLE    i-Stat CG4 Venous CartrIDge [[id number]] Collected:  11/18/16 7846     Updated:  11/18/16 0907     i-STAT pH Venous 7.413     i-STAT pCO2 Venous 46.9     i-STAT pO2 Venous 33.0     i-STAT HCO3 Bicarbonate Venous 29.9 mEq/L      i-STAT Total CO2 Venous 31.0 mEq/L      i-STAT Base Excess Venous 4.0 mEq/L      i-STAT O2 Saturation Venous 62.0 %      i-STAT Lactic acid 0.9 mmol/L      i-STAT Patient Temperature 99.2     i-STAT FIO2 21     i-STAT Allen's Test NA     i-STAT Draw Site Venous    Comprehensive metabolic panel [[id number]]  (Abnormal) Collected:  11/18/16 0816    Specimen:  Blood Updated:  11/18/16 0852     Glucose 99 mg/dL      BUN 32.4 mg/dL      Creatinine 1.1 mg/dL      Sodium 401 (L) mEq/L      Potassium 4.0 mEq/L      Chloride 98 (L) mEq/L  CO2 28 mEq/L      Calcium 8.8 mg/dL      Protein, Total  6.6 g/dL      Albumin 3.1 (L) g/dL      AST (SGOT) 17 U/L      ALT 22 U/L      Alkaline Phosphatase 51 U/L      Bilirubin, Total 0.8 mg/dL      Globulin 3.5 g/dL      Albumin/Globulin Ratio 0.9    GFR [[id number]] Collected:  11/18/16 0816     Updated:  11/18/16 0852     EGFR >60.0    UA, Reflex to Microscopic (pts  3 + yrs) [[id number]]  (Abnormal) Collected:  11/18/16 0816    Specimen:  Urine Updated:  11/18/16 0849     Urine Type Clean Catch     Color, UA Yellow     Clarity, UA Clear     Specific Gravity UA 1.026     Urine pH 6.0     Leukocyte Esterase, UA Negative     Nitrite, UA Negative     Protein, UR 100 (A)     Glucose, UA Negative     Ketones UA Negative     Urobilinogen, UA Normal mg/dL      Bilirubin, UA Negative     Blood, UA Negative     RBC, UA 3 - 5 /hpf      WBC, UA 0 - 5 /hpf     CBC with differential [[id number]]  (Abnormal) Collected:  11/18/16 0816    Specimen:  Blood from Blood Updated:  11/18/16 0841     WBC 14.18 (H) x10 3/uL      Hgb 11.6 (L) g/dL      Hematocrit 23.5 (L) %      Platelets 376 x10 3/uL      RBC 3.62 (L) x10 6/uL      MCV 94.8 fL      MCH 32.0 pg      MCHC 33.8 g/dL      RDW 17 (H) %      MPV 10.1 fL      Neutrophils 68.5 %      Lymphocytes Automated 15.0 %      Monocytes 13.0 %      Eosinophils Automated 2.3 %      Basophils Automated 0.3 %      Immature Granulocyte 0.9 %      Nucleated RBC 0.0 /100 WBC      Neutrophils Absolute 9.73 (H) x10 3/uL      Abs Lymph Automated 2.12 x10 3/uL      Abs Mono Automated 1.84 (H) x10 3/uL      Abs Eos Automated 0.32 x10 3/uL      Absolute Baso Automated 0.04 x10 3/uL      Absolute Immature Granulocyte 0.13 (H) x10 3/uL      Absolute NRBC 0.00 x10 3/uL               Radiology Results:      [place] VenoDopp Low Extremity Right   Final Result    No deep venous thrombus in the  right  lower extremity.      Geanie Cooley, MD    11/18/2016 10:14 AM                  Supervisory Statements:      I have reviewed and agree with the history except as  noted above.  The pertinent physical exam has been documented.  I have reviewed and agree with the final ED diagnosis.      Scribe Attestation:      ISusie Cassette, MD, personally performed the services documented.  Shalini Boddu is scribing for me on [employer]. I reviewed and confirm the accuracy of the information in this medical record.     I, Shalini Boddu, am serving as a Neurosurgeon to document services personally performed by Susie Cassette, MD based on the provider's statements to me.                               Susie Cassette, MD  11/23/16 [phone]

## 2016-11-18 NOTE — ED Provider Notes (Signed)
Physician/Midlevel provider first contact with patient: 11/18/16 0720                                 [facility] RESIDENT H&P       CLINICAL INFORMATION        HPI:      Chief Complaint: Leg Pain  .    [name] is a 56 y.o. male s/p R TKR 01 Oct who presents w/ concern for DVT vs cellulitis.  Two days ago, pt experienced acutely worsened pain in the setting of RLE erythema and swelling.  Also endorses bulla adjacent to incision site.  Has been icing w/ direct skin contact.  Limited mobility and walking 2/2 pain.  Denies constitutional symptoms, chest pain, SOB, N/V, and GI distress.      History obtained from: patient        ROS:      Review of Systems   All other systems reviewed and are negative.        Physical Exam:      Pulse 96  BP 120/60  Resp 18  SpO2 94 %  Temp 98.3 F (36.8 C)    Physical Exam   Constitutional: He appears well-developed and well-nourished. No distress.   HENT:   Head: Normocephalic and atraumatic.   Eyes: EOM are normal.   Cardiovascular: Normal rate, regular rhythm, S1 normal and S2 normal.    Pulses:       Dorsalis pedis pulses are 2+ on the right side.        PT difficult to palpate 2/2 swelling and pain   Pulmonary/Chest: Effort normal and breath sounds normal.   Musculoskeletal:     RLE noticeable swollen from thigh to ankle when  compared to L   Skin: Capillary refill takes less than 2 seconds.     Blanching erythema from distal thigh to distal tibia    3+ pitting edema, TTP, warm    Incisional site C/D/I, 2cm bulla medial to incision   Psychiatric: He has a normal mood and affect.               PAST HISTORY        Primary Care Provider: Christa See, MD        PMH/PSH:    .     Past Medical History:   Diagnosis Date   . At risk for sleep apnea 2018    stop-bang   . Disorder of prostate     Enlarged   . History of MRSA infection 2015    right shoulder wound-no longer present   . Hypertension 2008    128/78   . Malignant tumor of prostate 06/2016     no chemo//no radiation   . MRSA (methicillin resistant staph aureus) culture positive 11/08/2016     Postitive throat culture        He has a past surgical history that includes Splenectomy (2012); Spinal fusion (1990); DEBRIDEMENT & IRRIGATION, UPPER EXTREMITY (06/04/2013); BIOPSY, PROSTATE, TRANSRECTAL ULTRASOUND, (TRUS) (N/A, 12/09/2014); Knee arthroscopy (Right, 2017); BIOPSY, PROSTATE, TRANSRECTAL ULTRASOUND, (TRUS) (N/A, 05/02/2016); Colonoscopy (2017); ROBOTIC, PROSTATECTOMY W/ PELVIC LYMPH NODE DISSECTION (Bilateral, 07/17/2016); PROSTATECTOMY, RADICAL (Bilateral, 07/17/2016); LAPAROSCOPIC, ENTEROLYSIS (N/A, 07/17/2016); and ARTHROPLASTY, KNEE, TOTAL (Right, 11/13/2016).      Social/Family History:      He reports that he has never smoked. His smokeless tobacco use includes Snuff. He reports that  he drinks about 2.4 oz of alcohol per week . He reports that he does not use drugs.    No family history on file.      Listed Medications on Arrival:    .     Home Medications             aspirin EC 325 MG EC tablet     Take 1 tablet (325 mg total) by mouth 2 (two) times daily.for 14 days     HYDROmorphone (DILAUDID) 4 MG tablet     Take 1-2 tablets (4-8 mg total) by mouth every 3 (three) hours as needed (severe pain).     saccharomyces boulardii (FLORASTOR) 250 MG capsule     Take 250 mg by mouth daily.     valsartan-hydroCHLOROthiazide (DIOVAN-HCT) 320-25 MG per tablet     Take 1 tablet by mouth daily.             Allergies: He has No Known Allergies.            VISIT INFORMATION        Reassessments/Clinical Course:    ED Course:  Meds - Dilaudid 3.4mg , Vanc 2g x1  Imaging - DVT U/S    Attempted to contact [facility] at 1000 and 1030  At 1100 spoke w/ operator who forwarded a message to Dr. Maurice March  1150 spoke w/ Dr. Lorin Picket who recommended admission to hospitalist        Conversations with Other Providers:              Medications Given in the ED:    .     ED Medication Orders     None            Procedures:       Procedures      Assessment/Plan:    56yo M POD5 s/p R TKA here w/ worsening erythema concerning for cellulitis.  No systemic symptoms; however, given h/o ?MRSA will send BCx and start Vanc.  Although pt endorsing pain out of proportion and intermittent paresthesias, low suspicion for compartment syndrome given period from surgery.  Plan to admit to Inpatient Medicine for pain control and IV abx.  Ortho to see pt today or tomorrow.          Farrell Ours, MD  Resident  11/18/16 [phone]
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
## 2016-11-18 NOTE — Plan of Care (Signed)
Problem: Safety  Goal: Patient will be free from injury during hospitalization  Outcome: Progressing   11/18/16 1726   Goal/Interventions addressed this shift   Patient will be free from injury during hospitalization  Assess patient's risk for falls and implement fall prevention plan of care per policy;Provide and maintain safe environment;Use appropriate transfer methods;Ensure appropriate safety devices are available at the bedside;Include patient/ family/ care giver in decisions related to safety;Hourly rounding;Assess for patients risk for elopement and implement Elopement Risk Plan per policy;Provide alternative method of communication if needed (communication boards, writing)     Goal: Patient will be free from infection during hospitalization  Outcome: Not Progressing   11/18/16 1726   Goal/Interventions addressed this shift   Free from Infection during hospitalization Assess and monitor for signs and symptoms of infection   RLE cellulitis. IV abx to be given as ordered    Problem: Pain  Goal: Pain at adequate level as identified by patient  Outcome: Not Progressing   11/18/16 1726   Goal/Interventions addressed this shift   Pain at adequate level as identified by patient Identify patient comfort function goal;Assess for risk of opioid induced respiratory depression, including snoring/sleep apnea. Alert healthcare team of risk factors identified.;Assess pain on admission, during daily assessment and/or before any "as needed" intervention(s);Reassess pain within 30-60 minutes of any procedure/intervention, per Pain Assessment, Intervention, Reassessment (AIR) Cycle;Evaluate if patient comfort function goal is met;Offer non-pharmacological pain management interventions;Evaluate patient's satisfaction with pain management progress   10/10 pain in RLE. IV dilaudid given with relief    Problem: Side Effects from Pain Analgesia  Goal: Patient will experience minimal side effects of analgesic therapy  Outcome:  Progressing   11/18/16 1726   Goal/Interventions addressed this shift   Patient will experience minimal side effects of analgesic therapy Monitor/assess patient's respiratory status (RR depth, effort, breath sounds);Assess for changes in cognitive function;Prevent/manage side effects per LIP orders (i.e. nausea, vomiting, pruritus, constipation, urinary retention, etc.);Evaluate for opioid-induced sedation with appropriate assessment tool (i.e. POSS)       Problem: Discharge Barriers  Goal: Patient will be discharged home or other facility with appropriate resources  Outcome: Progressing   11/18/16 1726   Goal/Interventions addressed this shift   Discharge to home or other facility with appropriate resources Provide appropriate patient education;Provide information on available health resources;Initiate discharge planning       Problem: Psychosocial and Spiritual Needs  Goal: Demonstrates ability to cope with hospitalization/illness  Outcome: Progressing   11/18/16 1726   Goal/Interventions addressed this shift   Demonstrates ability to cope with hospitalizations/illness Encourage verbalization of feelings/concerns/expectations;Provide quiet environment       Problem: Infection  Goal: Free from infection  Outcome: Not Progressing   11/18/16 1726   OTHER   Free from infection  Assess for signs/symptoms of infection;Utilize isolation precautions per protocol/policy;Assess immunization status;Consult/collaborate with Infection Preventionist;Utilize sepsis protocol   Initiating contact precautions for hx of MRSA. IV abx to be administered as ordered    Problem: Compromised skin integrity  Goal: Skin integrity is maintained or improved  Outcome: Progressing   11/18/16 1726   Goal/Interventions addressed this shift   Skin integrity is maintained or improved Assess Braden Scale every shift;Relieve pressure to bony prominences;Avoid shearing       Problem: Impaired Mobility  Goal: Mobility/Activity is maintained at optimal  level for patient  Outcome: Not Progressing   11/18/16 1726   Goal/Interventions addressed this shift   Mobility/activity is maintained at optimal level for  patient Increase mobility as tolerated/progressive mobility;Maintain proper body alignment;Plan activities to conserve energy, plan rest periods   Not able to ambulate given RLE pain    Problem: Peripheral Neurovascular Impairment  Goal: Extremity color, movement, sensation are maintained or improved  Outcome: Progressing   11/18/16 1726   Goal/Interventions addressed this shift   Extremity color, movement, sensation are maintained or improved  Increase mobility as tolerated/progressive mobility;Assess and monitor application of corrective devices (cast, brace, splint), check skin integrity;Assess extremity for proper alignment;Teach/review/reinforce ankle pump exercises;VTE Prevention: Administer anticoagulant(s) and/or apply anti-embolism stockings/devices as ordered       Comments: Pt arrived to unit via stretcher at 1635. Pt arrived screaming and crying in pain from ED. Pt assisted to bed using 4 staff members and pain medication given. Once pain was under control, pt and wife oriented to unit and unit policies and procedures. Call bell given to pt and appropriate use explained to pt. All appropriate fall interventions initiated. Pt vital signs are stable, will continue to monitor.    Admission packet reviewed with patient/family: yes  Reviewed patient's belongings and home medications: [facility] meds pharmacy completed  Existing lines/Foley evaluated: n/a  Arm bands applied: falls

## 2016-11-19 DIAGNOSIS — L03115 Cellulitis of right lower limb: Secondary | ICD-10-CM

## 2016-11-19 LAB — CBC AND DIFFERENTIAL
Absolute NRBC: 0 10*3/uL
Basophils Absolute Automated: 0.04 10*3/uL (ref 0.00–0.20)
Basophils Automated: 0.2 %
Eosinophils Absolute Automated: 0.31 10*3/uL (ref 0.00–0.70)
Eosinophils Automated: 1.8 %
Hematocrit: 31.9 % — ABNORMAL LOW (ref 42.0–52.0)
Hgb: 10.4 g/dL — ABNORMAL LOW (ref 13.0–17.0)
Immature Granulocytes Absolute: 0.15 10*3/uL — ABNORMAL HIGH
Immature Granulocytes: 0.9 %
Lymphocytes Absolute Automated: 2.43 10*3/uL (ref 0.50–4.40)
Lymphocytes Automated: 14 %
MCH: 31.4 pg (ref 28.0–32.0)
MCHC: 32.6 g/dL (ref 32.0–36.0)
MCV: 96.4 fL (ref 80.0–100.0)
MPV: 9.5 fL (ref 9.4–12.3)
Monocytes Absolute Automated: 2.33 10*3/uL — ABNORMAL HIGH (ref 0.00–1.20)
Monocytes: 13.5 %
Neutrophils Absolute: 12.06 10*3/uL — ABNORMAL HIGH (ref 1.80–8.10)
Neutrophils: 69.6 %
Nucleated RBC: 0 /100 WBC (ref 0.0–1.0)
Platelets: 377 10*3/uL (ref 140–400)
RBC: 3.31 10*6/uL — ABNORMAL LOW (ref 4.70–6.00)
RDW: 17 % — ABNORMAL HIGH (ref 12–15)
WBC: 17.32 10*3/uL — ABNORMAL HIGH (ref 3.50–10.80)

## 2016-11-19 MED ORDER — SENNOSIDES-DOCUSATE SODIUM 8.6-50 MG PO TABS
2.0000 | ORAL_TABLET | Freq: Every evening | ORAL | Status: DC
Start: 2016-11-19 — End: 2016-11-27
  Administered 2016-11-20 – 2016-11-24 (×4): 2 via ORAL
  Filled 2016-11-19: qty 2
  Filled 2016-11-19: qty 1
  Filled 2016-11-19 (×5): qty 2

## 2016-11-19 MED ORDER — ACETAMINOPHEN 325 MG PO TABS
650.0000 mg | ORAL_TABLET | Freq: Four times a day (QID) | ORAL | Status: DC | PRN
Start: 2016-11-19 — End: 2016-11-27
  Administered 2016-11-20 – 2016-11-25 (×2): 650 mg via ORAL
  Filled 2016-11-19 (×2): qty 2

## 2016-11-19 MED ORDER — POLYETHYLENE GLYCOL 3350 17 G PO PACK
17.0000 g | PACK | Freq: Every day | ORAL | Status: DC | PRN
Start: 2016-11-19 — End: 2016-11-27
  Administered 2016-11-21: 17:00:00 17 g via ORAL
  Filled 2016-11-19: qty 1

## 2016-11-19 NOTE — Progress Notes (Signed)
Patient called for pain medication and assigned RN was busy in another room so she called the charge RN Hydrographic surveyor) and asked for assistance giving pain medication. Writer had to get dilauded from both accudoses because there wasn't enough dilaudid available in one accudose to cover the patient. Writer was assisted by Cletis Athens, a new nurse orienting during this shift. Writer and Cletis Athens gathered supplies and went to the patient's room where he could be heard moaning through the door. Patient is on contact precautions so the writer and Cletis Athens had to stop and put gowns on before entering. Just before the door was pulled open the patient began yelling as loud as he could, swearing and saying that this was unacceptable. When the door was opened the patient threw his call bell which bounced hard on the floor and began swearing at the writer and Cletis Athens at the top of his lungs, before stuffing a cloth he brought from home in his mouth and breathing very hard. The Clinical research associate and Cletis Athens prepared his medications and delivered them quickly and safely. Afterwards the writer calmly told the patient that she understood that he was in pain, however it is inappropriate to yell and swear at the staff, and it is also inappropriate to throw things and try to break hospital equipment. The patient again yelled at the writer saying he had been calling for over 45 minutes for pain medication and that it was torture for him, and that we should just get him out of here if this is the treatment he was going to receive. His wife agreed with him, saying that this was unacceptable. When the writer spoke with the patient's RN, the writer was told that the patient only called once, and that as soon as he called for pain medication the RN called the writer to ask for help. Security was notified of the patient's outburst and is on standby if needed.

## 2016-11-19 NOTE — Progress Notes (Signed)
I saw the patient this morning evaluating him for possible infection of the right knee.  The knee was exposed with no dressing and evaluation revealed no obvious drainage from the incision site.  There was considerable redness below the knee down to level of the ankle.  The blister was noted on the medial aspect just below the incision, which I think is swelling postoperatively.  There is sign of a dried blister on the opposite side of the leg below the level of the incision.  The compartments are soft.  The patient can move his toes actively and passively without pain in the leg.  He still very tender to touch in the lower extremity below the level of the knee.  Rubor extends all the way from the knee down to the foot.  There is no thigh tenderness.  There is no real pain or swelling around the knee itself.  There is minimal swelling as would be expected postoperatively for less than a week after surgery.    The patient's white count is up to 17,000.  At this point.  He is presently on vancomycin, which I agree with.  I think infectious disease help would be prudent and we will try to arrange that later today.      The patient admitted to going home and injecting himself with anabolic steroid.  This certainly can complicate the issue and he understands it.  He had a previously treated MRSA infection in the right shoulder, which required a PICC line.  This resolved and is not had any problems for several years now.    I would treat this patient with cleaning the incision with alcohol and not soap and water.  I would keep a dry dressing on the incision.  I would place her ABDs and a loose wrapped Ace.  We will monitor his complete blood count and temperature and see how he does.  I appreciate the infectious disease help and once obtained.  We will follow that advice.  The patient understands that he may have to have his incision opened and the knee washed out.  If he deteriorates or seems to have persistent severe  problems about the knee itself.  For now, I think it reasonable to watch as most of the symptoms seemed to be in the lower leg below the level of the knee.    I did discuss the patient's pain with him and advised him to be less aggressive.  He feels that the steroids may have something to do with this and certainly notes a possibility as well.    Today, the patient seems comfortable.  He is lying in the bed without significant severe pain.  Pain is elicited when palpating the leg, but not when the patient is at rest or when any passive motion of the foot is elicited.  Patient is continuing on IV vancomycin and will be monitored.    Appreciate all help from the medical staff.

## 2016-11-19 NOTE — Plan of Care (Signed)
Problem: Safety  Goal: Patient will be free from injury during hospitalization  Outcome: Progressing   11/19/16 0807   Goal/Interventions addressed this shift   Patient will be free from injury during hospitalization  Assess patient's risk for falls and implement fall prevention plan of care per policy;Provide and maintain safe environment;Use appropriate transfer methods;Include patient/ family/ care giver in decisions related to safety;Ensure appropriate safety devices are available at the bedside;Hourly rounding;Assess for patients risk for elopement and implement Elopement Risk Plan per policy     Goal: Patient will be free from infection during hospitalization  Outcome: Progressing   11/19/16 0807   Goal/Interventions addressed this shift   Free from Infection during hospitalization Assess and monitor for signs and symptoms of infection;Monitor lab/diagnostic results       Problem: Pain  Goal: Pain at adequate level as identified by patient  Outcome: Progressing   11/19/16 0807   Goal/Interventions addressed this shift   Pain at adequate level as identified by patient Identify patient comfort function goal;Assess for risk of opioid induced respiratory depression, including snoring/sleep apnea. Alert healthcare team of risk factors identified.;Assess pain on admission, during daily assessment and/or before any "as needed" intervention(s);Reassess pain within 30-60 minutes of any procedure/intervention, per Pain Assessment, Intervention, Reassessment (AIR) Cycle;Evaluate patient's satisfaction with pain management progress;Evaluate if patient comfort function goal is met;Offer non-pharmacological pain management interventions;Consult/collaborate with Pain Service       Problem: Side Effects from Pain Analgesia  Goal: Patient will experience minimal side effects of analgesic therapy  Outcome: Progressing   11/19/16 0807   Goal/Interventions addressed this shift   Patient will experience minimal side effects of  analgesic therapy Monitor/assess patient's respiratory status (RR depth, effort, breath sounds);Assess for changes in cognitive function;Prevent/manage side effects per LIP orders (i.e. nausea, vomiting, pruritus, constipation, urinary retention, etc.);Evaluate for opioid-induced sedation with appropriate assessment tool (i.e. POSS)       Problem: Discharge Barriers  Goal: Patient will be discharged home or other facility with appropriate resources  Outcome: Progressing   11/19/16 0807   Goal/Interventions addressed this shift   Discharge to home or other facility with appropriate resources Provide appropriate patient education;Provide information on available health resources;Initiate discharge planning       Problem: Psychosocial and Spiritual Needs  Goal: Demonstrates ability to cope with hospitalization/illness  Outcome: Progressing   11/19/16 0807   Goal/Interventions addressed this shift   Demonstrates ability to cope with hospitalizations/illness Encourage verbalization of feelings/concerns/expectations;Provide quiet environment;Assist patient to identify own strengths and abilities;Encourage patient to set small goals for self;Encourage participation in diversional activity;Reinforce positive adaptation of new coping behaviors       Problem: Moderate/High Fall Risk Score >5  Goal: Patient will remain free of falls  Outcome: Progressing   11/18/16 2051   OTHER   Moderate Risk (6-13) MOD-Place Fall Risk level on whiteboard in room       Problem: Infection  Goal: Free from infection  Outcome: Progressing   11/19/16 0807   OTHER   Free from infection  Assess for signs/symptoms of infection;Utilize isolation precautions per protocol/policy;Assess immunization status;Consult/collaborate with Infection Preventionist;Utilize sepsis protocol       Problem: Compromised skin integrity  Goal: Skin integrity is maintained or improved  Outcome: Progressing   11/19/16 0807   Goal/Interventions addressed this shift   Skin  integrity is maintained or improved Assess Braden Scale every shift;Turn or reposition patient every 2 hours or as needed unless able to reposition self;Increase activity as  tolerated/progressive mobility;Relieve pressure to bony prominences;Keep skin clean and dry;Avoid shearing;Encourage use of lotion/moisturizer on skin       Problem: Impaired Mobility  Goal: Mobility/Activity is maintained at optimal level for patient  Outcome: Progressing   11/19/16 0807   Goal/Interventions addressed this shift   Mobility/activity is maintained at optimal level for patient Perform active/passive ROM;Encourage independent activity per ability;Increase mobility as tolerated/progressive mobility;Maintain proper body alignment;Plan activities to conserve energy, plan rest periods;Reposition patient every 2 hours and as needed unless able to reposition self;Assess for changes in respiratory status, level of consciousness and/or development of fatigue       Problem: Peripheral Neurovascular Impairment  Goal: Extremity color, movement, sensation are maintained or improved  Outcome: Progressing   11/19/16 0807   Goal/Interventions addressed this shift   Extremity color, movement, sensation are maintained or improved  Increase mobility as tolerated/progressive mobility;Assess and monitor application of corrective devices (cast, brace, splint), check skin integrity;Teach/review/reinforce ankle pump exercises;Assess extremity for proper alignment;VTE Prevention: Administer anticoagulant(s) and/or apply anti-embolism stockings/devices as ordered       Comments: Pt is a moderate falls risk. Nurse educated pt on the use of the call bell to obtain staff assistance for all transfers and ambulation. Pt demonstrated understanding on use of call bell and verbally contracted with the bedside nurse to call for assistance before attempting to get out of bed. Pt room is clean, clutter free and adequately lit. Pt call bell, personal belongings and  bedside table are within reach. Pt has non slip socks on.Intentional hourly rounding to continue per protocol.

## 2016-11-19 NOTE — Progress Notes (Signed)
Patient to be transferred to Orthopedics, room # 847, per Dr. Bonnetta Barry request; report called to [facility]; all questions answered; room is still being cleaned; will medicate with analgesic, administer Zosyn IV per orders, and await call from RN when room is ready; patient aware of plan.

## 2016-11-19 NOTE — Progress Notes (Signed)
Patient transferred to room 847 from T 9. Alert and oriented x 4. Orient to room. Pain level 10/10. Encouraged to use intensive spirometry. Applied ice bags. Call bell and bed side table within reach. Will continue to monitor.

## 2016-11-19 NOTE — Progress Notes (Signed)
RN noted a call from the pt on the spectra link  for pain  , RN was busy with another pt and called the charge nurse to assist in medicating pt. RN overheard a loud voice cursing. The charge updated  RN that pt was verbally and physically aggressive. MD is aware.

## 2016-11-19 NOTE — Progress Notes (Signed)
Transport called to take patient to [facility], room 847.
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
## 2016-11-19 NOTE — Progress Notes (Signed)
MEDICINE PROGRESS NOTE    Date Time: 11/19/16 9:40 AM  Patient Name: [name]  Attending Physician: Chinita Greenland III*    Assessment:     # R Knee infection   # s/p R knee replacement on 10/1  # R LE Cellulitis  # Normocytic Anemia  # Hypertension     Patient is a 56 years old male with h/o HTN and recent R knee replacement by Dr. Maurice March admitted with concerns of knee infection and LE cellulitis. Patient was started on IV ABx. ID consulted.     Plan:     - Currently afebrile, + leukocytosis, hemodynamically stable  - Continue IV Vancomycin and Zosyn ~ Day 2  - ID consulted. Blood cultures * 2 pending  - Orthopedic is following, monitor with Abx, will consider surgical washout if no improvement  - H/H stable, no signs of bleeding. Will c/w to monitor  - BP stable, c/w current meds      Case discussed with: Patient, bedside RN, and Ortho Dr. Maurice March  * Spoke with Dr. Maurice March agreed to accept patient to Orthopedic service and send to ortho floor. Medicine will follow as a consult.     Safety Checklist:     DVT prophylaxis:  CHEST guideline (See page [id number]) Chemical and Mechanical   Foley:  [place] Rn Foley protocol Not present   IVs:  Peripheral IV   PT/OT: Not needed   Daily CBC & or Chem ordered:  SHM/ABIM guidelines (see #5) Yes, due to clinical and lab instability   Reference for approximate charges of common labs: CBC auto diff - $76  BMP - $99  Mg - $79    Lines:     Patient Lines/Drains/Airways Status    Active PICC Line / CVC Line / PIV Line / Drain / Airway / Intraosseous Line / Epidural Line / ART Line / Line / Wound / Pressure Ulcer / NG/OG Tube     Name:   Placement date:   Placement time:   Site:   Days:    Peripheral IV 11/18/16 Right Forearm  11/18/16    0800    Forearm    1    Peripheral IV 11/18/16 Left Antecubital  11/18/16    1129    Antecubital    less than 1    Incision Site 11/19/16 Knee Right  11/19/16    0800      less than 1                 Subjective     CC: <principal problem not  specified>    Interval History/24 hour events: Admitted    HPI/Subjective:   Doing well this morning. Less throbbing and tightness in RLE but still painful when he ambulates.   He denies any CP, SOB, abdominal pain, N/V     Review of Systems:     As per HPI    Physical Exam:     VITAL SIGNS PHYSICAL EXAM   Temp:  [99.4 F (37.4 C)-100 F (37.8 C)] 100 F (37.8 C)  Heart Rate:  [83-98] 98  Resp Rate:  [15-24] 18  BP: (105-137)/(53-65) 114/53    Intake/Output Summary (Last 24 hours) at 11/19/16 0940  Last data filed at 11/18/16 1952   Gross per 24 hour   Intake                0 ml   Output  175 ml   Net             -175 ml    Physical Exam  General: AAO*3, NAD  Cardiovascular: regular rate and rhythm, no murmurs, rubs or gallops  Lungs: clear to auscultation bilaterally, without wheezing, rhonchi, or rales  Abdomen: soft, non-tender, non-distended; no palpable masses,  normoactive bowel sounds  Extremities: no edema  + Knee edema, erythema, + large blisters below knee       Meds:     Medications were reviewed:  Current Facility-Administered Medications   Medication Dose Route Frequency   . enoxaparin  40 mg Subcutaneous Daily   . morphine  15 mg Oral Q12H SCH   . piperacillin-tazobactam  4.5 g Intravenous Q6H   . vancomycin  1,500 mg Intravenous Q12H     Current Facility-Administered Medications   Medication Dose Route Frequency Last Rate     Current Facility-Administered Medications   Medication Dose Route   . HYDROmorphone  4 mg Intravenous   . naloxone  0.2 mg Intravenous   . oxyCODONE-acetaminophen  1 tablet Oral       Labs:     Labs (last 72 hours):      Recent Labs  Lab 11/19/16  0046 11/18/16  0816   WBC 17.32* 14.18*   Hgb 10.4* 11.6*   Hematocrit 31.9* 34.3*   Platelets 377 376            Recent Labs  Lab 11/18/16  0816 11/14/16  0439   Sodium 135* 136   Potassium 4.0 4.0   Chloride 98* 105   CO2 28 23   BUN 18.0 16.0   Creatinine 1.1 1.1   Calcium 8.8 8.1*   Albumin 3.1*  --    Protein, Total  6.6  --    Bilirubin, Total 0.8  --    Alkaline Phosphatase 51  --    ALT 22  --    AST (SGOT) 17  --    Glucose 99 103*          Microbiology, reviewed and are significant for:  Microbiology Results     Procedure Component Value Units Date/Time    CULTURE BLOOD AEROBIC AND ANAEROBIC [[id number]] Collected:  11/18/16 0833    Specimen:  Blood from Blood, Venipuncture Updated:  11/18/16 1420    Narrative:       The order will result in two separate 8-22ml bottles  Please do NOT order repeat blood cultures if one has been  drawn within the last 48 hours.  AVOID BLOOD CULTURE DRAWS FROM CENTRAL LINE IF POSSIBLE  Indications:->Bacteremia  1 BLUE+1 PURPLE    CULTURE BLOOD AEROBIC AND ANAEROBIC [[id number]] Collected:  11/18/16 1124    Specimen:  Blood from Blood, Venipuncture Updated:  11/18/16 1420    Narrative:       The order will result in two separate 8-57ml bottles  Please do NOT order repeat blood cultures if one has been  drawn within the last 48 hours.  AVOID BLOOD CULTURE DRAWS FROM CENTRAL LINE IF POSSIBLE  Indications:->Bacteremia  1 BLUE+1 PURPLE          Imaging, reviewed      Signed by: Birdena Crandall, MD

## 2016-11-19 NOTE — UM Notes (Signed)
56 YEAR OLD MALE CAME TO [facility] ER WITH C/O R KNEE PAIN AND REDNESS. HAD TOTAL KNEE REPLACEMENT ON 10/2. ON 10/4 AWOKE WITH PAIN AND KNEE ERYTHEMA AND SWELLING. THIS AM UNABLE TO MOVE LEG.    VS-BP-107/55, P- 96, R- 24, T-98.3 LOP 7-10    LABS- WBC 14.18--17.32, BLOOD CX PENDING    RLE DOPPLERS-  No deep venous thrombus in the  right  lower extremity.    PMH- PROSTATE ENLG., HTN, KNEE ARTHROPLASTY, TURP, RADICAL PROSTATECTOMY, SPINAL FUSION, SPLENECTOMY    PLAN- ADMIT TO INPATIENT CLASS 11/18/16 1228    MED TELE    - leukocytosis may be related to steroid use  - Holding home diovan, will prescribe valsartan instead  - vancomycin 2g given in ED  - vancomycin 1.75g q12 hrs, vanc trough ordered for tomorrow night   - zosyn 4.5 q8hrs  - blood cx  - ortho (Dr Maurice March) aware of patient - partner Dr Lorin Picket will evaluate patient today. They are aware that he is having intermittent paresthesias but have low suspicion for compartment surgery given time period since operation  - severe pain despite dilaudid 2 mg, will increase to dilaudid 3mg  q2hr prn with ms contin for long acting      Admit to Inpatient (Order #[id number]) on 11/18/16    Wound and Skin Management [facility] - Clinical Indications for Procedure    Verdia Kuba RN, BSN  Utilization Review Case Manager  [facility]  UR Case Mgmt.  [phone] [phone]

## 2016-11-20 DIAGNOSIS — I10 Essential (primary) hypertension: Secondary | ICD-10-CM

## 2016-11-20 LAB — CBC WITH MANUAL DIFFERENTIAL
Absolute NRBC: 0 10*3/uL
Atypical Lymphocytes %: 1 %
Atypical Lymphocytes Absolute: 0.16 10*3/uL — ABNORMAL HIGH
Band Neutrophils Absolute: 0.16 10*3/uL (ref 0.00–1.00)
Band Neutrophils: 1 %
Basophils Absolute Manual: 0.16 10*3/uL (ref 0.00–0.20)
Basophils Manual: 1 %
Cell Morphology: ABNORMAL — AB
Eosinophils Absolute Manual: 0.16 10*3/uL (ref 0.00–0.70)
Eosinophils Manual: 1 %
Hematocrit: 31.3 % — ABNORMAL LOW (ref 42.0–52.0)
Hgb: 10.2 g/dL — ABNORMAL LOW (ref 13.0–17.0)
Lymphocytes Absolute Manual: 1.27 10*3/uL (ref 0.50–4.40)
Lymphocytes Manual: 8 %
MCH: 31.6 pg (ref 28.0–32.0)
MCHC: 32.6 g/dL (ref 32.0–36.0)
MCV: 96.9 fL (ref 80.0–100.0)
MPV: 10.1 fL (ref 9.4–12.3)
Metamyelocytes Absolute: 0.16 10*3/uL — ABNORMAL HIGH
Metamyelocytes: 1 %
Monocytes Absolute: 2.07 10*3/uL — ABNORMAL HIGH (ref 0.00–1.20)
Monocytes Manual: 13 %
Myelocytes Absolute: 0.16 10*3/uL — ABNORMAL HIGH
Myelocytes: 1 %
Neutrophils Absolute Manual: 11.61 10*3/uL — ABNORMAL HIGH (ref 1.80–8.10)
Nucleated RBC: 0 /100 WBC (ref 0.0–1.0)
Platelet Estimate: NORMAL
Platelets: 433 10*3/uL — ABNORMAL HIGH (ref 140–400)
RBC: 3.23 10*6/uL — ABNORMAL LOW (ref 4.70–6.00)
RDW: 17 % — ABNORMAL HIGH (ref 12–15)
Segmented Neutrophils: 73 %
WBC: 15.9 10*3/uL — ABNORMAL HIGH (ref 3.50–10.80)

## 2016-11-20 LAB — C-REACTIVE PROTEIN: C-Reactive Protein: 14.1 mg/dL — ABNORMAL HIGH (ref 0.0–0.8)

## 2016-11-20 LAB — VANCOMYCIN, TROUGH: Vancomycin Trough: 9.3 ug/mL — ABNORMAL LOW (ref 10.0–20.0)

## 2016-11-20 LAB — SEDIMENTATION RATE: Sed Rate: 70 mm/Hr — ABNORMAL HIGH (ref 0–15)

## 2016-11-20 MED ORDER — HYDROMORPHONE HCL 4 MG PO TABS
8.0000 mg | ORAL_TABLET | ORAL | Status: DC | PRN
Start: 2016-11-20 — End: 2016-11-27
  Administered 2016-11-20 – 2016-11-26 (×19): 8 mg via ORAL
  Filled 2016-11-20 (×19): qty 2

## 2016-11-20 MED ORDER — HYDROMORPHONE HCL 4 MG PO TABS
4.0000 mg | ORAL_TABLET | ORAL | Status: DC | PRN
Start: 2016-11-20 — End: 2016-11-27
  Administered 2016-11-23 – 2016-11-27 (×11): 4 mg via ORAL
  Filled 2016-11-20 (×11): qty 1

## 2016-11-20 MED ORDER — HYDROMORPHONE HCL 1 MG/ML IJ SOLN
1.0000 mg | INTRAMUSCULAR | Status: DC | PRN
Start: 2016-11-20 — End: 2016-11-20
  Administered 2016-11-20 (×2): 1 mg via INTRAVENOUS
  Filled 2016-11-20 (×2): qty 1

## 2016-11-20 MED ORDER — HYDROMORPHONE HCL 1 MG/ML IJ SOLN
2.0000 mg | INTRAMUSCULAR | Status: DC | PRN
Start: 2016-11-20 — End: 2016-11-22
  Administered 2016-11-20 – 2016-11-22 (×18): 2 mg via INTRAVENOUS
  Filled 2016-11-20 (×3): qty 2
  Filled 2016-11-20: qty 1
  Filled 2016-11-20 (×11): qty 2
  Filled 2016-11-20: qty 1
  Filled 2016-11-20 (×4): qty 2

## 2016-11-20 MED ORDER — VALSARTAN 160 MG PO TABS
320.0000 mg | ORAL_TABLET | Freq: Every day | ORAL | Status: DC
Start: 2016-11-20 — End: 2016-11-27
  Administered 2016-11-21 – 2016-11-27 (×7): 320 mg via ORAL
  Filled 2016-11-20 (×8): qty 2

## 2016-11-20 MED ORDER — HYDROCHLOROTHIAZIDE 25 MG PO TABS
25.0000 mg | ORAL_TABLET | Freq: Every day | ORAL | Status: DC
Start: 2016-11-20 — End: 2016-11-27
  Administered 2016-11-21 – 2016-11-27 (×7): 25 mg via ORAL
  Filled 2016-11-20 (×8): qty 1

## 2016-11-20 MED ORDER — GABAPENTIN 300 MG PO CAPS
300.0000 mg | ORAL_CAPSULE | Freq: Three times a day (TID) | ORAL | Status: DC
Start: 2016-11-20 — End: 2016-11-27
  Administered 2016-11-20 – 2016-11-27 (×22): 300 mg via ORAL
  Filled 2016-11-20 (×22): qty 1

## 2016-11-20 MED ORDER — VALSARTAN 320 MG PO TABS
1.0000 | ORAL_TABLET | Freq: Every day | ORAL | Status: DC
Start: 2016-11-20 — End: 2016-11-20
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
## 2016-11-20 NOTE — Progress Notes (Addendum)
Patient without significant complaints. Pain reasonably well controlled at this time. Per RN, patient's pain medications will expire and new orders need to be created. Patient is requesting 4mg  of IV Dilaudid for pain which was ordered this weekend by his ER providers. Following his R TKR on 10/1, he was prescribed 4mg  of PO Dilaudid at discharge. Patient is afebrile this AM. WBC of 15.9. Tolerating PO without N/V. Ambulating minimally as tolerated around the room using walker.    Vitals:    11/20/16 0730   BP: 119/40   Pulse: 77   Resp: 18   Temp: 98.1 F (36.7 C)   SpO2: 95%     No intake or output data in the 24 hours ending 11/20/16 9629    Exam:    Awake and alert.  Oriented X 3    Dressing clean, dry and intact. Dressing changed today. RLE erythema, warmth and edema present.  This area remains very tender to palpation. Full passive and active ROM of his right toes. Distal pulses intact.     Calves soft, non tender.  Negative Homans.      Recent Labs  Lab 11/20/16  0430   WBC 15.90*   Hgb 10.2*   Hematocrit 31.3*         Recent Labs  Lab 11/18/16  0816   Sodium 135*   Potassium 4.0   Chloride 98*   CO2 28   BUN 18.0   Creatinine 1.1   Glucose 99   Calcium 8.8           Assessment:    56 y/o male 1 week s/p R TKR presents with RLE cellulitis and worsening pain    Plan:    1. Continue to monitor pain level and medications ordered. I am not comfortable administering 4mg  of IV Dilaudid that was ordered previously. Spoke with Flo, pain management NP, regarding patient and she suggested administering gabapentin for additional relief.   2. Dressing changed today.  3. Continue to mobilize as tolerated.   4. Patient receiving Lovenox at this time instead of Aspirin for anti-coagulation s/p TKR. RLE ultrasound was negative. Will discuss w/ Dr. Maurice March.   5. Blood cultures so far show no growth. Still pending final results.  6. Patient to be evaluated by infectious disease later today. Appreciate any recommendations.  7.  Patient to be seen by myself tomorrow AM.

## 2016-11-20 NOTE — Plan of Care (Signed)
Problem: Safety  Goal: Patient will be free from injury during hospitalization  Outcome: Progressing   11/20/16 0509   Goal/Interventions addressed this shift   Patient will be free from injury during hospitalization  Assess patient's risk for falls and implement fall prevention plan of care per policy;Provide and maintain safe environment;Use appropriate transfer methods;Ensure appropriate safety devices are available at the bedside;Include patient/ family/ care giver in decisions related to safety;Hourly rounding       Problem: Pain  Goal: Pain at adequate level as identified by patient  Outcome: Progressing   11/20/16 0509   Goal/Interventions addressed this shift   Pain at adequate level as identified by patient Identify patient comfort function goal;Assess for risk of opioid induced respiratory depression, including snoring/sleep apnea. Alert healthcare team of risk factors identified.;Assess pain on admission, during daily assessment and/or before any "as needed" intervention(s);Reassess pain within 30-60 minutes of any procedure/intervention, per Pain Assessment, Intervention, Reassessment (AIR) Cycle;Evaluate if patient comfort function goal is met;Evaluate patient's satisfaction with pain management progress;Offer non-pharmacological pain management interventions       Problem: Side Effects from Pain Analgesia  Goal: Patient will experience minimal side effects of analgesic therapy  Outcome: Progressing   11/20/16 0509   Goal/Interventions addressed this shift   Patient will experience minimal side effects of analgesic therapy Monitor/assess patient's respiratory status (RR depth, effort, breath sounds);Assess for changes in cognitive function       Problem: Moderate/High Fall Risk Score >5  Goal: Patient will remain free of falls  Outcome: Progressing   11/18/16 2051   OTHER   Moderate Risk (6-13) MOD-Place Fall Risk level on whiteboard in room       Problem: Infection  Goal: Free from infection  Outcome:  Progressing   11/19/16 0807   OTHER   Free from infection  Assess for signs/symptoms of infection;Utilize isolation precautions per protocol/policy;Assess immunization status;Consult/collaborate with Infection Preventionist;Utilize sepsis protocol       Problem: Compromised skin integrity  Goal: Skin integrity is maintained or improved  Outcome: Progressing   11/20/16 0509   Goal/Interventions addressed this shift   Skin integrity is maintained or improved Assess Braden Scale every shift       Comments: Pt awake at this time and enjoying TV show. Pleasant to staff.  Had an episode of disorientation when awakened abruptly from sleep by the IV pump alarm.  Wife at the bedside and reoriented pt. Wife also helpful with pt care.  medicated with dilaudid IV specially when making trips to the bathroom.IV Dilaudid effective.  Right leg still red from thigh to ankle. Also looks tight from swelling. Blister to left lateral side.  Continues with IV antibiotic therapy. Call light within reach. Personal items alos within reach.  Removed mat per pt's request

## 2016-11-20 NOTE — Progress Notes (Signed)
Temp 100.7, WBC slightly decreased to 15.9, reportedly comfortable on dilaudid. Pt admitted to me yesterday AM that he had injected himself after he went home from total knee with anabolic steroids. He appearantly told Dr Jon Billings that he did not. Pt is a know heavy user for body building. Appreciate all help with this patient. Will discuss with Dr Jon Billings and mak a plan. Hopefully we can treat without surgery. Fredderick Severance PA to see Pt this am.

## 2016-11-20 NOTE — Plan of Care (Signed)
Problem: Safety  Goal: Patient will be free from injury during hospitalization  Outcome: Progressing   11/20/16 1700   Goal/Interventions addressed this shift   Patient will be free from injury during hospitalization  Assess patient's risk for falls and implement fall prevention plan of care per policy;Provide and maintain safe environment;Use appropriate transfer methods;Ensure appropriate safety devices are available at the bedside;Include patient/ family/ care giver in decisions related to safety;Hourly rounding;Assess for patients risk for elopement and implement Elopement Risk Plan per policy;Provide alternative method of communication if needed (communication boards, writing)       Problem: Infection  Goal: Free from infection  Outcome: Progressing   11/20/16 1700   OTHER   Free from infection  Assess for signs/symptoms of infection;Utilize isolation precautions per protocol/policy;Assess immunization status;Utilize sepsis protocol       Comments: Patient is AO X4.   Vital sign stable, afebrile.   Right  leg swollen and red. Blister in the right leg.    Pain increases with movement.   Patient voiding clear yellow urine with urinal and bathroom, had 1 BM.   Pain controls with diladid 8mg  every 3 hours and diladid 2mg  every 2 hour.   Zosyn and vancomycin antibiotics given.   Will continue to monitor.

## 2016-11-21 LAB — CBC AND DIFFERENTIAL
Absolute NRBC: 0 10*3/uL
Basophils Absolute Automated: 0.07 10*3/uL (ref 0.00–0.20)
Basophils Automated: 0.4 %
Eosinophils Absolute Automated: 0.41 10*3/uL (ref 0.00–0.70)
Eosinophils Automated: 2.6 %
Hematocrit: 33.4 % — ABNORMAL LOW (ref 42.0–52.0)
Hgb: 10.8 g/dL — ABNORMAL LOW (ref 13.0–17.0)
Immature Granulocytes Absolute: 0.2 10*3/uL — ABNORMAL HIGH
Immature Granulocytes: 1.3 %
Lymphocytes Absolute Automated: 2.74 10*3/uL (ref 0.50–4.40)
Lymphocytes Automated: 17.6 %
MCH: 31.3 pg (ref 28.0–32.0)
MCHC: 32.3 g/dL (ref 32.0–36.0)
MCV: 96.8 fL (ref 80.0–100.0)
MPV: 9.8 fL (ref 9.4–12.3)
Monocytes Absolute Automated: 1.83 10*3/uL — ABNORMAL HIGH (ref 0.00–1.20)
Monocytes: 11.8 %
Neutrophils Absolute: 10.32 10*3/uL — ABNORMAL HIGH (ref 1.80–8.10)
Neutrophils: 66.3 %
Nucleated RBC: 0 /100 WBC (ref 0.0–1.0)
Platelets: 507 10*3/uL — ABNORMAL HIGH (ref 140–400)
RBC: 3.45 10*6/uL — ABNORMAL LOW (ref 4.70–6.00)
RDW: 17 % — ABNORMAL HIGH (ref 12–15)
WBC: 15.57 10*3/uL — ABNORMAL HIGH (ref 3.50–10.80)
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
## 2016-11-21 NOTE — Plan of Care (Signed)
Problem: Safety  Goal: Patient will be free from injury during hospitalization  Outcome: Progressing   11/21/16 0118   Goal/Interventions addressed this shift   Patient will be free from injury during hospitalization  Assess patient's risk for falls and implement fall prevention plan of care per policy;Provide and maintain safe environment;Use appropriate transfer methods;Ensure appropriate safety devices are available at the bedside;Hourly rounding       Problem: Pain  Goal: Pain at adequate level as identified by patient  Outcome: Progressing   11/21/16 0118   Goal/Interventions addressed this shift   Pain at adequate level as identified by patient Identify patient comfort function goal;Assess for risk of opioid induced respiratory depression, including snoring/sleep apnea. Alert healthcare team of risk factors identified.;Assess pain on admission, during daily assessment and/or before any "as needed" intervention(s);Reassess pain within 30-60 minutes of any procedure/intervention, per Pain Assessment, Intervention, Reassessment (AIR) Cycle;Offer non-pharmacological pain management interventions       Problem: Psychosocial and Spiritual Needs  Goal: Demonstrates ability to cope with hospitalization/illness  Outcome: Progressing   11/21/16 0118   Goal/Interventions addressed this shift   Demonstrates ability to cope with hospitalizations/illness Encourage verbalization of feelings/concerns/expectations;Assist patient to identify own strengths and abilities;Encourage participation in diversional activity;Provide quiet environment;Encourage patient to set small goals for self       Problem: Infection  Goal: Free from infection  Outcome: Progressing   11/21/16 0118   OTHER   Free from infection  Assess for signs/symptoms of infection       Problem: Impaired Mobility  Goal: Mobility/Activity is maintained at optimal level for patient  Outcome: Progressing   11/21/16 0118   Goal/Interventions addressed this shift    Mobility/activity is maintained at optimal level for patient Increase mobility as tolerated/progressive mobility;Encourage independent activity per ability;Maintain proper body alignment;Plan activities to conserve energy, plan rest periods       Problem: Peripheral Neurovascular Impairment  Goal: Extremity color, movement, sensation are maintained or improved  Outcome: Progressing   11/21/16 0118   Goal/Interventions addressed this shift   Extremity color, movement, sensation are maintained or improved  Increase mobility as tolerated/progressive mobility;Assess and monitor application of corrective devices (cast, brace, splint), check skin integrity;Assess extremity for proper alignment;Teach/review/reinforce ankle pump exercises       Comments: Assumed care of pt alert and oriented, lying in bed.   Swelling and redness RLE, blister @ L knee noted.  Ambulatory with aide of walker, minimal assist.  continuously complaining of severe pain 7-8/10, explained to pt about IV/PO pain regiment.   Pt wife insist IV should be given after getting PO dilaudid, explained to them, IV will be given if pt had breakthrough pain.  Needs attended, will continue to monitor.
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
## 2016-11-21 NOTE — Progress Notes (Signed)
Pt afebrile, WBC still 15, Rubor still present from knee to ankle and some medial thigh, Blister medial leg resolving, less tenderness in leg and knee but still significant tenderness distal leg and some in proximal thigh, much less tenderness than earlier but still a concern, may have to consider washout and deep cultures. Will discuss with ID today, Pt aware, Pt admits to self injection of steroids on the wed 2 days after total knee arthroplasty, more comfortable today but still needs regular narcotics. Appreciate all help.

## 2016-11-21 NOTE — Progress Notes (Signed)
Patient is AO X4.   Vital sign stable, afebrile.   Zosyn and Vancomycin antibiotics.   Right knee dressing changed with gauze and tegaderm, staples intact. C/D/I. Patient is planning to do wash-out I&D tomorrow, NPO midnight today for surgery. Patient and wife aware. Pain is 8/10; medication given Diladid 8mg  Q3hours po, Diladid 2mg  IV Q2hours. Miralax given for the bowel regimen, patient feels hard when in the toilet, LBM yesterday, voiding great amount of urine, denies dysuria. Will continue to monitor.
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
## 2016-11-21 NOTE — Progress Notes (Signed)
Patient without significant complaints. Pain reasonably well controlled and improved from yesterday. Patient taking PO and IV Dilaudid for pain control. Gabapentin also added yesterday after discussion with Flo, pain management NP. Continued IV antibiotics for cellulitis per infectious disease. Afebrile today. WBC only decreased slightly since yesterday. Still at 15.57 this AM. Spoke with Dr. Maurice March, plan will be to do an I&D and cultures of the right knee tomorrow afternoon. Tolerating PO without N/V. Ambulating only minimally with severe pain of the RLE.     Vitals:    11/21/16 0706   BP: 144/55   Pulse: 85   Resp: 19   Temp: 99 F (37.2 C)   SpO2: 96%     No intake or output data in the 24 hours ending 11/21/16 0909    Exam:    Awake and alert.  Oriented X 3    Dressing clean, dry and intact. Dressing changed today. RLE erythema, warmth and edema persists with minimal improvement. This area remains very tender to palpation. Full passive and active ROM of his right toes. Distal pulses intact.     Calves soft, non tender.  Negative Homans.      Recent Labs  Lab 11/21/16  0424   WBC 15.57*   Hgb 10.8*   Hematocrit 33.4*         Recent Labs  Lab 11/18/16  0816   Sodium 135*   Potassium 4.0   Chloride 98*   CO2 28   BUN 18.0   Creatinine 1.1   Glucose 99   Calcium 8.8           Assessment:    56 y/o male 8 days s/p R TKR presents with RLE cellulitis and worsening pain    Plan:    1. Continue to monitor pain level.  2. Dressing changed today.  3. Continue to mobilize as tolerated.   4. Lovenox discontinued in prep for surgery tomorrow.  5. Blood cultures still show no growth after 48 hours. Still pending final results.  6. Patient to be evaluated by infectious disease later today. Will update them on the plan.   7. Patient will be scheduled for an I&D and cultures of the right knee tomorrow. NPO ordered for after midnight. Only sips of water with taking medication. Patient will then be evaluated post-op.

## 2016-11-22 ENCOUNTER — Inpatient Hospital Stay: Payer: No Typology Code available for payment source | Admitting: Anesthesiology

## 2016-11-22 ENCOUNTER — Encounter
Admission: EM | Disposition: A | Discharge status: Home Health | Payer: Self-pay | Source: Home / Self Care | Attending: Internal Medicine

## 2016-11-22 HISTORY — PX: INCISION & DRAINAGE, WOUND: SHX4269

## 2016-11-22 LAB — HEMOLYSIS INDEX: Hemolysis Index: 21 — ABNORMAL HIGH (ref 0–18)

## 2016-11-22 LAB — GFR: EGFR: >60

## 2016-11-22 LAB — CREATININE, SERUM: Creatinine: 1.2 mg/dL (ref 0.5–1.5)

## 2016-11-22 SURGERY — INCISION AND DRAINAGE, COMPLEX, POST OPERATIVE WOUND INFECTION
Anesthesia: Anesthesia General | Site: Knee | Laterality: Right | Wound class: Clean

## 2016-11-22 MED ORDER — OXYCODONE-ACETAMINOPHEN 5-325 MG PO TABS
ORAL_TABLET | ORAL | Status: AC
Start: 2016-11-22 — End: 2016-11-22
  Administered 2016-11-22: 20:00:00 1 via ORAL
  Filled 2016-11-22: qty 1

## 2016-11-22 MED ORDER — HYDROMORPHONE HCL 1 MG/ML IJ SOLN
INTRAMUSCULAR | Status: AC
Start: 2016-11-22 — End: 2016-11-22
  Administered 2016-11-22: 16:00:00 0.5 mg via INTRAVENOUS
  Filled 2016-11-22: qty 1

## 2016-11-22 MED ORDER — GLYCOPYRROLATE 0.2 MG/ML IJ SOLN
INTRAMUSCULAR | Status: AC
Start: 2016-11-22 — End: ?
  Filled 2016-11-22: qty 1

## 2016-11-22 MED ORDER — LIDOCAINE HCL (PF) 2 % IJ SOLN
INTRAMUSCULAR | Status: AC
Start: 2016-11-22 — End: ?
  Filled 2016-11-22: qty 5

## 2016-11-22 MED ORDER — SODIUM CHLORIDE 0.9% BAG (IRRIGATION USE)
INTRAVENOUS | Status: DC | PRN
Start: 2016-11-22 — End: 2016-11-22
  Administered 2016-11-22: 3000 mL

## 2016-11-22 MED ORDER — GLYCOPYRROLATE 0.2 MG/ML IJ SOLN
INTRAMUSCULAR | Status: DC | PRN
Start: 2016-11-22 — End: 2016-11-22
  Administered 2016-11-22: .2 mg via INTRAVENOUS

## 2016-11-22 MED ORDER — FAMOTIDINE 20 MG/2ML IV SOLN
INTRAVENOUS | Status: AC
Start: 2016-11-22 — End: ?
  Filled 2016-11-22: qty 2

## 2016-11-22 MED ORDER — OXYCODONE-ACETAMINOPHEN 5-325 MG PO TABS
1.0000 | ORAL_TABLET | Freq: Once | ORAL | Status: AC | PRN
Start: 2016-11-22 — End: 2016-11-22

## 2016-11-22 MED ORDER — NALOXONE HCL 0.4 MG/ML IJ SOLN (WRAP)
0.1000 mg | INTRAMUSCULAR | Status: AC | PRN
Start: 2016-11-22 — End: 2016-11-23

## 2016-11-22 MED ORDER — LACTATED RINGERS IV SOLN
INTRAVENOUS | Status: DC | PRN
Start: 2016-11-22 — End: 2016-11-22

## 2016-11-22 MED ORDER — HYDROMORPHONE HCL 1 MG/ML IJ SOLN
INTRAMUSCULAR | Status: AC
Start: 2016-11-22 — End: 2016-11-22
  Administered 2016-11-22: 20:00:00 1 mg via INTRAVENOUS
  Filled 2016-11-22: qty 2

## 2016-11-22 MED ORDER — HYDROMORPHONE HCL-NACL 20-0.9 MG/100ML-% IV SOLN
INTRAVENOUS | Status: DC
Start: 2016-11-22 — End: 2016-11-23
  Administered 2016-11-22: 23:00:00 20 mg via INTRAVENOUS
  Filled 2016-11-22: qty 100

## 2016-11-22 MED ORDER — ONDANSETRON HCL 4 MG/2ML IJ SOLN
4.0000 mg | Freq: Once | INTRAMUSCULAR | Status: DC | PRN
Start: 2016-11-22 — End: 2016-11-22

## 2016-11-22 MED ORDER — FENTANYL CITRATE (PF) 50 MCG/ML IJ SOLN (WRAP)
25.0000 ug | INTRAMUSCULAR | Status: AC | PRN
Start: 2016-11-22 — End: 2016-11-22
  Administered 2016-11-22 (×3): 25 ug via INTRAVENOUS

## 2016-11-22 MED ORDER — MIDAZOLAM HCL 2 MG/2ML IJ SOLN
INTRAMUSCULAR | Status: AC
Start: 2016-11-22 — End: ?
  Filled 2016-11-22: qty 2

## 2016-11-22 MED ORDER — PROPOFOL INFUSION 10 MG/ML
INTRAVENOUS | Status: DC | PRN
Start: 2016-11-22 — End: 2016-11-22
  Administered 2016-11-22: 60 mg via INTRAVENOUS
  Administered 2016-11-22: 250 mg via INTRAVENOUS
  Administered 2016-11-22: 50 mg via INTRAVENOUS

## 2016-11-22 MED ORDER — FENTANYL CITRATE (PF) 50 MCG/ML IJ SOLN (WRAP)
INTRAMUSCULAR | Status: DC | PRN
Start: 2016-11-22 — End: 2016-11-22
  Administered 2016-11-22: 100 ug via INTRAVENOUS

## 2016-11-22 MED ORDER — PHENYLEPHRINE 100 MCG/ML IN NACL 0.9% IV SOSY
PREFILLED_SYRINGE | INTRAVENOUS | Status: AC
Start: 2016-11-22 — End: ?
  Filled 2016-11-22: qty 5

## 2016-11-22 MED ORDER — PROPOFOL 10 MG/ML IV EMUL (WRAP)
INTRAVENOUS | Status: AC
Start: 2016-11-22 — End: ?
  Filled 2016-11-22: qty 20

## 2016-11-22 MED ORDER — MEPERIDINE HCL 25 MG/ML IJ SOLN
INTRAMUSCULAR | Status: AC
Start: 2016-11-22 — End: 2016-11-22
  Administered 2016-11-22: 19:00:00 25 mg via INTRAVENOUS
  Filled 2016-11-22: qty 1

## 2016-11-22 MED ORDER — FENTANYL CITRATE (PF) 50 MCG/ML IJ SOLN (WRAP)
INTRAMUSCULAR | Status: AC
Start: 2016-11-22 — End: 2016-11-22
  Administered 2016-11-22: 19:00:00 25 ug via INTRAVENOUS
  Filled 2016-11-22: qty 2

## 2016-11-22 MED ORDER — HYDROMORPHONE HCL 1 MG/ML IJ SOLN
INTRAMUSCULAR | Status: AC
Start: 2016-11-22 — End: ?
  Filled 2016-11-22: qty 1

## 2016-11-22 MED ORDER — FAMOTIDINE 10 MG/ML IV SOLN (WRAP)
INTRAVENOUS | Status: DC | PRN
Start: 2016-11-22 — End: 2016-11-22
  Administered 2016-11-22: 20 mg via INTRAVENOUS

## 2016-11-22 MED ORDER — DIPHENHYDRAMINE HCL 25 MG PO CAPS
25.0000 mg | ORAL_CAPSULE | Freq: Four times a day (QID) | ORAL | Status: AC | PRN
Start: 2016-11-22 — End: 2016-11-23

## 2016-11-22 MED ORDER — MIDAZOLAM HCL 2 MG/2ML IJ SOLN
INTRAMUSCULAR | Status: DC | PRN
Start: 2016-11-22 — End: 2016-11-22
  Administered 2016-11-22: 2 mg via INTRAVENOUS

## 2016-11-22 MED ORDER — HYDROMORPHONE HCL 1 MG/ML IJ SOLN
1.0000 mg | INTRAMUSCULAR | Status: DC | PRN
Start: 2016-11-22 — End: 2016-11-22
  Administered 2016-11-22: 1 mg via INTRAVENOUS

## 2016-11-22 MED ORDER — ONDANSETRON HCL 4 MG/2ML IJ SOLN
4.0000 mg | Freq: Once | INTRAMUSCULAR | Status: AC | PRN
Start: 2016-11-22 — End: 2016-11-22

## 2016-11-22 MED ORDER — ASPIRIN 325 MG PO TBEC
325.0000 mg | DELAYED_RELEASE_TABLET | Freq: Two times a day (BID) | ORAL | Status: DC
Start: 2016-11-22 — End: 2016-11-27
  Administered 2016-11-23 – 2016-11-27 (×10): 325 mg via ORAL
  Filled 2016-11-22 (×10): qty 1

## 2016-11-22 MED ORDER — HYDROMORPHONE HCL 1 MG/ML IJ SOLN
INTRAMUSCULAR | Status: DC | PRN
Start: 2016-11-22 — End: 2016-11-22
  Administered 2016-11-22: .5 mg via INTRAVENOUS
  Administered 2016-11-22 (×2): 0.5 mg via INTRAVENOUS
  Administered 2016-11-22: .5 mg via INTRAVENOUS

## 2016-11-22 MED ORDER — PROMETHAZINE HCL 25 MG/ML IJ SOLN
6.2500 mg | Freq: Once | INTRAMUSCULAR | Status: AC | PRN
Start: 2016-11-22 — End: 2016-11-22

## 2016-11-22 MED ORDER — HYDROMORPHONE HCL 1 MG/ML IJ SOLN
0.5000 mg | Freq: Once | INTRAMUSCULAR | Status: AC
Start: 2016-11-22 — End: 2016-11-22

## 2016-11-22 MED ORDER — MEPERIDINE HCL 25 MG/ML IJ SOLN
25.0000 mg | Freq: Once | INTRAMUSCULAR | Status: AC | PRN
Start: 2016-11-22 — End: 2016-11-22

## 2016-11-22 MED ORDER — LIDOCAINE HCL 2 % IJ SOLN
INTRAMUSCULAR | Status: DC | PRN
Start: 2016-11-22 — End: 2016-11-22
  Administered 2016-11-22: 100 mg via INTRAVENOUS

## 2016-11-22 MED ORDER — PROMETHAZINE HCL 25 MG/ML IJ SOLN
6.2500 mg | Freq: Once | INTRAMUSCULAR | Status: DC | PRN
Start: 2016-11-22 — End: 2016-11-22

## 2016-11-22 SURGICAL SUPPLY — 9 items
BANDAGE CMPR PLSTR CTTN MED MTRX 5YDX6IN (Bandage) ×1
BANDAGE ELASTIC L5 YD X W6 IN W/SELF-CLOSURE HOOK (Bandage) ×1 IMPLANT
BANDAGE MEDLINE MEDIUM COMPRESSION L5 YD (Bandage) ×1
GAUZE SPONGE VERSLN 4PLY 4X4IN (Dressing) ×2 IMPLANT
PAD ABD DERMACEA 9X5IN LF STRL 4 SL EDG (Dressing) ×2
PAD ABDOMINAL L9 IN X W5 IN 4 SEAL EDGE (Dressing) ×2 IMPLANT
PADDING CAST L4 YD X W6 IN UNDERCAST (Cast) ×1
PADDING CAST L4 YD X W6 IN UNDERCAST MILD STRETCH COHESIVE WEBRIL (Cast) ×1 IMPLANT
PADDING CST CTTN WBRL 4YDX6IN LF STRL (Cast) ×1

## 2016-11-22 NOTE — Anesthesia Postprocedure Evaluation (Signed)
Anesthesia Post Evaluation    Patient: [name]    Procedure(s) with comments:  RIGHT KNEE I&D/ DEEP CULTURE/ EVACUATION OF HEMATOMA - RIGHT KNEE I&D    Anesthesia type: General LMA    Last Vitals:   Vitals:    11/22/16 1930   BP: 174/72   Pulse: 96   Resp: 18   Temp:    SpO2: 98%       Patient Location: Phase I PACU      Post Pain: Patient not complaining of pain, continue current therapy    Mental Status: awake and alert    Respiratory Function: tolerating nasal cannula    Cardiovascular: stable    Nausea/Vomiting: patient not complaining of nausea or vomiting    Hydration Status: adequate    Post Assessment: no apparent anesthetic complications and no reportable events          Anesthesia Qualified Clinical Data Registry 2018    PACU Reintubation  Did the Patient have general anesthesia with intubation: No        PONV Adult  Is the patient aged 55 or older: Yes  Did the patient receive recieve a general anesthestic: Yes  Does the patient have 3 or more risk factors for PONV? No        PONV Pediatric  Is the patient aged 71-17? No            PACU Transfer Checklist Protocol  Was the patient transferred to the PACU at the conclusion of surgery? Yes  Was a checklist or transfer protocol used? Yes    ICU Transfer Checklist Protocol  Was the patient transferred to the ICU at the conclusion of surgery? No      Post-op Pain Assessment Prior to Anesthesia Care End  Age >=18 and assessed for pain in PACU: Yes  Pacu pain score <7/10: Yes      Perioperative Mortality  Perioperative mortality prior to Anesthesia end time: No    Perioperative Cardiac Arrest  Did the patient have an unanticipated intraoperative cardiac arrest between anesthesia start time and anesthesia end time? No    Unplanned Admission to ICU  Did the patient have an unplanned admission to the ICU (not initially anticipated at anesthesia start time)? No      Signed by: Leanne Chang, 11/22/2016 7:46 PM

## 2016-11-22 NOTE — Transfer of Care (Signed)
Anesthesia Transfer of Care Note    Patient: [name]    Procedures performed: Procedure(s) with comments:  RIGHT KNEE I&D/ DEEP CULTURE/ EVACUATION OF HEMATOMA - RIGHT KNEE I&D    Anesthesia type: General LMA    Patient location:PACU    Last vitals:   Vitals:    11/22/16 1912   BP: 168/63   Pulse: 94   Resp: 21   Temp:    SpO2: 95%       Post pain: Patient not complaining of pain, continue current therapy      Mental Status:awake and alert     Respiratory Function: tolerating room air    Cardiovascular: stable    Nausea/Vomiting: patient not complaining of nausea or vomiting    Hydration Status: adequate    Post assessment: no apparent anesthetic complications, no reportable events and no evidence of recall    Signed by: Betsy Pries  11/22/16 7:12 PM

## 2016-11-22 NOTE — Plan of Care (Signed)
Problem: Safety  Goal: Patient will be free from injury during hospitalization  Outcome: Progressing    Goal: Patient will be free from infection during hospitalization  Outcome: Progressing      Problem: Pain  Goal: Pain at adequate level as identified by patient  Outcome: Progressing      Problem: Side Effects from Pain Analgesia  Goal: Patient will experience minimal side effects of analgesic therapy  Outcome: Progressing      Problem: Discharge Barriers  Goal: Patient will be discharged home or other facility with appropriate resources  Outcome: Progressing      Problem: Psychosocial and Spiritual Needs  Goal: Demonstrates ability to cope with hospitalization/illness  Outcome: Progressing      Problem: Moderate/High Fall Risk Score >5  Goal: Patient will remain free of falls  Outcome: Progressing      Problem: Infection  Goal: Free from infection  Outcome: Progressing      Problem: Compromised skin integrity  Goal: Skin integrity is maintained or improved  Outcome: Progressing      Problem: Impaired Mobility  Goal: Mobility/Activity is maintained at optimal level for patient  Outcome: Progressing      Problem: Peripheral Neurovascular Impairment  Goal: Extremity color, movement, sensation are maintained or improved  Outcome: Progressing
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
## 2016-11-22 NOTE — Progress Notes (Signed)
Pt to continue NPO today , for washout and deep cultures today.

## 2016-11-22 NOTE — Plan of Care (Signed)
Problem: Safety  Goal: Patient will be free from injury during hospitalization  Outcome: Progressing   11/21/16 0118   Goal/Interventions addressed this shift   Patient will be free from injury during hospitalization  Assess patient's risk for falls and implement fall prevention plan of care per policy;Provide and maintain safe environment;Use appropriate transfer methods;Ensure appropriate safety devices are available at the bedside;Hourly rounding     Goal: Patient will be free from infection during hospitalization  Outcome: Progressing   11/19/16 0807   Goal/Interventions addressed this shift   Free from Infection during hospitalization Assess and monitor for signs and symptoms of infection;Monitor lab/diagnostic results       Problem: Pain  Goal: Pain at adequate level as identified by patient  Outcome: Progressing   11/21/16 0118   Goal/Interventions addressed this shift   Pain at adequate level as identified by patient Identify patient comfort function goal;Assess for risk of opioid induced respiratory depression, including snoring/sleep apnea. Alert healthcare team of risk factors identified.;Assess pain on admission, during daily assessment and/or before any "as needed" intervention(s);Reassess pain within 30-60 minutes of any procedure/intervention, per Pain Assessment, Intervention, Reassessment (AIR) Cycle;Offer non-pharmacological pain management interventions     Pt c/o R leg pain, request for PRN IV and PO dilaudid ATC. Pt states that he wants to keep on top of his pain, if he misses PRN pain mediation than its hard for him to get his pain back under control.      Problem: Side Effects from Pain Analgesia  Goal: Patient will experience minimal side effects of analgesic therapy  Outcome: Progressing   11/20/16 0509   Goal/Interventions addressed this shift   Patient will experience minimal side effects of analgesic therapy Monitor/assess patient's respiratory status (RR depth, effort, breath  sounds);Assess for changes in cognitive function       Problem: Discharge Barriers  Goal: Patient will be discharged home or other facility with appropriate resources  Outcome: Progressing   11/19/16 0807   Goal/Interventions addressed this shift   Discharge to home or other facility with appropriate resources Provide appropriate patient education;Provide information on available health resources;Initiate discharge planning       Problem: Psychosocial and Spiritual Needs  Goal: Demonstrates ability to cope with hospitalization/illness  Outcome: Progressing   11/21/16 0118   Goal/Interventions addressed this shift   Demonstrates ability to cope with hospitalizations/illness Encourage verbalization of feelings/concerns/expectations;Assist patient to identify own strengths and abilities;Encourage participation in diversional activity;Provide quiet environment;Encourage patient to set small goals for self       Problem: Infection  Goal: Free from infection  Outcome: Progressing   11/21/16 0118   OTHER   Free from infection  Assess for signs/symptoms of infection       Problem: Compromised skin integrity  Goal: Skin integrity is maintained or improved  Outcome: Not Progressing   11/20/16 0509   Goal/Interventions addressed this shift   Skin integrity is maintained or improved Assess Braden Scale every shift       Problem: Impaired Mobility  Goal: Mobility/Activity is maintained at optimal level for patient  Outcome: Progressing   11/21/16 0118   Goal/Interventions addressed this shift   Mobility/activity is maintained at optimal level for patient Increase mobility as tolerated/progressive mobility;Encourage independent activity per ability;Maintain proper body alignment;Plan activities to conserve energy, plan rest periods       Problem: Peripheral Neurovascular Impairment  Goal: Extremity color, movement, sensation are maintained or improved  Outcome: Progressing   11/21/16 0118  Goal/Interventions addressed this shift    Extremity color, movement, sensation are maintained or improved  Increase mobility as tolerated/progressive mobility;Assess and monitor application of corrective devices (cast, brace, splint), check skin integrity;Assess extremity for proper alignment;Teach/review/reinforce ankle pump exercises       Comments: Pt AOX4, VSS, OOB with walker with stand by assistance. Pt refused bed alarm and fall mat. Pt report that he almost trip on the fall mat the other night and does not want to have. Pt prefers to have his bed in high position. Pt states that he is 6'3 and having the bed in low position makes it hard for him to get OOB. Review fall safety plan with pt and wife at bedside to call for assistance.  Dsg to R knee with ACE wrap, c/d/i. Applied ice pack to R knee, which pt report that it helps with his pain. IV ABX given per order. Aware of NPO for I/D later this morning. Pt wants to have his CHG in the morning. Explain to pt the importance of CHG prior to OR procedures. Will continue to monitor.
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
## 2016-11-22 NOTE — Progress Notes (Signed)
See all pain medications given to patient listed on flow sheet. To OR at 1500.
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
## 2016-11-22 NOTE — Anesthesia Preprocedure Evaluation (Signed)
Anesthesia Evaluation    AIRWAY           CARDIOVASCULAR    cardiovascular exam normal       DENTAL    no notable dental hx     PULMONARY    pulmonary exam normal     OTHER FINDINGS              Relevant Problems   No relevant active problems               Anesthesia Plan    ASA 2     general               (HTN  On vanc and zosyn  Asplenic)      intravenous induction   Detailed anesthesia plan: general LMA        Post op pain management: per surgeon    informed consent obtained    Plan discussed with CRNA.      pertinent labs reviewed             Signed by: Leanne Chang 11/22/16 4:54 PM
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
## 2016-11-23 ENCOUNTER — Encounter: Payer: Self-pay | Admitting: Orthopaedic Surgery

## 2016-11-23 LAB — CBC AND DIFFERENTIAL
Absolute NRBC: 0 10*3/uL
Basophils Absolute Automated: 0.06 10*3/uL (ref 0.00–0.20)
Basophils Automated: 0.4 %
Eosinophils Absolute Automated: 0.47 10*3/uL (ref 0.00–0.70)
Eosinophils Automated: 3.3 %
Hematocrit: 34.2 % — ABNORMAL LOW (ref 42.0–52.0)
Hgb: 10.9 g/dL — ABNORMAL LOW (ref 13.0–17.0)
Immature Granulocytes Absolute: 0.18 10*3/uL — ABNORMAL HIGH
Immature Granulocytes: 1.3 %
Lymphocytes Absolute Automated: 2.12 10*3/uL (ref 0.50–4.40)
Lymphocytes Automated: 14.8 %
MCH: 31 pg (ref 28.0–32.0)
MCHC: 31.9 g/dL — ABNORMAL LOW (ref 32.0–36.0)
MCV: 97.2 fL (ref 80.0–100.0)
MPV: 9.7 fL (ref 9.4–12.3)
Monocytes Absolute Automated: 1.72 10*3/uL — ABNORMAL HIGH (ref 0.00–1.20)
Monocytes: 12 %
Neutrophils Absolute: 9.78 10*3/uL — ABNORMAL HIGH (ref 1.80–8.10)
Neutrophils: 68.2 %
Nucleated RBC: 0 /100 WBC (ref 0.0–1.0)
Platelets: 608 10*3/uL — ABNORMAL HIGH (ref 140–400)
RBC: 3.52 10*6/uL — ABNORMAL LOW (ref 4.70–6.00)
RDW: 17 % — ABNORMAL HIGH (ref 12–15)
WBC: 14.33 10*3/uL — ABNORMAL HIGH (ref 3.50–10.80)

## 2016-11-23 LAB — VANCOMYCIN, TROUGH
Vancomycin Trough: 26.7 ug/mL — ABNORMAL HIGH (ref 10.0–20.0)
Vancomycin Trough: 9.5 ug/mL — ABNORMAL LOW (ref 10.0–20.0)

## 2016-11-23 LAB — PT AND APTT
PT INR: 1 (ref 0.9–1.1)
PT: 13.6 s (ref 12.6–15.0)
PTT: 34 s (ref 23–37)

## 2016-11-23 MED ORDER — OXYCODONE-ACETAMINOPHEN 5-325 MG PO TABS
2.0000 | ORAL_TABLET | ORAL | Status: DC | PRN
Start: 2016-11-23 — End: 2016-11-27

## 2016-11-23 MED ORDER — VANCOMYCIN HCL 1000 MG IV SOLR
1750.0000 mg | Freq: Two times a day (BID) | INTRAVENOUS | Status: DC
Start: 2016-11-24 — End: 2016-11-27
  Administered 2016-11-24 – 2016-11-27 (×8): 1750 mg via INTRAVENOUS
  Filled 2016-11-23 (×11): qty 1750

## 2016-11-23 MED ORDER — HYDROMORPHONE HCL 1 MG/ML IJ SOLN
2.0000 mg | INTRAMUSCULAR | Status: AC | PRN
Start: 2016-11-23 — End: 2016-11-25
  Administered 2016-11-23 – 2016-11-25 (×6): 2 mg via INTRAVENOUS
  Filled 2016-11-23 (×6): qty 2

## 2016-11-23 NOTE — Plan of Care (Signed)
Problem: Safety  Goal: Patient will be free from injury during hospitalization  Outcome: Progressing   11/23/16 1833   Goal/Interventions addressed this shift   Patient will be free from injury during hospitalization  Assess patient's risk for falls and implement fall prevention plan of care per policy;Provide and maintain safe environment;Use appropriate transfer methods;Ensure appropriate safety devices are available at the bedside;Hourly rounding   Bed in the lowest position, fall mats at side of bed, bed alarm on, call bell next to pt.    Problem: Pain  Goal: Pain at adequate level as identified by patient  Outcome: Progressing   11/23/16 1833   Goal/Interventions addressed this shift   Pain at adequate level as identified by patient Identify patient comfort function goal;Assess for risk of opioid induced respiratory depression, including snoring/sleep apnea. Alert healthcare team of risk factors identified.;Assess pain on admission, during daily assessment and/or before any "as needed" intervention(s);Reassess pain within 30-60 minutes of any procedure/intervention, per Pain Assessment, Intervention, Reassessment (AIR) Cycle;Evaluate patient's satisfaction with pain management progress   Pt c/o severe 8-10/10 pain in knee. PCA pump was d/c. Pt given interchangeably IN dilaudid and po dilaudid. Pain would go down to the smallest 4/10. Will continue to monitor.     Medicine Progress Note:    Neuro: A&Ox4    CV: WNL. S1, S2    Pulm: Lung sounds clear. Sating 98% on RA    GI/GU: Continent of bowel and urine.    Skin: Surgical wound on right knee. Large scar on abdomen.    *Per pts surgeon, transferred to ortho floor.     Active Lines:  Patient Lines/Drains/Airways Status    Active Lines, Drains and Airways     Name:   Placement date:   Placement time:   Site:   Days:    Peripheral IV 11/18/16 Right Forearm  11/18/16    0800    Forearm    5    Peripheral IV 11/18/16 Left Antecubital  11/18/16    1129    Antecubital     5    Peripheral IV 11/22/16 Left Forearm  11/22/16    1755    Forearm    1                Vitals:    11/23/16 0403 11/23/16 0738 11/23/16 1102 11/23/16 1547   BP: 159/71 133/65 137/57 157/69   Pulse: 94 88  94   Resp: 18 17  17    Temp: 98.6 F (37 C) 99 F (37.2 C)  99.1 F (37.3 C)   TempSrc: Oral Oral  Oral   SpO2: 97% 95%  98%   Weight:       Height:           No intake or output data in the 24 hours ending 11/23/16 1835
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
## 2016-11-23 NOTE — Progress Notes (Signed)
ORTHO [facility] SERVICES: CPM machine applied to right knee, at 0 -35 degrees.Well tolerated by patient. Instructions given to increase flexion 10 deg QD and use CPM 6 hrs QD.    Ortho Tech   [employer]
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
## 2016-11-23 NOTE — Plan of Care (Signed)
Problem: Safety  Goal: Patient will be free from injury during hospitalization  Outcome: Progressing   11/23/16 2218   Goal/Interventions addressed this shift   Patient will be free from injury during hospitalization  Assess patient's risk for falls and implement fall prevention plan of care per policy;Provide and maintain safe environment;Use appropriate transfer methods     Goal: Patient will be free from infection during hospitalization  Outcome: Progressing   11/23/16 2218   Goal/Interventions addressed this shift   Free from Infection during hospitalization Assess and monitor for signs and symptoms of infection       Problem: Pain  Goal: Pain at adequate level as identified by patient  Outcome: Progressing   11/23/16 2218   Goal/Interventions addressed this shift   Pain at adequate level as identified by patient Identify patient comfort function goal;Assess for risk of opioid induced respiratory depression, including snoring/sleep apnea. Alert healthcare team of risk factors identified.       Problem: Side Effects from Pain Analgesia  Goal: Patient will experience minimal side effects of analgesic therapy  Outcome: Progressing   11/23/16 2218   Goal/Interventions addressed this shift   Patient will experience minimal side effects of analgesic therapy Monitor/assess patient's respiratory status (RR depth, effort, breath sounds);Assess for changes in cognitive function       Problem: Discharge Barriers  Goal: Patient will be discharged home or other facility with appropriate resources  Outcome: Progressing      Problem: Psychosocial and Spiritual Needs  Goal: Demonstrates ability to cope with hospitalization/illness  Outcome: Progressing   11/23/16 2218   Goal/Interventions addressed this shift   Demonstrates ability to cope with hospitalizations/illness Encourage verbalization of feelings/concerns/expectations;Provide quiet environment;Assist patient to identify own strengths and abilities       Problem:  Moderate/High Fall Risk Score >5  Goal: Patient will remain free of falls  Outcome: Progressing      Problem: Infection  Goal: Free from infection  Outcome: Progressing      Problem: Compromised skin integrity  Goal: Skin integrity is maintained or improved  Outcome: Progressing      Problem: Impaired Mobility  Goal: Mobility/Activity is maintained at optimal level for patient  Outcome: Progressing   11/23/16 2218   Goal/Interventions addressed this shift   Mobility/activity is maintained at optimal level for patient Increase mobility as tolerated/progressive mobility;Maintain proper body alignment;Encourage independent activity per ability       Problem: Peripheral Neurovascular Impairment  Goal: Extremity color, movement, sensation are maintained or improved  Outcome: Progressing   11/23/16 2218   Goal/Interventions addressed this shift   Extremity color, movement, sensation are maintained or improved  Increase mobility as tolerated/progressive mobility;Assess and monitor application of corrective devices (cast, brace, splint), check skin integrity       Comments: Pt is a/ox3. Pt is aware to call for assistant, call bell within reach, round on pt . Vital signs stable.voiding, dressing intact. Pain under control. Pt can wiggle toes, warm to touch

## 2016-11-23 NOTE — Progress Notes (Signed)
Post OP Day 1 Day Post-Op for Procedure(s):  RIGHT KNEE I&D/ DEEP CULTURE/ EVACUATION OF HEMATOMA     Patient without significant complaints. Pain reasonably well controlled with Dilaudid PCA and PO Dilaudid. Patient notes overall improvement in RLE after surgical procedure yesterday PM. However, incisional pain still persists resulting in difficulty mobilizing Tolerating PO without N/V. Voiding adequately. Minimal ambulation around the room with PT.       Vitals:    11/23/16 1547   BP: 157/69   Pulse: 94   Resp: 17   Temp: 99.1 F (37.3 C)   SpO2: 98%       Intake/Output Summary (Last 24 hours) at 11/23/16 1553  Last data filed at 11/22/16 1815   Gross per 24 hour   Intake             2000 ml   Output              580 ml   Net             1420 ml       Exam:    Awake and alert.  Oriented X 3    Dressing clean, dry and intact. Dressing changed today. No active drainage. Mild erythema and edema still present of RLE. Significant improvement from pre-op appearance. This area remains very tender to palpation. Full passive and active ROM of his right toes. Distal pulses intact.     Calves soft, non tender.  Negative Homans.      Recent Labs  Lab 11/23/16  0409   WBC 14.33*   Hgb 10.9*   Hematocrit 34.2*         Recent Labs  Lab 11/22/16  0348 11/18/16  0816   Sodium  --  135*   Potassium  --  4.0   Chloride  --  98*   CO2  --  28   BUN  --  18.0   Creatinine 1.2 1.1   Glucose  --  99   Calcium  --  8.8           Assessment:    Satisfactory post-op course status post Procedure(s):  RIGHT KNEE I&D/ DEEP CULTURE/ EVACUATION OF HEMATOMA     Plan:    Continue per clinical pathway for Procedure(s):  RIGHT KNEE I&D/ DEEP CULTURE/ EVACUATION OF HEMATOMA     1. Patient stable post op day #1 from wash-out I&D right knee - evacuation of hematoma.  2. PCA discontinued. Dilaudid IV ordered for breakthrough pain.   3. Continue to mobilize as tolerated.  4. Continue recommendations per infectious disease and medicine teams.   5.  Patient to be evaluated tomorrow with possible discharge pending above.
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
## 2016-11-23 NOTE — Progress Notes (Signed)
Afeb, VSS, leg looks better with much less pain, Pt has history of previous bleeding issues as per Hx of family and Pt. Labs ordered, will have PMD check for issues, cultures negative so far, WBC 14, Pt has history of chronic elevation of WBC again by Hx of patient, Will await suggestion form ID, mobilize as tolerated, Appreciate all help, would transfer to [facility] floor ASAP to help with post op total knee care. No signs od compartment syndrome.

## 2016-11-23 NOTE — Progress Notes (Signed)
Vancomycin trough 26.7. Pharmacy notified. Hold next dose per order. Report given to oncoming day nurse.
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
## 2016-11-23 NOTE — Plan of Care (Addendum)
Pt AOX4, VSS. Pt received from PACU s/p right kneed I&D deep culture evacuation of hematoma. Pt reports pain on 10/10 scale, oral dilaudid administred. Pt and wife states that he is suppose to be on PCA. No order. Surgery team notified and PCA order initiated per orders. Right knee and lower leg ace bandaged. Leg warm and swollen. Patient wiggles toes. PRN oral dilaudid given X2. Pt refuses bed alarm and insists that bed remain in high position.   Problem: Safety  Goal: Patient will be free from injury during hospitalization  Outcome: Progressing    Problem: Pain  Goal: Pain at adequate level as identified by patient  Outcome: Progressing      Problem: Infection  Goal: Free from infection  Outcome: Progressing      Problem: Compromised skin integrity  Goal: Skin integrity is maintained or improved  Outcome: Progressing      Problem: Impaired Mobility  Goal: Mobility/Activity is maintained at optimal level for patient  Outcome: Progressing      Problem: Peripheral Neurovascular Impairment  Goal: Extremity color, movement, sensation are maintained or improved  Outcome: Progressing      Problem: Compromised Tissue integrity  Goal: Damaged tissue is healing and protected  Outcome: Progressing
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
## 2016-11-24 LAB — CBC
Absolute NRBC: 0 10*3/uL
Hematocrit: 34.2 % — ABNORMAL LOW (ref 42.0–52.0)
Hgb: 10.9 g/dL — ABNORMAL LOW (ref 13.0–17.0)
MCH: 31 pg (ref 28.0–32.0)
MCHC: 31.9 g/dL — ABNORMAL LOW (ref 32.0–36.0)
MCV: 97.2 fL (ref 80.0–100.0)
MPV: 9.8 fL (ref 9.4–12.3)
Nucleated RBC: 0 /100 WBC (ref 0.0–1.0)
Platelets: 671 10*3/uL — ABNORMAL HIGH (ref 140–400)
RBC: 3.52 10*6/uL — ABNORMAL LOW (ref 4.70–6.00)
RDW: 17 % — ABNORMAL HIGH (ref 12–15)
WBC: 16.24 10*3/uL — ABNORMAL HIGH (ref 3.50–10.80)

## 2016-11-24 MED ORDER — SODIUM CHLORIDE 0.9 % IV MBP
1.0000 g | INTRAVENOUS | Status: DC
Start: 2016-11-24 — End: 2016-11-27
  Administered 2016-11-24 – 2016-11-27 (×4): 1 g via INTRAVENOUS
  Filled 2016-11-24 (×4): qty 1000

## 2016-11-24 MED ORDER — HYDROMORPHONE HCL 4 MG PO TABS
4.0000 mg | ORAL_TABLET | ORAL | 0 refills | Status: DC | PRN
Start: 2016-11-24 — End: 2019-03-27
  Filled 2016-11-27: qty 60, 4d supply, fill #0

## 2016-11-24 MED ORDER — FUROSEMIDE 10 MG/ML IJ SOLN
10.0000 mg | Freq: Once | INTRAMUSCULAR | Status: AC
Start: 2016-11-24 — End: 2016-11-24
  Administered 2016-11-24: 14:00:00 10 mg via INTRAVENOUS
  Filled 2016-11-24: qty 4

## 2016-11-24 NOTE — Discharge Instr - AVS First Page (Addendum)
Reason for your Hospital Admission:  RLE severe pain, cellulitis and edema ---- due to hematoma s/p R TKR      Instructions for after your discharge:                          Post-Operative Instructions for   Total Knee Replacement                      Aretta Nip, III, M.D.                                               [phone]      1.   Elevate the leg (from under the heel and ankle only) and apply an ice pack for 20           minutes every hour as needed to help reduce pain and swelling.    2. Use walker to walk.  You can put as much weight on your leg as you can tolerate. Wean from walker to a cane as tolerated.    3. Change your dressing daily.  Do not get your incision or bandage wet.  No baths/hot tubs/pools for 4 weeks. Do not put any lotions/creams/ointments on incision.    4. You will need to wear compression stockings for 1 month after surgery.    5. You may not drive until evaluated by me in the office and I give you clearance.    6. Resume outpatient physical therapy as soon as possible.    7. You will have DVT pumps and a CPM machine from [facility] for approximately 2 weeks. They will coordinate pick-up and drop-off of these items. If any questions or concerns, you can contact their office at [phone].    8. Take Aspirin 325 mg tablet BID for 2 weeks unless otherwise instructed.     9. Take pain medication as directed for pain relief. Please call the office for prescription refills 1-2 days before you run out. Pain medication will not be filled on weekends.  10.   Pain Medication Tips:  - Do not drive while taking pain medications.  - Do not drink alcoholic beverages while taking pain medications.  - Pain medication should be taken with food as this will help prevent any stomach upset.  - Often pain medications will cause constipation. Eat high fiber foods and increase your fluid intake if possible.   - To alleviate constipation, purchase a stool softener at any pharmacy  and follow the recommended directions on the                             bottle.    11. Make a postoperative appointment with my office within 10-14 days after surgery by calling [phone]. Make appointment with Ly at ext. #[phone].    12. If you develop severe pain, redness, swelling in the leg, difficulty breathing, or fever greater than 101.74F call my office. Fredderick Severance, physician assistant, can be reached at [phone] ext. [phone].    Home Health Discharge Information     Your doctor has ordered Skilled Nursing in-home service(s) for you while you recuperate at home, to assist you in the transition from hospital to home.      The agency that  you or your representative chose to provide the service:  Name of Home Health Agency: Other (comment box) (To [facility] [place] office )]      The Infusion Company:  Name of Infusion Company: [facility], [place] ([id number])]  Infusion: Ertapenem 1 g IV daily Start on: Day after hospital discharge      Name of Infusion Company: [facility] ([id number])]  Infusion: Ertapenem 1 g IV daily Start on: Day of hospital discharge     The above services were set up by:  Marigene Ehlers  (Home Health Liaison)    IF YOU HAVE NOT HEARD FROM YOUR HOME YOUR HOME HEALTH AGENCY WITHIN 24-48 HOURS AFTER DISCHARGE PLEASE CALL YOUR AGENCY TO ARRANGE A TIME FOR YOUR FIRST VISIT. FOR ANY SCHEDULING CONCERNS OR QUESTIONS RELATED TO HOME HEALTH, SUCH AS TIME OR DATE PLEASE CONTACT YOUR HOME HEALTH AGENCY AT THE NUMBER LISTED ABOVE.
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
## 2016-11-24 NOTE — OT Progress Note (Signed)
[facility]   Occupational Therapy Treatment     Patient: [name]    MRN#: [id number]   Unit: [facility] TOWER 8  Bed: V409/W119.14      Discharge Recommendations:   Discharge Recommendation: Home with supervision   DME Recommended for Discharge: Shower chair, Reacher, Long-handled sponge    If Home with supervision recommended discharge disposition is not available, patient will need Min assist for LB dressing    Assessment:   Pt received sitting in bedside chair with spouse at bedside. Pt practiced functional mobility, functional transfers, sink-side grooming, and was educated on the importance of breaking up prolonged (30+min) periods of sitting with light activity (walking to door and back). Pt is progressing well towards goals and may benefit from a follow-up session to review AE for lower body dressing. Pt is appropriate to D/C  Home with assist from spouse PRN.    Assessment  Assessment: decreased ROM;balance deficits;decreased independence with IADLs;decreased independence with ADLs  Prognosis: Good;With family  Progress: Progressing toward goals  Patient left without needs and call bell within reach. RN notified of session outcome.  Patient Goal  Patient Goal: increase R knee ROM    Treatment Activities: ADL retraining, functional transfer training, ed. Risks of prolonged sitting    Educated the patient to role of occupational therapy, plan of care, goals of therapy and safety with mobility and ADLs, energy conservation techniques, home safety.    Plan:   OT Frequency Recommended: follow-up visit only        OT Plan  Risks/Benefits/POC Discussed with Pt/Family: With patient/family  Treatment Interventions: ADL retraining, Functional transfer training, UE strengthening/ROM, Endurance training, Patient/Family training, Equipment eval/education, Neuro muscular reeducation, Compensatory technique education  Discharge Recommendation: Home with supervision  DME Recommended  for Discharge: Shower chair, Reacher, Long-handled sponge  OT Frequency Recommended: follow-up visit only  OT - Next Visit Recommendation: 11/25/16    Continue plan of care.       Precautions and Contraindications:   Precautions  Weight Bearing Status: no restrictions  Other Precautions: reddening on R heel    Updated Medical Status/Imaging/Labs:  Pt with discoloration on R heel with significant swelling throughout RLE MD aware          Subjective:   Patient's medical condition is appropriate for Occupational Therapy intervention at this time.  Patient is agreeable to participation in the therapy session. Nursing clears patient for therapy.    Pain Assessment  Pain Assessment: Numeric Scale (0-10)  Pain Score: 4-moderate pain  POSS Score: Awake and Alert  Pain Location: Knee  Pain Orientation: Right  Pain Descriptors: Discomfort  Pain Frequency: Increases with movement  Effect of Pain on Daily Activities: mild  Patient's Stated Comfort Functional Goal: 3-mild pain  Pain Intervention(s): Repositioned;Elevated   .    Patient Goal: increase R knee ROM  Patient's medical condition is appropriate for Occupational Therapy intervention at this time.  Patient is agreeable to participation in the therapy session.      Objective:   Observation of Patient/Vital Signs:  Patient is seated in a bedside chair with dressings and peripheral IV in place.    Cognition/Neuro Status  Arousal/Alertness: Appropriate responses to stimuli  Attention Span: Appears intact  Orientation Level: Oriented X4  Memory: Appears intact  Following Commands: Follows all commands and directions without difficulty  Safety Awareness: independent  Insights: Fully aware of deficits  Problem Solving: Able to problem solve independently  Behavior: calm;cooperative  Motor Planning: intact  Coordination: intact    Functional Mobility  Rolling: Independent  Sit to Stand Transfers: Modified Independent  Stand to Sit Transfers: Modified Independent  Bed to Toilet  Transfers: Modified Independent  Chair Transfers: Modified Independent  Toilet Transfers: Modified Independent  Functional Mobility/Ambulation: Modified Independent    Self-care and Home Management  Eating: Independent  Grooming: Modified Independent;wash/dry hands;standing at sink  Bathing: Minimal Assist  UB Dressing: Independent  LB Dressing: Minimal Assist;Thread LLE into pants;Thread RLE into pants  Toileting: Modified Independent;grab bar use  Functional Transfers: Modified Independent  Additional Comments: Will have assist at home from spouse                        PMP Activity: Step 6 - Walks in Room               Participation: excellent  Endurance: endurance does not limit participation       Patient left with call bell within reach, all needs met, SCDs not in place when received and left off, fall mat in place, chair alarm not in place when received in chair and all questions answered. RN notified of session outcome and patient response.       Goals:    Time For Goal Achievement: 5 visits  ADL Goals  Patient will groom self: Modified Independent, at sinkside, Goal met  Patient will dress lower body: Modified Independent  Pt will complete bathing: Modified Independent  Patient will toilet: Modified Independent, Goal met  Mobility and Transfer Goals  Pt will perform functional transfers: Modified Independent, Goal met  Neuro Re-Ed Goals  Pt will perform dynamic standing balance: Modified Independent, Goal met                      Dorise Bullion, MS, Raynelle Fanning   Pager# [phone] 11/24/2016      Time of Treatment  OT Received On: 11/24/16  Start Time: 1513  Stop Time: 1555  Time Calculation (min): 42 min

## 2016-11-24 NOTE — Progress Notes (Addendum)
Post OP Day 2 Days Post-Op for Procedure(s):  RIGHT KNEE I&D/ DEEP CULTURE/ EVACUATION OF HEMATOMA     Patient without significant complaints. Pain reasonably well controlled with PO Dilaudid and occasionally IV Dilaudid. However, RLE pain improving and patient is continuing to work on avoiding the IV medication. Tolerating PO without N/V. Voiding adequately. Ambulating successfully with PT (including stairs) and using CPM without any issues. Medicine provider saw patient today and ordered IV Lasix to help with persistent edema. Medicine team does not feel further bleeding work-up is necessary at this time. All blood cultures were negative and intra-op cultures are pending, but so far normal.     Vitals:    11/24/16 1116   BP: 120/55   Pulse: 81   Resp: 18   Temp: 98 F (36.7 C)   SpO2:      No intake or output data in the 24 hours ending 11/24/16 1505    Exam:    Awake and alert.  Oriented X 3    Dressing clean, dry and intact. Dressing changed today. No active drainage. Mild erythema and edema still present of RLE. Even further improvement from yesterday. This area is non-tender to palpation today. Full passive and active ROM of his right toes. Right knee flexion to 35 degrees with mild pain elicited. Distal pulses intact.     Calves soft, non tender.  Negative Homans.      Recent Labs  Lab 11/23/16  0409   WBC 14.33*   Hgb 10.9*   Hematocrit 34.2*         Recent Labs  Lab 11/22/16  0348 11/18/16  0816   Sodium  --  135*   Potassium  --  4.0   Chloride  --  98*   CO2  --  28   BUN  --  18.0   Creatinine 1.2 1.1   Glucose  --  99   Calcium  --  8.8           Assessment:    Satisfactory post-op course status post Procedure(s):  RIGHT KNEE I&D/ DEEP CULTURE/ EVACUATION OF HEMATOMA     Plan:    Continue per clinical pathway for Procedure(s):  RIGHT KNEE I&D/ DEEP CULTURE/ EVACUATION OF HEMATOMA     1. Patient stable post op day #2 from wash-out I&D right knee - evacuation of hematoma.  2. Continue to monitor pain  level. Patient understands to attempt to avoid IV Dilaudid.   3. Continue to mobilize as tolerated with PT. Continue to use CPM as tolerated.   4. Will contact infectious disease regarding IV abx and plan for home.   5. Dressing changed today.   6. Patient is safe and medically cleared for discharge tomorrow AM. All post-op and follow-up instructions discussed. Dilaudid RX placed in chart.     D/C HOME WITH SUPERVISION - patient has outpatient PT set-up for next week and has walker
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
## 2016-11-24 NOTE — PT Progress Note (Signed)
Physical Therapy Note    [facility]   Physical Therapy Treatment  Patient:  [name] MRN#:  [id number]  Unit: [facility] TOWER 8  Bed: H086/V784.69    Discharge Recommendations:   D/C Recommendations: Home with supervision, Home with home health PT   DME Recommendations: Other (Comment) (dme in place)          Assessment:   Pt seen for pm session by PT by am PT request 2/2 poor AAROM R knee. Pt's AAROM has improved but he does have quite a bit of edema from cellulitus infection.  Pt educated on importance of therex/hep and use of CPM. Pt stated already used 4 hrs and will do 2 more tonight. Continue with POC.   Wife again educated that she is suppose to have yellow gown on in isolation room, was non compliant with PT this am.   Assessment: Decreased LE ROM, Decreased LE strength, Decreased endurance/activity tolerance, Decreased safety/judgement during functional mobility, Decreased functional mobility, Decreased balance, Gait impairment  Progress: Progressing toward goals  Prognosis: Good  Risks/Benefits/POC Discussed with Pt/Family: With patient/family  Patient left without needs and call bell within reach. RN notified of session outcome.     Treatment Activities: AAROM, neuromm reeducation. Pt education/training, [place] planning.    Educated the patient to role of physical therapy, plan of care, goals of therapy and HEP, safety with mobility and ADLs, weight bearing precautions, discharge instructions, home safety.    Plan:   Treatment/Interventions: Exercise, LE strengthening/ROM, Patient/family training, Compensatory technique education, Continued evaluation, Equipment eval/education        PT Frequency: 4-5x/wk     Continue plan of care.       Precautions and Contraindications:   Precautions  Weight Bearing Status: RLE WBAT  Other Precautions: falls, R LE infection s/p TKA    Updated Medical Status/Imaging/Labs:   Lab Results   Component Value Date/Time    HGB 10.9 (L)  11/23/2016 04:09 AM    HCT 34.2 (L) 11/23/2016 04:09 AM    K 4.0 11/18/2016 08:16 AM    NA 135 (L) 11/18/2016 08:16 AM    INR 1.0 11/23/2016 06:23 AM             Subjective:   Patient Goal: to get better and d/c home    Pain Assessment  Pain Assessment: Numeric Scale (0-10)  Pain Score: 7-severe pain  POSS Score: Awake and Alert  Pain Location: Leg;Knee  Pain Orientation: Right  Pain Descriptors: Tightness;Sore  Pain Frequency: Constant/continuous (dec from 10/10 to 7/10 with heel slides)  Effect of Pain on Daily Activities: moderate  Pain Intervention(s): Medication (See eMAR);Repositioned;Elevated  Multiple Pain Sites: No    Patient's medical condition is appropriate for Physical Therapy intervention at this time.  Patient is agreeable to participation in the therapy session. Family and/or guardian are agreeable to patient's participation in the therapy session. Nursing clears patient for therapy.    Objective:   Observation of Patient/Vital Signs:  Patient is seated in a bedside chair with dressings and peripheral IV in place.    Cognition/Neuro Status  Arousal/Alertness: Appropriate responses to stimuli  Attention Span: Appears intact  Orientation Level: Oriented X4  Memory: Appears intact  Following Commands: Follows all commands and directions without difficulty  Safety Awareness: independent  Insights: Fully aware of deficits  Problem Solving: Able to problem solve independently  Behavior: attentive;calm;cooperative  Motor Planning: intact  Coordination: intact  Orientation Level: Oriented X4  Functional Mobility:  Supine to Sit: Unable to assess (Comment) (up in chair)  Sit to Supine: Unable to assess (Comment) (up in chair)  Sit to Stand: Unable to assess (Comment) (only seen for AAROM)  Functional Mobility Deferred (Comment): only seen for AAROM       Ambulation:  PMP - Progressive Mobility Protocol   PMP Activity: Step 5 - Chair  Distance Walked (ft) (Step 6,7): 0 Feet     Ambulation: Not  performed;Unable to assess (Comment) (pt with inc pain only seen for AAROM)  Stair Management: not attempted;unable to perform (comment) (see am notes)    Therapeutic Exercise  Heelslides: 20 (AAROM)  Ankle Pumps: 10               Patient Participation: good  Patient Endurance: fair    Patient left with call bell within reach, all needs met, SCDs none on pt when entered room, fall mat  Not in use in room, bed alarm n/a, chair alarm n/a (not a high fall risk) and all questions answered. RN notified of session outcome and patient response.     Goals:  Goals  Goal Formulation: With patient/family  Time for Goal Acheivement: 3 visits  Pt Will Go Supine To Sit: modified independent  Pt Will Perform Sit to Stand: modified independent  Pt Will Transfer Bed/Chair: with rolling walker, modified independent (or LRD)  Pt Will Ambulate: > 200 feet, with rolling walker, modified independent (or LRD)  Pt Will Go Up / Down Stairs: 6-10 stairs, with stand by assist, With rail, With [facility]  Pt Will Perform Home Exer Program: independent      Carron Brazen, DPT  Pager # [phone]      Time of Treatment  PT Received On: 11/24/16  Start Time: 1735  Stop Time: 1800  Time Calculation (min): 25 min  Treatment # 2 out of 3 visits

## 2016-11-24 NOTE — Plan of Care (Signed)
Problem: Safety  Goal: Patient will be free from injury during hospitalization   11/24/16 1842   Goal/Interventions addressed this shift   Patient will be free from injury during hospitalization  Hourly rounding       Problem: Pain  Goal: Pain at adequate level as identified by patient  Outcome: Progressing   11/24/16 1842   Goal/Interventions addressed this shift   Pain at adequate level as identified by patient Identify patient comfort function goal;Evaluate if patient comfort function goal is met;Reassess pain within 30-60 minutes of any procedure/intervention, per Pain Assessment, Intervention, Reassessment (AIR) Cycle

## 2016-11-24 NOTE — Procedures (Addendum)
Peripherally Inserted Central Catheter (PICC) PROCEDURE NOTE    Copeman,[name]  11/24/2016    INDICATIONS: Home care intravenous therapy    CONSENT: The procedure, risks, benefits and alternatives were discussed with the patient.  All questions were answered, and informed written consent was obtained from the patient.  Informed consent and Procedure Boarding Pass were completed.     PROCEDURE DETAILS:      Ultrasound was used to confirm patency of the Left Basilic vein prior to obtaining venous access. The area to be punctured was  cleansed with chlorhexidine 2% for more than 30 seconds and the site draped using maximal sterile barrier precautions.    After 1% lidocaine injected followed by puncture of the Left Basilic with a 21-gauge single-wall needle under direct sonographic guidance. Guidewire was advanced and the needle was removed and a peel-away sheath was placed. The catheter was then trimmed and advanced through the peel-away sheath using electronic navigation.  The sheath was removed, brisk blood return confirmed, the catheter was flushed with normal saline and capped.      Maximal sterile barrier was used: cap, mask, sterile gloves, sterile gown and body drape.  All guide wires were removed with tip intact by visual inspection.  The catheter was stabilized on the skin using a securement device.  Antimicrobial disc and sterile transparent occlusive dressing applied using aseptic technique.     Patient did tolerate procedure well.        Catheter Type: Power picc 69f single lumen  Insertion Site (vein): Right Basilic  Total Length: 49 cm  Internal Length: 48 cm  External Length: 1 cm  UAC: 40 cm    PICC Reference #: [id number]  PICC Kit Lot #: [id number]  PICC Kit expiration date: 2017-08-12    FINDINGS/CONCLUSIONS:   No signs of bleeding or symptoms of nerve irritation noted.  Final tip location was confirmed utilizing VPS/ECG tip confirmation.  PICC is ready for use  PICC education / instructions  provided to patient.  Benedict Kue Perlie Gold, RN

## 2016-11-24 NOTE — UM Notes (Signed)
UTILIZATION REVIEW CONTACT: Name: [name]  Clinical Case Manager  - Utilization Review  [facility]  Address:  [address] [place], [place]  [address]  NPI:   [id number]  Tax ID:  [id number]  Phone: [phone]  Fax: [phone]    Please use fax number [phone] to provide authorization for hospital services or to request additional information.        PATIENT NAME: [name]   DOB: 02-25-1960   PMH:  has a past medical history of At risk for sleep apnea (2018); Disorder of prostate; History of MRSA infection (2015); Hypertension (2008); Malignant tumor of prostate (06/2016); and MRSA (methicillin resistant staph aureus) culture positive (11/08/2016).  PSH:  has a past surgical history that includes Splenectomy (2012); Spinal fusion (1990); DEBRIDEMENT & IRRIGATION, UPPER EXTREMITY (06/04/2013); BIOPSY, PROSTATE, TRANSRECTAL ULTRASOUND, (TRUS) (N/A, 12/09/2014); Knee arthroscopy (Right, 2017); BIOPSY, PROSTATE, TRANSRECTAL ULTRASOUND, (TRUS) (N/A, 05/02/2016); Colonoscopy (2017); ROBOTIC, PROSTATECTOMY W/ PELVIC LYMPH NODE DISSECTION (Bilateral, 07/17/2016); PROSTATECTOMY, RADICAL (Bilateral, 07/17/2016); LAPAROSCOPIC, ENTEROLYSIS (N/A, 07/17/2016); ARTHROPLASTY, KNEE, TOTAL (Right, 11/13/2016); and INCISION & DRAINAGE, WOUND (Right, 11/22/2016).     Continued stay Review    56YO Man from [place], [place] with multiple prior Orthopedic surgeries on a substrate of bodybuilding and purported self-injection of anabolic steroids at home admitted after recent R TKR on 11/13/16 now presenting with RLE pain, leucocytosis.    CSR Date: 11/24/16    VS: 98.0, 81, 18, 120/55, 94% RA    Medications:   piperacillin-tazobactam (ZOSYN) 4.5 g in sodium chloride 0.9 % 100 mL IVPB mini-bag plus New Bag   4.5 g, IV, Q6H    10/12 1048     vancomycin (VANCOCIN) 1,750 mg in sodium chloride 0.9 % 500 mL IVPB New Bag   1,750 mg, IV, Q12H    10/12 0448     Assessment/Plan: Patient seen by Ortho yesterday.    Satisfactory post-op course status post Procedure(s):  RIGHT KNEE I&D/ DEEP CULTURE/ EVACUATION OF HEMATOMA     Plan:  Continue per clinical pathway for Procedure(s):  RIGHT KNEE I&D/ DEEP CULTURE/ EVACUATION OF HEMATOMA     1. Patient stable post op day #1 from wash-out I&D right knee - evacuation of hematoma.  2. PCA discontinued. Dilaudid IV ordered for breakthrough pain.   3. Continue to mobilize as tolerated.  4. Continue recommendations per infectious disease and medicine teams.   5. Patient to be evaluated tomorrow (11/24/16) with possible discharge pending above.    Patient seen by PT/OT this AM:   D/C Recommendations: Home with supervision, Home with outpatient PT     Daniel Lynn, RN, BSN  UR Case Manager   [facility]  T [phone]

## 2016-11-24 NOTE — PT Progress Note (Signed)
[facility]   Physical Therapy Treatment  Patient:  [name] MRN#:  [id number]  Unit: [facility] TOWER 8  Bed: V409/W119.14    Discharge Recommendations:   D/C Recommendations: Home with supervision, Home with outpatient PT   DME Recommendations: In place        Assessment:   Pt's pain appears to be under control. Pt able to ambulate on level surfaces with FWW with supervision and stairs with rail, one step at a time with supervision. ROM of the right knee is very limited (10-35 degrees in sitting).  Pt instructed in and encouraged to perform TE to improve ROM.     Treatment Activities: Gait on level surfaces and stairs, TE to right knee.    Educated the patient to role of physical therapy, plan of care, goals of therapy and HEP, safety with mobility and ADLs.    Plan:   PT Frequency: 4-5x/wk    Continue plan of care.       Precautions and Contraindications:   Falls  Contact isolation (MRSA)  FWB RLE  Right TKR 11/13/2016    Updated Medical Status/Imaging/Labs: Hgb10.9 (L)g/dL NWGNFAOZHY86.5 (L)%      Subjective:    Pt states his pain is much better today.  Patient's medical condition is appropriate for Physical Therapy intervention at this time.  Patient is agreeable to participation in the therapy session. Nursing clears patient for therapy.    Pain:   Scale: 0/10 at rest;5/10 with end range flexion  Location: right knee  Intervention: RN made aware      Objective:   Patient is seated in a cardiac chair with peripheral IV access, post-op dressing in place.    Cognition  Intact    Functional Mobility  Rolling: not tested  Supine to Sit: not tested  Scooting: Independent in chair  Sit to Stand: M/I  Stand to Sit: M/I  Transfers: supervision    Ambulation  PMP - Progressive Mobility Protocol   PMP Activity: Step 7 - Walks out of Room  Distance Walked (ft) (Step 6,7): 200 Feet (with FWW with supervision.)   Level of Assistance required: supervision to M/I  Pattern: stiff legged  gait.  Device Used: FWW  Weightbearing Status: FWB RLE  Stair Management: Supervision, verbal cues with rail one step at a time  Number of Stairs: flight    Balance  Static Sitting: Good  Dynamic Sitting: Good  Static Standing: Good with FWW  Dynamic Standing: Good with FWW    Therapeutic Exercises  LE ankle pumps, quad sets, glut sets, verbally instructed in heel slides    Patient Participation: Good  Patient Endurance: Good    Patient left with call bell within reach, all needs met, SCDs on LLE, fall mat not in place when PT arrived, bed alarm n/a, chair alarm n/a-wife present and states she stays with patient. and all questions answered. RN notified of session outcome and patient response.     Goals:  Goals  Goal Formulation: With patient/family  Time for Goal Acheivement: 3 visits  Pt Will Go Supine To Sit: modified independent  Pt Will Perform Sit to Stand: modified independent  Pt Will Transfer Bed/Chair: with rolling walker, modified independent (or LRD)  Pt Will Ambulate: > 200 feet, with rolling walker, modified independent (or LRD)  Pt Will Go Up / Down Stairs: 6-10 stairs, with stand by assist, With rail, With SPC  Pt Will Perform Home Exer Program: independent  Jacklynn Ganong. Genelle Gather, PT, OCS, CLT-LANA  Pgr. #[phone]    Time of Treatment:  PT Received On: 11/24/16  Start Time: 1000  Stop Time: 1039  Time Calculation (min): 39 min  Treatment # 1 out of 3 visits

## 2016-11-25 LAB — GFR: EGFR: >60

## 2016-11-25 LAB — BASIC METABOLIC PANEL
BUN: 11 mg/dL (ref 9.0–28.0)
CO2: 21 mEq/L (ref 21–29)
Calcium: 8.7 mg/dL (ref 8.5–10.5)
Chloride: 104 mEq/L (ref 100–111)
Creatinine: 1 mg/dL (ref 0.5–1.5)
Glucose: 91 mg/dL (ref 70–100)
Potassium: 4.3 mEq/L (ref 3.5–5.1)
Sodium: 137 mEq/L (ref 136–145)

## 2016-11-25 LAB — VANCOMYCIN, TROUGH: Vancomycin Trough: 11.2 ug/mL (ref 10.0–20.0)

## 2016-11-25 LAB — HEMOLYSIS INDEX: Hemolysis Index: 30 — ABNORMAL HIGH (ref 0–18)

## 2016-11-25 NOTE — Plan of Care (Signed)
Problem: Safety  Goal: Patient will be free from injury during hospitalization   11/25/16 1810   Goal/Interventions addressed this shift   Patient will be free from injury during hospitalization  Hourly rounding       Problem: Pain  Goal: Pain at adequate level as identified by patient  Outcome: Progressing   11/25/16 1810   Goal/Interventions addressed this shift   Pain at adequate level as identified by patient Evaluate if patient comfort function goal is met;Reassess pain within 30-60 minutes of any procedure/intervention, per Pain Assessment, Intervention, Reassessment (AIR) Cycle;Identify patient comfort function goal
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
## 2016-11-25 NOTE — PT Progress Note (Signed)
[facility]   Physical Therapy Treatment  Patient:  [name] MRN#:  [id number]  Unit: [facility] TOWER 8  Bed: V409/W119.14    Discharge Recommendations:   D/C Recommendations: Home with supervision, Home with home health PT   DME Recommendations:In place    Assessment:   Pt making excellent progress this session demo sup>sit transfers with mod I and supervision overall for transfers/gait w/use of RW (ambulated 1 x 100', 1 x 200'). Pt then ascended/descended 2 x 4 steps with SUP/B HR support before returning to his room. Pt endorses I with HEP and use of CPM machine 2 x 3 hrs/day (pt utilizing machine upon PT arrival this AM - tolerating 40* of knee flex with plans to increase to 45* this afternoon). Pt would continue to benefit from acute skilled PT to address functional deficits prior to [place]. Will cont to rec home w/SUP and HHPT.    Treatment Activities: bed mobility, transfers, gait training, stair training, therex    Educated the patient to role of physical therapy, plan of care, goals of therapy and HEP, safety with mobility and ADLs, discharge instructions, home safety.    Plan:   PT Frequency: 4-5x/wk    Continue plan of care.       Precautions and Contraindications:   Falls  RLE WBAT  Contact 2/2 RLE infection s/p TKA    Updated Medical Status/Imaging/Labs: No results found.    Subjective:    "I did a lot of body building before all this"  Patient's medical condition is appropriate for Physical Therapy intervention at this time.  Patient is agreeable to participation in the therapy session. Nursing clears patient for therapy.    Pain:   Scale: denies pain  Location: na  Intervention: na    Objective:   Patient is in bed with RLE placed in CPM machine (tolerating 40* of prom knee flex) in place.    Cognition  WFL    Functional Mobility  Supine to Sit: mod I  Sit to Stand: SUP w/RW  Stand to Sit: SUP w/RW  Transfers: SUP w/RW    PMP - Progressive Mobility Protocol  PMP  Activity - Step 7  Distance Walked (ft) (Step 7): 1 x 100', 1 x 200 feet (amb w/RW, SUP, no LOB, step to gait pattern, decr'd step length)    Ambulation  Level of Assistance required: SUP  Ambulation Distance: 1 x 100', 1 x 200'  Pattern: (amb w/RW, SUP, no LOB, step to gait pattern, decr'd step length)  Device Used: RW  Weightbearing Status: WBAT RLE  Stair Management: SUP, B HR support, pt I'ly remembering "up with the good, down with the bad", step to ascending/descending   Number of Stairs: 2 x 4 steps  Door Management: na  Wheelchair Management: na    Balance  Static Sitting: good  Dynamic Sitting: good  Static Standing: good w/RW  Dynamic Standing: good- w/RW    Therapeutic Exercises  With activity    Patient Participation: good  Patient Endurance: good-    Patient left with call bell within reach, all needs met, SCDs not in use, fall mat in place, bed alarm na, chair alarm na (off prior to PT entry into room - not a high fall risk) and all questions answered. RN notified of session outcome and patient response.     Pt has met 3/6 goals. See updated goals below:    Goals:  Goals  Goal Formulation: With patient/family  Time for Goal Acheivement: 3 visits  Pt Will Go Supine To Sit: Goal met  Pt Will Perform Sit to Stand: modified independent  Pt Will Transfer Bed/Chair: with rolling walker, modified independent (or LRD)  Pt Will Ambulate: > 200 feet, with rolling walker, modified independent (or LRD)  Pt Will Go Up / Down Stairs: Goal met, New goal: 6-10 stairs, modified independent, With rail, to maximize functional mobility and independence, 3 visits  Pt Will Perform Home Exer Program: Goal met    Time of Treatment:  PT Received On: 11/25/16  Start Time: 1110  Stop Time: 1145  Time Calculation (min): 35 min  Treatment # 3 out of 3 visits    Lars Masson, PT, DPT  Pager# [phone]

## 2016-11-25 NOTE — Progress Notes (Signed)
Post OP Day 3 Days Post-Op for Procedure(s):  RIGHT KNEE I&D/ DEEP CULTURE/ EVACUATION OF HEMATOMA     Patient without significant complaints. Pain reasonably well controlled with PO Dilaudid. Swelling significantly improved after IV Lasix yesterday. Medicine team has cleared patient for discharge. Despite negative blood cultures and intra-op cultures, infectious disease has ordered home IV antibiotics. PICC line placed yesterday PM. Due to patient's insurance, approval for this home care will not be completed until Monday. Patient is tolerating PO without N/V. Voiding with no issues. Continuing to ambulate successfully including stair management and using CPM without difficulties.     Vitals:    11/25/16 1026   BP: 106/64   Pulse:    Resp:    Temp:    SpO2:      No intake or output data in the 24 hours ending 11/25/16 1456    Exam:    Awake and alert.  Oriented X 3    Dressing clean, dry and intact. No active drainage. Edema significantly improved. Minimal erythema throughout RLE. Distal pulses intact.    Calves soft, non tender.  Negative Homans.      Recent Labs  Lab 11/24/16  1413   WBC 16.24*   Hgb 10.9*   Hematocrit 34.2*         Recent Labs  Lab 11/25/16  0127   Sodium 137   Potassium 4.3   Chloride 104   CO2 21   BUN 11.0   Creatinine 1.0   Glucose 91   Calcium 8.7           Assessment:    Satisfactory post-op course status post Procedure(s):  RIGHT KNEE I&D/ DEEP CULTURE/ EVACUATION OF HEMATOMA     Plan:    Continue per clinical pathway for Procedure(s):  RIGHT KNEE I&D/ DEEP CULTURE/ EVACUATION OF HEMATOMA     1. Patient stable post op day #3 from wash out I&D right knee- evacuation of hematoma.  2. Continue to monitor pain level.  3. Continue to mobilize and use CPM as tolerated.  4. Home IV abx arrangements ordered by infectious disease, pending completion for Monday.   5. Patient is safe and medically cleared for discharge Monday from orthopedic standpoint.

## 2016-11-25 NOTE — Plan of Care (Signed)
Problem: Safety  Goal: Patient will be free from injury during hospitalization  Outcome: Progressing   11/25/16 0501   Goal/Interventions addressed this shift   Patient will be free from injury during hospitalization  Assess patient's risk for falls and implement fall prevention plan of care per policy;Provide and maintain safe environment;Use appropriate transfer methods;Ensure appropriate safety devices are available at the bedside;Include patient/ family/ care giver in decisions related to safety;Hourly rounding       Problem: Pain  Goal: Pain at adequate level as identified by patient  Outcome: Progressing   11/25/16 0501   Goal/Interventions addressed this shift   Pain at adequate level as identified by patient Identify patient comfort function goal;Assess for risk of opioid induced respiratory depression, including snoring/sleep apnea. Alert healthcare team of risk factors identified.;Assess pain on admission, during daily assessment and/or before any "as needed" intervention(s);Reassess pain within 30-60 minutes of any procedure/intervention, per Pain Assessment, Intervention, Reassessment (AIR) Cycle;Evaluate if patient comfort function goal is met;Evaluate patient's satisfaction with pain management progress       Problem: Moderate/High Fall Risk Score >5  Goal: Patient will remain free of falls  Outcome: Progressing   11/24/16 2100   OTHER   Moderate Risk (6-13) MOD-Remain with patient during toileting;MOD-Use of assistive devices-bedside commode if appropriate       Problem: Infection  Goal: Free from infection  Outcome: Progressing   11/23/16 2218   OTHER   Free from infection  Assess for signs/symptoms of infection       Problem: Impaired Mobility  Goal: Mobility/Activity is maintained at optimal level for patient  Outcome: Progressing      Comments: Medicated for pain as needed. adequate relief noted. Pt used the CPM for an hour this shift.  Continues on antibiotic therapy. Dressing to right knee  CD/I  Wife at bedside and helpful with care.Pt OOB to bathroom to void.  Call light within reach.

## 2016-11-26 NOTE — Plan of Care (Signed)
Problem: Safety  Goal: Patient will be free from injury during hospitalization  Outcome: Progressing  Pt oriented times 4 stable and has no distress, family at the bedside. Safety precaution maintained and continue monitoring.  Goal: Patient will be free from infection during hospitalization  Outcome: Progressing  On abx treatment.    Problem: Pain  Goal: Pain at adequate level as identified by patient  Outcome: Progressing  Pain meds will be given as needed.    Problem: Side Effects from Pain Analgesia  Goal: Patient will experience minimal side effects of analgesic therapy  Outcome: Progressing  Will monitor for side effect of pain meds.    Problem: Discharge Barriers  Goal: Patient will be discharged home or other facility with appropriate resources  Outcome: Progressing      Problem: Psychosocial and Spiritual Needs  Goal: Demonstrates ability to cope with hospitalization/illness  Outcome: Progressing      Problem: Moderate/High Fall Risk Score >5  Goal: Patient will remain free of falls  Outcome: Progressing  Pt ambulate with walker, safety precaution maintained and continue monitoring.     Problem: Infection  Goal: Free from infection  Outcome: Progressing  On abx treatment.    Problem: Compromised skin integrity  Goal: Skin integrity is maintained or improved  Outcome: Progressing      Problem: Impaired Mobility  Goal: Mobility/Activity is maintained at optimal level for patient  Outcome: Progressing      Problem: Peripheral Neurovascular Impairment  Goal: Extremity color, movement, sensation are maintained or improved  Outcome: Progressing      Problem: Compromised Tissue integrity  Goal: Damaged tissue is healing and protected  Outcome: Progressing    Goal: Nutritional status is improving  Outcome: Progressing

## 2016-11-26 NOTE — Plan of Care (Signed)
Problem: Safety  Goal: Patient will be free from injury during hospitalization  Outcome: Progressing      Problem: Pain  Goal: Pain at adequate level as identified by patient  Outcome: Progressing      Problem: Moderate/High Fall Risk Score >5  Goal: Patient will remain free of falls  Outcome: Progressing      Problem: Infection  Goal: Free from infection  Outcome: Not Progressing      Comments: Pt resting quietly. medicated for pain with adequate relief.  Pt used the CPM for about an hour, at 0-40 degrees.  Dressing to right knee CD/I.  Redness and swelling to right knee also much improved.  Continues on antibiotic therapy. PICC line to left arm.  Wife assists pt to bathroom. Pt up with walker. Steady when up.  Call light within reach.wife at the bedside all shift.
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
## 2016-11-27 ENCOUNTER — Other Ambulatory Visit: Payer: Self-pay

## 2016-11-27 NOTE — UM Notes (Signed)
CSR for 11/27/16    VS: 97.0, 64, 18, 131/66, 98% RA    Per Ortho PA: Patient is medically cleared for discharge from orthopedics standpoint pending home arrangements for IV antibiotics.     Randa Lynn, RN, BSN  UR Case Manager   [facility]  T [phone]

## 2016-11-27 NOTE — Plan of Care (Signed)
Problem: Safety  Goal: Patient will be free from injury during hospitalization  Outcome: Completed Date Met: 11/27/16   11/27/16 1949   Goal/Interventions addressed this shift   Patient will be free from injury during hospitalization  Assess patient's risk for falls and implement fall prevention plan of care per policy;Provide and maintain safe environment;Use appropriate transfer methods;Ensure appropriate safety devices are available at the bedside;Include patient/ family/ care giver in decisions related to safety;Hourly rounding   Pt alert and oriented and able to communicate needs clearly. No safety issues noted as pt uses safety devices and requests assistance prn.  Goal: Patient will be free from infection during hospitalization  Outcome: Completed Date Met: 11/27/16  Pt afebrile. Surgical dressings clean and dry. Lungs clear. Using incentive spirometer effectively.    Problem: Pain  Goal: Pain at adequate level as identified by patient  Outcome: Completed Date Met: 11/27/16   11/27/16 1949   Goal/Interventions addressed this shift   Pain at adequate level as identified by patient Identify patient comfort function goal;Assess for risk of opioid induced respiratory depression, including snoring/sleep apnea. Alert healthcare team of risk factors identified.;Assess pain on admission, during daily assessment and/or before any "as needed" intervention(s);Reassess pain within 30-60 minutes of any procedure/intervention, per Pain Assessment, Intervention, Reassessment (AIR) Cycle;Evaluate patient's satisfaction with pain management progress;Evaluate if patient comfort function goal is met   Pt relaxing comfortable with minimal discomfort.    Problem: Side Effects from Pain Analgesia  Goal: Patient will experience minimal side effects of analgesic therapy  Outcome: Completed Date Met: 11/27/16  No side effects.    Problem: Discharge Barriers  Goal: Patient will be discharged home or other facility with appropriate  resources  Outcome: Completed Date Met: 11/27/16   11/27/16 1949   Goal/Interventions addressed this shift   Discharge to home or other facility with appropriate resources Provide appropriate patient education   Discharge orders written and reviewed with pt including medications, medication reconciliation record, wound care, constipation prevention, and physician's discharge orders. Pt to be followed at home by infusion service to receive IV antibiotics. Pt discharged to home accompanied by wife.    Problem: Psychosocial and Spiritual Needs  Goal: Demonstrates ability to cope with hospitalization/illness  Outcome: Completed Date Met: 11/27/16  Good spirits and cooperative.    Problem: Moderate/High Fall Risk Score >5  Goal: Patient will remain free of falls  Outcome: Completed Date Met: 11/27/16      Problem: Infection  Goal: Free from infection  Outcome: Completed Date Met: 11/27/16      Problem: Compromised skin integrity  Goal: Skin integrity is maintained or improved  Outcome: Completed Date Met: 11/27/16  Skin clean and dry and no pressure areas noted.    Problem: Impaired Mobility  Goal: Mobility/Activity is maintained at optimal level for patient  Outcome: Completed Date Met: 11/27/16   11/27/16 1949   Goal/Interventions addressed this shift   Mobility/activity is maintained at optimal level for patient Increase mobility as tolerated/progressive mobility;Encourage independent activity per ability;Maintain proper body alignment;Perform active/passive ROM;Plan activities to conserve energy, plan rest periods;Reposition patient every 2 hours and as needed unless able to reposition self   Pt ambulating in room and hall using walker and one stand by assist. Noted to be stable on feet.    Problem: Peripheral Neurovascular Impairment  Goal: Extremity color, movement, sensation are maintained or improved  Outcome: Completed Date Met: 11/27/16   11/27/16 1949   Goal/Interventions addressed this shift   Extremity  color, movement, sensation are maintained or improved  Increase mobility as tolerated/progressive mobility;Assess extremity for proper alignment;Teach/review/reinforce ankle pump exercises;VTE Prevention: Administer anticoagulant(s) and/or apply anti-embolism stockings/devices as ordered   Neurovascular status intact. Distal extremities warm with good capillary refill, no numbness or tingling. Pt able to dorsal and plantar flex with moderate strength.

## 2016-11-27 NOTE — Progress Notes (Signed)
Home Health liasion received TC from [place] Rep from [facility], patient has no home IV coverage. Patient would have to pay out of pocket for his home infusion which would cost $235 per day for both infusion.   Would send this referral to 3 other infusion  companies for price quotes. Susanne CM notified.
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
## 2016-11-27 NOTE — Discharge Instructions (Signed)
Hydromorphone tablets  Brand Name: Dilaudid  What is this medicine?  HYDROMORPHONE (hye droe MOR fone) is a pain reliever. It is used to treat moderate to severe pain.  How should I use this medicine?  Take this medicine by mouth with a glass of water. Follow the directions on the prescription label. You can take this medicine with or without food. If it upsets your stomach, take it with food. Take your medicine at regular intervals. Do not take it more often than directed. Do not stop taking except on your doctor's advice.  A special MedGuide will be given to you by the pharmacist with each prescription and refill. Be sure to read this information carefully each time.  Talk to your pediatrician regarding the use of this medicine in children. Special care may be needed.  What side effects may I notice from receiving this medicine?  Side effects that you should report to your doctor or health care professional as soon as possible:   allergic reactions like skin rash, itching or hives, swelling of the face, lips, or tongue   breathing problems   confusion   seizures   signs and symptoms of low blood pressure like dizziness; feeling faint or lightheaded, falls; unusually weak or tired   trouble passing urine or change in the amount of urine  Side effects that usually do not require medical attention (report to your doctor or health care professional if they continue or are bothersome):   constipation   dry mouth   nausea, vomiting   tiredness  What may interact with this medicine?  This medicine may interact with the following medications:   alcohol   antihistamines for allergy, cough and cold   certain medicines for anxiety or sleep   certain medicines for depression like amitriptyline, fluoxetine, sertraline   certain medicines for seizures like phenobarbital, primidone   general anesthetics like halothane, isoflurane, methoxyflurane, propofol   local anesthetics like lidocaine, pramoxine,  tetracaine   MAOIs like Carbex, Eldepryl, Marplan, Nardil, and Parnate   medicines that relax muscles for surgery   other narcotic medicines for pain or cough   phenothiazines like chlorpromazine, mesoridazine, prochlorperazine, thioridazine  What if I miss a dose?  If you miss a dose, take it as soon as you can. If it is almost time for your next dose, take only that dose. Do not take double or extra doses.  Where should I keep my medicine?  Keep out of the reach of children. This medicine can be abused. Keep your medicine in a safe place to protect it from theft. Do not share this medicine with anyone. Selling or giving away this medicine is dangerous and against the law.  Store at room temperature between 15 and 30 degrees C (59 and 86 degrees F). Keep container tightly closed. Protect from light.  This medicine may cause accidental overdose and death if it is taken by other adults, children, or pets. Flush any unused medicine down the toilet to reduce the chance of harm. Do not use the medicine after the expiration date.  What should I tell my health care provider before I take this medicine?  They need to know if you have any of these conditions:   brain tumor   drug abuse or addiction   head injury   heart disease   if you often drink alcohol   kidney disease   liver disease   lung or breathing disease, like asthma   problems urinating     seizures   stomach or intestine problems   an unusual or allergic reaction to hydromorphone, other medicines, foods, dyes, or preservatives   pregnant or trying to get pregnant   breast-feeding  What should I watch for while using this medicine?  Tell your doctor or health care professional if your pain does not go away, if it gets worse, or if you have new or a different type of pain. You may develop tolerance to the medicine. Tolerance means that you will need a higher dose of the medicine for pain relief. Tolerance is normal and is expected if you take  this medicine for a long time.  Do not suddenly stop taking your medicine because you may develop a severe reaction. Your body becomes used to the medicine. This does NOT mean you are addicted. Addiction is a behavior related to getting and using a drug for a non-medical reason. If you have pain, you have a medical reason to take pain medicine. Your doctor will tell you how much medicine to take. If your doctor wants you to stop the medicine, the dose will be slowly lowered over time to avoid any side effects.  There are different types of narcotic medicines (opiates). If you take more than one type at the same time or if you are taking another medicine that also causes drowsiness, you may have more side effects. Give your health care provider a list of all medicines you use. Your doctor will tell you how much medicine to take. Do not take more medicine than directed. Call emergency for help if you have problems breathing or unusual sleepiness.  You may get drowsy or dizzy. Do not drive, use machinery, or do anything that needs mental alertness until you know how this medicine affects you. Do not stand or sit up quickly, especially if you are an older patient. This reduces the risk of dizzy or fainting spells. Alcohol may interfere with the effect of this medicine. Avoid alcoholic drinks.  This medicine will cause constipation. Try to have a bowel movement at least every 2 to 3 days. If you do not have a bowel movement for 3 days, call your doctor or health care professional.  Your mouth may get dry. Chewing sugarless gum or sucking hard candy, and drinking plenty of water may help. Contact your doctor if the problem does not go away or is severe.  NOTE:This sheet is a summary. It may not cover all possible information. If you have questions about this medicine, talk to your doctor, pharmacist, or health care provider. Copyright 2018 [employer]
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
## 2016-11-27 NOTE — Progress Notes (Signed)
Patient is medically cleared for discharge from orthopedics standpoint pending home arrangements for IV antibiotics.

## 2016-11-27 NOTE — Plan of Care (Signed)
Problem: Safety  Goal: Patient will be free from injury during hospitalization  Outcome: Progressing      Problem: Pain  Goal: Pain at adequate level as identified by patient  Outcome: Progressing      Problem: Infection  Goal: Free from infection  Outcome: Progressing      Comments: Pt resting queitly. Continues on IV antibiotic therapy.  No elevated temp this shift. WBC also still elevated.  PICC line to left arm. Dressing change done yesterday.  Wife at the bedside and assists pt with ADLs.  Walker when up. Pain adequately controlled with po dilaudid.
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
## 2016-11-27 NOTE — OT Progress Note (Addendum)
Occupational Therapy Note    [facility]   Occupational Therapy Treatment     Patient: [name]    MRN#: [id number]   Unit: [facility]  Bed: F621/H086.57      Discharge Recommendations:   Discharge Recommendation: Home with supervision   DME Recommended for Discharge: Shower chair      Assessment:   Pt is moving well, managing his ADLs with supervision. He will D/C home with support of wife. Pt has no further OT needs. Will D/C OT.    Patient left without needs and call bell within reach. RN notified of session outcome.    Treatment Activities: ADLs, functional mobility    Educated the patient to role of occupational therapy, plan of care, goals of therapy and safety with mobility and ADLs, weight bearing precautions.    Plan:   D/C OT       Precautions and Contraindications:   Fall  RLE WBAT  Contact isolation    Updated Medical Status/Imaging/Labs:  No new pertinent labs/tests    Subjective:   Pt eager to D/c home today.  Patient's medical condition is appropriate for Occupational Therapy intervention at this time.  Patient is agreeable to participation in the therapy session. Nursing clears patient for therapy.    Pain:   Scale: "Sore"  Location: R ankle  Intervention: Repositioning, RN provided pain medication      Objective:    Patient is in bed with IV in place.    Cognition:  A&OX4  Follows directions without difficulty    Functional Mobility:  Supine to Sit: Indep  Sit to Stand: Indep  Transfers: Indep w/ RW    Car transfer reviewed    Pt currently uses recommended technique  Functional Ambulation: Indep w/ RW    PMP - Progressive Mobility Protocol   PMP Activity: Step 7 - Walks out of Room    Balance:  Static Sitting: Good  Dynamic Sitting: Good  Static Standing: Good w/ RW  Dynamic Standing: Fair+/Good with RW    Self Care and Home Management:  Eating: Indep  Grooming: Indep  Bathing: Supervision  UE Dressing: Indep  LE Dressing: Indep  Toileting:  Indep      Participation: Good  Endurance: Good    Therapeutic Exercises:  Incorporated into therapy session    Treatment Activities: ADLs, functional mobility    Educated the patient to role of occupational therapy, plan of care, goals of therapy and safety with mobility and ADLs, weight bearing precautions.      Patient left with call bell within reach, all needs met and all questions answered, RN notified of session outcome and patient response.    After session: SCDs on, floor mat in place, bed alarm N/A, chair alarm ON    Goals:    Time For Goal Achievement: 5 visits  ADL Goals  Patient will groom self: Modified Independent, at sinkside, Goal met  Patient will dress lower body: Modified Independent  Pt will complete bathing: Modified Independent  Patient will toilet: Modified Independent, Goal met  Mobility and Transfer Goals  Pt will perform functional transfers: Modified Independent, Goal met  Neuro Re-Ed Goals  Pt will perform dynamic standing balance: Modified Independent, Goal met                          Time of Treatment  OT Received On: 11/27/16  Start Time: 1400  Stop Time:  1440  Time Calculation (min): 40 min  Treatment # 2 of 5    Nigel Berthold, [place] Pager# [phone]

## 2016-12-08 NOTE — Discharge Summary (Signed)
Discharge Date: 11/27/2016     ATTENDING PHYSICIAN:  Desmond Dike, MD     HISTORY OF PRESENT ILLNESS:  The patient was admitted after a possible infection of right total knee.   He had been operated on approximately a week or so before procedure, went  home and injected himself with antibiotic, steroids.  He was noted to have  significant issues with swelling and pain and was admitted with an elevated  white count and severe pain.  He was taken the operating room.  Details are  found in the operative summary and irrigation, evacuation of a large  hemarthrosis and deep cultures were done on November 22, 2016.     He apparently had a previous history of significant bleeding when he had a  prostatectomy done for prostate cancer and had multiple history of bleeding  problems.  He was advised to make certain that this was worked up for him.   Seemed to be under control in the hospital.  There was no obvious infection  that was cultured, but because of his high risk nature and because of his  habitus and habits, infectious disease coordinated care at home with IV  antibiotics.  Details are found in their summary.  He immediately  defervesced and did well, no real history of fever was noted.  A white  count improved significantly after the washout and the patient will be  followed as an outpatient with IV antibiotics, being managed by infectious  disease.  Neurologically, he remained intact.  No other obvious site of  bleeding was appreciated.  Calf was soft.  No signs of compartment syndrome  could be appreciated.  The remainder of historical, physical, and  laboratory findings can be found in the chart.  He was very carefully  advised that he should be compliant, careful and stop the injections of  antibiotics steroids as he certainly has no idea where they have come from,  and this could cause an issue.           D:  12/07/2016 12:43 PM by Dr. Blanchard Kelch, MD [phone])  T:  12/08/2016 05:19 AM by NTS      (Conf: [id number])  (Doc ID: [id number])

## 2017-01-08 ENCOUNTER — Emergency Department
Admission: EM | Admit: 2017-01-08 | Discharge: 2017-01-08 | Disposition: A | Discharge status: Home / Self Care | Payer: No Typology Code available for payment source | Attending: Internal Medicine | Admitting: Internal Medicine

## 2017-01-08 DIAGNOSIS — L234 Allergic contact dermatitis due to dyes: Secondary | ICD-10-CM | POA: Insufficient documentation

## 2017-01-08 MED ORDER — TRIAMCINOLONE ACETONIDE 0.1 % EX CREA
TOPICAL_CREAM | Freq: Two times a day (BID) | CUTANEOUS | 0 refills | Status: AC
Start: 2017-01-08 — End: ?

## 2017-01-08 NOTE — Discharge Instructions (Signed)
Contact Dermatitis    You have been diagnosed with contact dermatitis.    Contact dermatitis is a skin irritation. It is caused by contact with something. Sometimes the rash is from an allergic reaction. Sometimes it is from a simple chemical irritation. Some symptoms are itching and redness of the skin. In bad cases, there may be blistering. This condition is not often dangerous. However, it can be frustrating.    Contact dermatitis can be caused, for example, by: detergents, soaps and shampoos. It can be caused by nickel (found in jewelry) or poison ivy/oak. Other possible causes are wool, clothing starch, cleaning fluids, etc.    Often, the item that caused the rash cannot be identified. If you have contact dermatitis often, see a dermatologist (skin doctor) or allergist. There, you will have tests to find out the cause. If you are lucky enough to find out the cause, you can avoid it in the future.    General treatment for this condition includes:   Antihistamines (anti-itch medicines). Some antihistamines can limit itching. This includes diphenhydramine (Benadryl): 1-2 (25 mg) tablets every 6 hours). You can get this medicine without a prescription. It also includes prescription strength hydroxyzine (Atarax). Antihistamines can make you drowsy. Therefore, use them with caution. Avoid them if you must be alert (on the job or when driving, for example).   Steroid (cortisone) cream. Apply steroid creams sparingly (in small amounts) 2 times each day to the affected areas. These creams can be used until the skin irritation is gone and the skin feels normal to the touch. Put steroid creams on clean, damp skin. Then cover with a thick layer of moisturizer. As a general rule, do not use steroid cream on the face for more than 7 days. Do not use on other areas of the body for more than 14 days. Follow your doctor s instructions if he or she says that longer use is okay.   Moisturizers. Put on skin moisturizers  many times a day. 5 times or more is a good amount. Good moisturizers "seal in" water and moisture to the skin. Use them directly after (on top of) steroid creams. Also use them (alone) many more times to keep the skin from looking and feeling dry. Use greasy, stiff moisturizers that are too thick to pour. Eucerin cream (not lotion) is an example of a good brand. It can be bought over the counter (without a prescription). Vaseline is a less expensive, good skin moisturizer. Food shortening (Crisco) is another example of such a moisturizer.    YOU SHOULD SEEK MEDICAL ATTENTION IMMEDIATELY, EITHER HERE OR AT THE NEAREST EMERGENCY DEPARTMENT, IF ANY OF THE FOLLOWING OCCURS:   Symptoms get worse even with treatment.   You get signs of infection at the affected areas. Signs include redness, pus, pain, swelling or fever (temperature higher than 100.4F / 38C).
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
## 2019-03-14 NOTE — Pre-Procedure Instructions (Signed)
[facility] RN Triage Nurse review notes.  [facility] RN interview to determine if patient meets [facility] NP criteria due to history of poss OSA.

## 2019-03-18 ENCOUNTER — Other Ambulatory Visit: Payer: Self-pay | Admitting: Orthopaedic Surgery

## 2019-03-18 ENCOUNTER — Encounter: Payer: Self-pay | Admitting: Orthopaedic Surgery

## 2019-03-18 ENCOUNTER — Telehealth: Payer: No Typology Code available for payment source

## 2019-03-18 DIAGNOSIS — Z01818 Encounter for other preprocedural examination: Secondary | ICD-10-CM

## 2019-03-18 NOTE — Pre-Procedure Instructions (Signed)
.   ANESTHESIA GUIDELINES:    . Based on guidelines pt needs: COVID, cbc, bmp, ua, pt/inr, ekg, t/s, mrsa/mssa  . SURGEON REQUIREMENT:    . Noted from posting pt requires: dental clr, ua, ekg, cxr, ptt, pt/inr, mrsa/mssa, cbc, bmp, t/s COVID  . SPECIALIST NOTES/TEST RESULT REQUESTS:  . Patient has original copy of dental clr recvd and will bring a copy to lab apt to have scanned into chart   . FUTURE PLAN/UPCOMING APPTS:    . Pt scheduled to have COVID testing at [facility] on 03/23/19@1130   . Pt scheduled to have labs 03/21/19 @ [facility]   . Labs/Testing at [facility]: ua, ekg, cxr, ptt, pt/inr, mrsa/mssa, cbc, bmp, t/s   . Orders entered in epic by Clinical research associate

## 2019-03-18 NOTE — Discharge Instr - AVS First Page (Addendum)
Reason for your Hospital Admission:  RIGHT TOTAL HIP REPLACEMENT      Instructions for after your discharge:    Post-Operative Instructions for   Total Hip Replacement                     H.Edward Zenda Alpers, M.D.                                               [phone]      1.   Elevate the leg (from under the heel and ankle only) and apply an ice pack for 20  minutes every hour as needed to help reduce pain and swelling.    Use walker to walk. You can put as much weight on your leg as you can tolerate. Wean from walker to a cane as tolerated.    Change your dressing daily as discussed and demonstrated. Do not get the incision wet. Adjust hygiene methods as necessary. No baths/hot tubs/pools for 4 weeks. Do not put any lotions/creams/ointments on incision.    You may not drive until evaluated by me in the office and I give you clearance.    Schedule outpatient PT to begin as soon as possible after your first-operative appointment with Dr. Maurice March in approximately 2 weeks. An order will be given to you at this office visit, but you can call ahead to schedule these outpatient PT sessions at your preferred PT facility.      You will have DVT pumps from [facility] for approximately 2 weeks. They will coordinate pick-up and drop-off of this item. If any questions or concerns, you can contact their office at [phone].    Do not take anti-inflammatories (Advil, Aleve, Motrin, Ibuprofen, Aspirin, etc) for 6 weeks after surgery.    Lovenox will be a once a day injection for 2 weeks.    Take pain medication (Dilaudid) as directed for pain relief. Please call the office for prescription refills 1-2 days before you run out. Pain medication will not be filled on weekends. You can add up to 1,000 mg of Extra Strength Tylenol (2 tablets) every 8 hours for additional relief.     Pain Medication Tips:  Do not drive while taking pain medications.  Do not drink alcoholic beverages while taking pain  medications.  Pain medication should be taken with food as this will help prevent any stomach upset.  Often pain medications will cause constipation. Eat high fiber foods and increase your fluid intake if possible.   To alleviate constipation, purchase a stool softener at any pharmacy and follow the recommended directions on the                             bottle.    Make a postoperative appointment with my office within 10-14 days after surgery by calling [phone] xt. [phone] (Ly) if you do not already have an appointment scheduled.    If you develop severe pain, redness, swelling in the leg, difficulty breathing, or fever greater than 101.66F call my office. Fredderick Severance, physician assistant, can be reached at [phone] xt. [phone].

## 2019-03-20 ENCOUNTER — Other Ambulatory Visit: Payer: Self-pay | Admitting: Orthopaedic Surgery

## 2019-03-20 DIAGNOSIS — Z01818 Encounter for other preprocedural examination: Secondary | ICD-10-CM

## 2019-03-20 NOTE — Pre-Procedure Instructions (Addendum)
   Pt noted with hx of mrsa; DCd mrsa/mssa and ordered mrsa nare and throat; left message with pt regarding need for 2 mrsa swabs to clear. Need pcp info. Unable to find by name listed. Call to [facility]- pt is no longer seen there. Left message with surg scheduler Ly for pcp info/clr    ADDEND   Incoming call from pt- pt states he is from [facility] and is supposed to have his clearance/testing tomorrow at [facility]. Scheduled pt for NP apt at 0745 prior to lab apt. Pt will hand carry dental clr to be scanned. Pt is aware he needs 2 sets of mrsa swabs to clear and he will return Monday if needed for second set. Confirmed that [facility] will f/u on Monday with results.

## 2019-03-21 ENCOUNTER — Encounter: Payer: Self-pay | Admitting: Family

## 2019-03-21 ENCOUNTER — Ambulatory Visit (HOSPITAL_BASED_OUTPATIENT_CLINIC_OR_DEPARTMENT_OTHER): Payer: No Typology Code available for payment source

## 2019-03-21 ENCOUNTER — Ambulatory Visit
Admission: RE | Admit: 2019-03-21 | Discharge: 2019-03-21 | Disposition: A | Discharge status: Home / Self Care | Payer: No Typology Code available for payment source | Source: Ambulatory Visit | Attending: Orthopaedic Surgery | Admitting: Orthopaedic Surgery

## 2019-03-21 ENCOUNTER — Encounter: Payer: Self-pay | Admitting: Orthopaedic Surgery

## 2019-03-21 DIAGNOSIS — Z01818 Encounter for other preprocedural examination: Secondary | ICD-10-CM

## 2019-03-21 DIAGNOSIS — R9431 Abnormal electrocardiogram [ECG] [EKG]: Secondary | ICD-10-CM

## 2019-03-21 LAB — URINALYSIS, REFLEX TO MICROSCOPIC EXAM IF INDICATED
Bilirubin, UA: NEGATIVE
Blood, UA: NEGATIVE
Glucose, UA: NEGATIVE
Ketones UA: 20 — AB
Leukocyte Esterase, UA: NEGATIVE
Nitrite, UA: NEGATIVE
Protein, UR: 30 — AB
Specific Gravity UA: 1.031 (ref 1.001–1.035)
Urine pH: 5 (ref 5.0–8.0)
Urobilinogen, UA: NORMAL mg/dL (ref 0.2–2.0)

## 2019-03-21 LAB — BASIC METABOLIC PANEL
Anion Gap: 12 (ref 5.0–15.0)
BUN: 21 mg/dL (ref 9.0–28.0)
CO2: 23 mEq/L (ref 22–29)
Calcium: 9.3 mg/dL (ref 8.5–10.5)
Chloride: 99 mEq/L — ABNORMAL LOW (ref 100–111)
Creatinine: 1.3 mg/dL (ref 0.7–1.3)
Glucose: 81 mg/dL (ref 70–100)
Potassium: 4.6 mEq/L (ref 3.5–5.1)
Sodium: 134 mEq/L — ABNORMAL LOW (ref 136–145)

## 2019-03-21 LAB — CBC AND DIFFERENTIAL
Absolute NRBC: 0 10*3/uL (ref 0.00–0.00)
Basophils Absolute Automated: 0.06 10*3/uL (ref 0.00–0.08)
Basophils Automated: 0.6 %
Eosinophils Absolute Automated: 0.08 10*3/uL (ref 0.00–0.44)
Eosinophils Automated: 0.8 %
Hematocrit: 52.9 % — ABNORMAL HIGH (ref 37.6–49.6)
Hgb: 18.6 g/dL — ABNORMAL HIGH (ref 12.5–17.1)
Immature Granulocytes Absolute: 0.1 10*3/uL — ABNORMAL HIGH (ref 0.00–0.07)
Immature Granulocytes: 1.1 %
Lymphocytes Absolute Automated: 1.91 10*3/uL (ref 0.42–3.22)
Lymphocytes Automated: 20.1 %
MCH: 34.4 pg — ABNORMAL HIGH (ref 25.1–33.5)
MCHC: 35.2 g/dL (ref 31.5–35.8)
MCV: 98 fL — ABNORMAL HIGH (ref 78.0–96.0)
MPV: 10.6 fL (ref 8.9–12.5)
Monocytes Absolute Automated: 0.99 10*3/uL — ABNORMAL HIGH (ref 0.21–0.85)
Monocytes: 10.4 %
Neutrophils Absolute: 6.35 10*3/uL — ABNORMAL HIGH (ref 1.10–6.33)
Neutrophils: 67 %
Nucleated RBC: 0 /100 WBC (ref 0.0–0.0)
Platelets: 333 10*3/uL (ref 142–346)
RBC: 5.4 10*6/uL (ref 4.20–5.90)
RDW: 15 % (ref 11–15)
WBC: 9.49 10*3/uL (ref 3.10–9.50)

## 2019-03-21 LAB — ECG 12-LEAD
Atrial Rate: 82 {beats}/min
P Axis: 76 degrees
P-R Interval: 136 ms
Q-T Interval: 382 ms
QRS Duration: 102 ms
QTC Calculation (Bezet): 446 ms
R Axis: 95 degrees
T Axis: 34 degrees
Ventricular Rate: 82 {beats}/min

## 2019-03-21 LAB — PT AND APTT
PT INR: 0.9 (ref 0.9–1.1)
PT: 12.5 s — ABNORMAL LOW (ref 12.6–15.0)
PTT: 33 s (ref 23–37)

## 2019-03-21 LAB — GFR: EGFR: 56.7

## 2019-03-21 LAB — TYPE AND SCREEN
AB Screen Gel: NEGATIVE
ABO Rh: O POS
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
## 2019-03-21 NOTE — Pre-Procedure Instructions (Addendum)
03/21/2019 - CXR ,("Mild lung hyperinflation could be due to inspiratory effort or  air-trapping. No focal consolidation"), faxed to surgeon & [facility] NP notified for review.

## 2019-03-22 LAB — MRSA CULTURE
Culture MRSA Surveillance: NEGATIVE
Culture MRSA Surveillance: NEGATIVE

## 2019-03-23 ENCOUNTER — Ambulatory Visit (FREE_STANDING_LABORATORY_FACILITY): Payer: No Typology Code available for payment source

## 2019-03-23 DIAGNOSIS — Z20822 Contact with and (suspected) exposure to covid-19: Secondary | ICD-10-CM

## 2019-03-23 DIAGNOSIS — Z01818 Encounter for other preprocedural examination: Secondary | ICD-10-CM

## 2019-03-23 DIAGNOSIS — Z1159 Encounter for screening for other viral diseases: Secondary | ICD-10-CM

## 2019-03-24 ENCOUNTER — Other Ambulatory Visit: Payer: Self-pay | Admitting: Orthopaedic Surgery

## 2019-03-24 ENCOUNTER — Ambulatory Visit: Payer: No Typology Code available for payment source | Attending: Orthopaedic Surgery

## 2019-03-24 DIAGNOSIS — Z01818 Encounter for other preprocedural examination: Secondary | ICD-10-CM | POA: Insufficient documentation

## 2019-03-24 LAB — COVID-19 (SARS-COV-2): SARS CoV 2 Overall Result: NOT DETECTED

## 2019-03-24 NOTE — Pre-Procedure Instructions (Addendum)
   Call placed to patient to advise 1st set of MRSA testing negative. Pt states will come to [facility] for 2nd set today in ordered to be cleared from isolation. Pt scheduled for 1515.    Pt states Dental Cl not scanned at last appt. Will bring to have [facility] RN Scan into Epic.    [facility] Charge Notified. Orders placed in Epic.     Addendum 1603  E-Communication sent to [facility] to have IP review chart to verify criteria to remove Isolation flag has been met. Negative MRSA testing 03/21/19 & 03/24/19

## 2019-03-25 LAB — MRSA CULTURE
Culture MRSA Surveillance: NEGATIVE
Culture MRSA Surveillance: NEGATIVE
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
## 2019-03-26 ENCOUNTER — Ambulatory Visit
Admission: RE | Admit: 2019-03-26 | Discharge: 2019-03-27 | Disposition: A | Discharge status: Home / Self Care | Payer: No Typology Code available for payment source | Source: Ambulatory Visit | Attending: Orthopaedic Surgery | Admitting: Orthopaedic Surgery

## 2019-03-26 ENCOUNTER — Ambulatory Visit: Payer: No Typology Code available for payment source

## 2019-03-26 ENCOUNTER — Encounter
Admission: RE | Disposition: A | Discharge status: Home / Self Care | Payer: Self-pay | Source: Ambulatory Visit | Attending: Orthopaedic Surgery

## 2019-03-26 ENCOUNTER — Inpatient Hospital Stay: Payer: No Typology Code available for payment source | Admitting: Family

## 2019-03-26 DIAGNOSIS — Z8614 Personal history of Methicillin resistant Staphylococcus aureus infection: Secondary | ICD-10-CM | POA: Insufficient documentation

## 2019-03-26 DIAGNOSIS — I10 Essential (primary) hypertension: Secondary | ICD-10-CM | POA: Insufficient documentation

## 2019-03-26 DIAGNOSIS — M1611 Unilateral primary osteoarthritis, right hip: Secondary | ICD-10-CM | POA: Insufficient documentation

## 2019-03-26 DIAGNOSIS — Z79899 Other long term (current) drug therapy: Secondary | ICD-10-CM | POA: Insufficient documentation

## 2019-03-26 HISTORY — PX: ARTHROPLASTY, HIP TOTAL: SHX3107

## 2019-03-26 SURGERY — ARTHROPLASTY, HIP TOTAL
Anesthesia: Anesthesia General | Site: Hip | Laterality: Right | Wound class: Clean

## 2019-03-26 MED ORDER — LACTATED RINGERS IV SOLN
INTRAVENOUS | Status: DC | PRN
Start: 2019-03-26 — End: 2019-03-26

## 2019-03-26 MED ORDER — FENTANYL CITRATE (PF) 50 MCG/ML IJ SOLN (WRAP)
INTRAMUSCULAR | Status: AC
Start: 2019-03-26 — End: 2019-03-26
  Administered 2019-03-26: 11:00:00 50 ug via INTRAVENOUS
  Filled 2019-03-26: qty 2

## 2019-03-26 MED ORDER — HYDROMORPHONE HCL 0.5 MG/0.5 ML IJ SOLN
0.5000 mg | INTRAMUSCULAR | Status: DC | PRN
Start: 2019-03-26 — End: 2019-03-26
  Administered 2019-03-26: 0.5 mg via INTRAVENOUS

## 2019-03-26 MED ORDER — SODIUM CHLORIDE 0.9 % IR SOLN
Status: DC | PRN
Start: 2019-03-26 — End: 2019-03-26
  Administered 2019-03-26: 1000 mL

## 2019-03-26 MED ORDER — ROCURONIUM BROMIDE 10 MG/ML IV SOLN (WRAP)
INTRAVENOUS | Status: DC | PRN
Start: 2019-03-26 — End: 2019-03-26
  Administered 2019-03-26: 30 mg via INTRAVENOUS
  Administered 2019-03-26: 50 mg via INTRAVENOUS

## 2019-03-26 MED ORDER — ACETAMINOPHEN 500 MG PO TABS
1000.0000 mg | ORAL_TABLET | Freq: Three times a day (TID) | ORAL | Status: DC
Start: 2019-03-26 — End: 2019-03-27
  Administered 2019-03-26 – 2019-03-27 (×4): 1000 mg via ORAL
  Filled 2019-03-26 (×4): qty 2

## 2019-03-26 MED ORDER — ENOXAPARIN SODIUM 40 MG/0.4ML SC SOLN
40.0000 mg | SUBCUTANEOUS | Status: DC
Start: 2019-03-27 — End: 2019-03-27
  Administered 2019-03-27: 09:00:00 40 mg via SUBCUTANEOUS
  Filled 2019-03-26: qty 0.4

## 2019-03-26 MED ORDER — FENTANYL CITRATE (PF) 50 MCG/ML IJ SOLN (WRAP)
INTRAMUSCULAR | Status: AC
Start: 2019-03-26 — End: ?
  Filled 2019-03-26: qty 2

## 2019-03-26 MED ORDER — SODIUM CHLORIDE 0.9 % IV SOLN
INTRAVENOUS | Status: DC
Start: 2019-03-26 — End: 2019-03-27

## 2019-03-26 MED ORDER — FENTANYL CITRATE (PF) 50 MCG/ML IJ SOLN (WRAP)
INTRAMUSCULAR | Status: DC | PRN
Start: 2019-03-26 — End: 2019-03-26
  Administered 2019-03-26 (×2): 25 ug via INTRAVENOUS
  Administered 2019-03-26: 50 ug via INTRAVENOUS

## 2019-03-26 MED ORDER — ASCORBIC ACID 500 MG PO TABS
500.0000 mg | ORAL_TABLET | Freq: Every morning | ORAL | Status: DC
Start: 2019-03-26 — End: 2019-03-27
  Administered 2019-03-26 – 2019-03-27 (×2): 500 mg via ORAL
  Filled 2019-03-26 (×2): qty 1

## 2019-03-26 MED ORDER — PROPOFOL 10 MG/ML IV EMUL (WRAP)
INTRAVENOUS | Status: AC
Start: 2019-03-26 — End: ?
  Filled 2019-03-26: qty 20

## 2019-03-26 MED ORDER — LACTATED RINGERS IV SOLN
INTRAVENOUS | Status: DC
Start: 2019-03-26 — End: 2019-03-27

## 2019-03-26 MED ORDER — SODIUM CHLORIDE 0.9 % IV SOLN
12.5000 mg | Freq: Four times a day (QID) | INTRAVENOUS | Status: DC | PRN
Start: 2019-03-26 — End: 2019-03-27
  Filled 2019-03-26: qty 1

## 2019-03-26 MED ORDER — SENNOSIDES-DOCUSATE SODIUM 8.6-50 MG PO TABS
2.0000 | ORAL_TABLET | Freq: Every day | ORAL | Status: DC
Start: 2019-03-26 — End: 2019-03-27
  Administered 2019-03-26 – 2019-03-27 (×2): 2 via ORAL
  Filled 2019-03-26 (×2): qty 2

## 2019-03-26 MED ORDER — HYDROMORPHONE HCL 1 MG/ML IJ SOLN
INTRAMUSCULAR | Status: AC
Start: 2019-03-26 — End: ?
  Filled 2019-03-26: qty 1

## 2019-03-26 MED ORDER — LIDOCAINE HCL 2 % IJ SOLN
INTRAMUSCULAR | Status: DC | PRN
Start: 2019-03-26 — End: 2019-03-26
  Administered 2019-03-26: 100 mg

## 2019-03-26 MED ORDER — DIPHENHYDRAMINE HCL 25 MG PO CAPS
25.0000 mg | ORAL_CAPSULE | Freq: Every evening | ORAL | Status: DC | PRN
Start: 2019-03-26 — End: 2019-03-27

## 2019-03-26 MED ORDER — INFLUENZA VAC SPLIT QUAD 0.5 ML IM SUSY
PREFILLED_SYRINGE | Freq: Once | INTRAMUSCULAR | Status: DC
Start: 2019-03-26 — End: 2019-03-27

## 2019-03-26 MED ORDER — HYDROMORPHONE HCL 4 MG PO TABS
4.0000 mg | ORAL_TABLET | ORAL | Status: DC | PRN
Start: 2019-03-26 — End: 2019-03-27
  Administered 2019-03-26 – 2019-03-27 (×6): 4 mg via ORAL
  Filled 2019-03-26 (×6): qty 1

## 2019-03-26 MED ORDER — MAGNESIUM HYDROXIDE 400 MG/5ML PO SUSP
10.0000 mL | Freq: Every day | ORAL | Status: DC | PRN
Start: 2019-03-26 — End: 2019-03-27

## 2019-03-26 MED ORDER — VANCOMYCIN 1000 MG IN 250 ML NS IVPB VIAL-MATE (CNR)
1000.0000 mg | Freq: Once | INTRAVENOUS | Status: AC
Start: 2019-03-26 — End: 2019-03-26
  Administered 2019-03-26 (×2): 1000 mg via INTRAVENOUS

## 2019-03-26 MED ORDER — GLYCOPYRROLATE 0.2 MG/ML IJ SOLN (WRAP)
INTRAMUSCULAR | Status: AC
Start: 2019-03-26 — End: ?
  Filled 2019-03-26: qty 1

## 2019-03-26 MED ORDER — DEXMEDETOMIDINE HCL IN NACL 200 MCG/50ML IV SOLN
INTRAVENOUS | Status: AC
Start: 2019-03-26 — End: ?
  Filled 2019-03-26: qty 50

## 2019-03-26 MED ORDER — HYDROMORPHONE HCL 0.5 MG/0.5 ML IJ SOLN
0.4000 mg | INTRAMUSCULAR | Status: DC | PRN
Start: 2019-03-26 — End: 2019-03-27

## 2019-03-26 MED ORDER — STERILE WATER FOR IRRIGATION IR SOLN
Status: DC | PRN
Start: 2019-03-26 — End: 2019-03-26
  Administered 2019-03-26: 2000 mL

## 2019-03-26 MED ORDER — NEOSTIGMINE METHYLSULFATE 1 MG/ML IJ/IV SOLN (WRAP)
Status: DC | PRN
Start: 2019-03-26 — End: 2019-03-26
  Administered 2019-03-26: 3 mg via INTRAVENOUS

## 2019-03-26 MED ORDER — PROMETHAZINE HCL 25 MG PO TABS
25.0000 mg | ORAL_TABLET | Freq: Four times a day (QID) | ORAL | Status: DC | PRN
Start: 2019-03-26 — End: 2019-03-27

## 2019-03-26 MED ORDER — ONDANSETRON 4 MG PO TBDP
4.0000 mg | ORAL_TABLET | Freq: Three times a day (TID) | ORAL | Status: DC | PRN
Start: 2019-03-26 — End: 2019-03-27

## 2019-03-26 MED ORDER — VANCOMYCIN 1000 MG IN 250 ML NS IVPB VIAL-MATE (CNR)
INTRAVENOUS | Status: AC
Start: 2019-03-26 — End: ?
  Filled 2019-03-26: qty 250

## 2019-03-26 MED ORDER — ONDANSETRON HCL 4 MG/2ML IJ SOLN
4.0000 mg | Freq: Three times a day (TID) | INTRAMUSCULAR | Status: DC | PRN
Start: 2019-03-26 — End: 2019-03-27

## 2019-03-26 MED ORDER — SODIUM CHLORIDE 0.9 % IV SOLN
INTRAVENOUS | Status: DC | PRN
Start: 2019-03-26 — End: 2019-03-26
  Administered 2019-03-26: 09:00:00 25 ug/min via INTRAVENOUS

## 2019-03-26 MED ORDER — FAMOTIDINE 20 MG/2ML IV SOLN
INTRAVENOUS | Status: AC
Start: 2019-03-26 — End: ?
  Filled 2019-03-26: qty 2

## 2019-03-26 MED ORDER — PHENYLEPHRINE HCL 10 MG/ML IV SOLN (WRAP)
Status: DC | PRN
Start: 2019-03-26 — End: 2019-03-26
  Administered 2019-03-26: 100 ug via INTRAVENOUS
  Administered 2019-03-26: 200 ug via INTRAVENOUS
  Administered 2019-03-26: 100 ug via INTRAVENOUS
  Administered 2019-03-26: 200 ug via INTRAVENOUS

## 2019-03-26 MED ORDER — VALSARTAN 160 MG PO TABS
1.0000 | ORAL_TABLET | Freq: Every day | ORAL | Status: DC
Start: 2019-03-26 — End: 2019-03-26
  Filled 2019-03-26 (×2): qty 2

## 2019-03-26 MED ORDER — FERROUS SULFATE 324 (65 FE) MG PO TBEC
324.0000 mg | DELAYED_RELEASE_TABLET | Freq: Every morning | ORAL | Status: DC
Start: 2019-03-26 — End: 2019-03-27
  Administered 2019-03-26 – 2019-03-27 (×2): 324 mg via ORAL
  Filled 2019-03-26 (×2): qty 1

## 2019-03-26 MED ORDER — FENTANYL CITRATE (PF) 50 MCG/ML IJ SOLN (WRAP)
INTRAMUSCULAR | Status: AC
Start: 2019-03-26 — End: 2019-03-26
  Administered 2019-03-26: 12:00:00 50 ug via INTRAVENOUS
  Filled 2019-03-26: qty 2

## 2019-03-26 MED ORDER — SODIUM CHLORIDE 0.9 % IV SOLN
INTRAVENOUS | Status: DC | PRN
Start: 2019-03-26 — End: 2019-03-26
  Administered 2019-03-26 (×3): 8 ug via INTRAVENOUS

## 2019-03-26 MED ORDER — LACTATED RINGERS IV SOLN
INTRAVENOUS | Status: DC
Start: 2019-03-26 — End: 2019-03-26

## 2019-03-26 MED ORDER — ONDANSETRON HCL 4 MG/2ML IJ SOLN
INTRAMUSCULAR | Status: DC | PRN
Start: 2019-03-26 — End: 2019-03-26
  Administered 2019-03-26: 4 mg via INTRAVENOUS

## 2019-03-26 MED ORDER — HYDROCHLOROTHIAZIDE 25 MG PO TABS
25.0000 mg | ORAL_TABLET | Freq: Every day | ORAL | Status: DC
Start: 2019-03-26 — End: 2019-03-27
  Administered 2019-03-26 – 2019-03-27 (×2): 25 mg via ORAL
  Filled 2019-03-26 (×2): qty 1

## 2019-03-26 MED ORDER — ONDANSETRON HCL 4 MG/2ML IJ SOLN
INTRAMUSCULAR | Status: AC
Start: 2019-03-26 — End: ?
  Filled 2019-03-26: qty 2

## 2019-03-26 MED ORDER — FENTANYL CITRATE (PF) 50 MCG/ML IJ SOLN (WRAP)
50.0000 ug | INTRAMUSCULAR | Status: AC | PRN
Start: 2019-03-26 — End: 2019-03-26
  Administered 2019-03-26 (×2): 50 ug via INTRAVENOUS

## 2019-03-26 MED ORDER — OXYCODONE HCL 5 MG PO TABS
5.0000 mg | ORAL_TABLET | Freq: Once | ORAL | Status: AC | PRN
Start: 2019-03-26 — End: 2019-03-26

## 2019-03-26 MED ORDER — GLYCOPYRROLATE 0.2 MG/ML IJ SOLN (WRAP)
INTRAMUSCULAR | Status: DC | PRN
Start: 2019-03-26 — End: 2019-03-26
  Administered 2019-03-26: 0.4 mg via INTRAVENOUS

## 2019-03-26 MED ORDER — FAMOTIDINE 10 MG/ML IV SOLN (WRAP)
INTRAVENOUS | Status: DC | PRN
Start: 2019-03-26 — End: 2019-03-26
  Administered 2019-03-26: 20 mg via INTRAVENOUS

## 2019-03-26 MED ORDER — PHENYLEPHRINE HCL-NACL 100-0.9 MG/250ML-% IV SOLN
INTRAVENOUS | Status: AC
Start: 2019-03-26 — End: ?
  Filled 2019-03-26: qty 250

## 2019-03-26 MED ORDER — HYDROMORPHONE HCL 1 MG/ML IJ SOLN
INTRAMUSCULAR | Status: AC
Start: 2019-03-26 — End: 2019-03-26
  Administered 2019-03-26: 13:00:00 0.5 mg via INTRAVENOUS
  Filled 2019-03-26: qty 1

## 2019-03-26 MED ORDER — ROCURONIUM BROMIDE 50 MG/5ML IV SOLN
INTRAVENOUS | Status: AC
Start: 2019-03-26 — End: ?
  Filled 2019-03-26: qty 5

## 2019-03-26 MED ORDER — PROPOFOL 10 MG/ML IV EMUL (WRAP)
INTRAVENOUS | Status: DC | PRN
Start: 2019-03-26 — End: 2019-03-26
  Administered 2019-03-26: 300 mg via INTRAVENOUS

## 2019-03-26 MED ORDER — AMMONIA AROMATIC IN INHA
1.0000 | Freq: Once | RESPIRATORY_TRACT | Status: DC | PRN
Start: 2019-03-26 — End: 2019-03-26

## 2019-03-26 MED ORDER — VANCOMYCIN 1000 MG IN 250 ML NS IVPB VIAL-MATE (CNR)
1000.0000 mg | Freq: Once | INTRAVENOUS | Status: AC
Start: 2019-03-26 — End: 2019-03-26
  Administered 2019-03-26: 15:00:00 1000 mg via INTRAVENOUS
  Filled 2019-03-26: qty 250

## 2019-03-26 MED ORDER — BENZOCAINE-MENTHOL 15-3.6 MG MT LOZG
1.00 | LOZENGE | OROMUCOSAL | Status: DC | PRN
Start: 2019-03-26 — End: 2019-03-27
  Administered 2019-03-26: 1 via BUCCAL
  Filled 2019-03-26: qty 1

## 2019-03-26 MED ORDER — PROMETHAZINE HCL 25 MG RE SUPP
25.0000 mg | Freq: Four times a day (QID) | RECTAL | Status: DC | PRN
Start: 2019-03-26 — End: 2019-03-27

## 2019-03-26 MED ORDER — NALOXONE HCL 0.4 MG/ML IJ SOLN (WRAP)
0.4000 mg | INTRAMUSCULAR | Status: DC | PRN
Start: 2019-03-26 — End: 2019-03-27

## 2019-03-26 MED ORDER — MIDAZOLAM HCL 1 MG/ML IJ SOLN (WRAP)
INTRAMUSCULAR | Status: DC | PRN
Start: 2019-03-26 — End: 2019-03-26
  Administered 2019-03-26: 2 mg via INTRAVENOUS

## 2019-03-26 MED ORDER — OXYCODONE HCL 5 MG PO TABS
ORAL_TABLET | ORAL | Status: AC
Start: 2019-03-26 — End: 2019-03-26
  Administered 2019-03-26: 12:00:00 5 mg via ORAL
  Filled 2019-03-26: qty 1

## 2019-03-26 MED ORDER — LIDOCAINE HCL (PF) 2 % IJ SOLN
INTRAMUSCULAR | Status: AC
Start: 2019-03-26 — End: ?
  Filled 2019-03-26: qty 5

## 2019-03-26 MED ORDER — VALSARTAN 160 MG PO TABS
320.0000 mg | ORAL_TABLET | Freq: Every day | ORAL | Status: DC
Start: 2019-03-26 — End: 2019-03-27
  Administered 2019-03-26 – 2019-03-27 (×2): 320 mg via ORAL
  Filled 2019-03-26 (×2): qty 2

## 2019-03-26 MED ORDER — MIDAZOLAM HCL 1 MG/ML IJ SOLN (WRAP)
INTRAMUSCULAR | Status: AC
Start: 2019-03-26 — End: ?
  Filled 2019-03-26: qty 2

## 2019-03-26 MED ORDER — HYDROMORPHONE HCL 1 MG/ML IJ SOLN
INTRAMUSCULAR | Status: DC | PRN
Start: 2019-03-26 — End: 2019-03-26
  Administered 2019-03-26 (×2): 1 mg via INTRAVENOUS

## 2019-03-26 MED ORDER — HYDROMORPHONE HCL 2 MG PO TABS
2.0000 mg | ORAL_TABLET | ORAL | Status: DC | PRN
Start: 2019-03-26 — End: 2019-03-27
  Administered 2019-03-26: 2 mg via ORAL
  Filled 2019-03-26: qty 1

## 2019-03-26 MED ORDER — DIPHENHYDRAMINE HCL 25 MG PO CAPS
25.0000 mg | ORAL_CAPSULE | Freq: Two times a day (BID) | ORAL | Status: DC | PRN
Start: 2019-03-26 — End: 2019-03-27

## 2019-03-26 MED ORDER — ONDANSETRON HCL 4 MG/2ML IJ SOLN
4.0000 mg | Freq: Once | INTRAMUSCULAR | Status: DC | PRN
Start: 2019-03-26 — End: 2019-03-26

## 2019-03-26 MED ORDER — PHENYLEPHRINE 100 MCG/ML IV SOSY (WRAP)
PREFILLED_SYRINGE | INTRAVENOUS | Status: AC
Start: 2019-03-26 — End: ?
  Filled 2019-03-26: qty 10

## 2019-03-26 SURGICAL SUPPLY — 108 items
APPLICATOR CHLORAPREP 26 ML 70% ISOPROPYL ALCOHOL 2% CHLORHEXIDINE (Applicator) ×2 IMPLANT
APPLICATOR PRP 70% ISPRP 2% CHG 26ML (Applicator) ×2
BIT DRILL L30 MM OD3.2 MM (Drillbits) ×1
BIT DRILL L30 MM OD3.2 MM RINGLOC+ (Drillbits) ×1 IMPLANT
BLADE ELECTRODE EXTENDED 6IN (Cautery) ×1
BLADE SAW SAG 25X90X1.27X127MM (Blade) ×1
BLADE SAW SAGITTAL HEAVY DUTY L91.5 MM X (Blade) ×1
BLADE SAW SAGITTAL HEAVY DUTY NA L91.5 MM X W25 MM X H1.27 MM (Blade) ×1 IMPLANT
BUR MATCH 9CM X 3MM (Burr)
CARTRIDGE LUB DIFFUSER LEGEND (Ortho Supply)
CHLORAPREP APLCTR ORANGE 26ML (Applicator) ×2
CLEANER ELECTROSURGICAL TIP PENCIL (Procedure Accessories) ×1
CLEANER ELECTROSURGICAL TIP PENCIL HANDSWITCH LECTROBRASIVE (Procedure Accessories) ×1 IMPLANT
COVER FLEXIBLE LIGHT HANDLE PLASTIC GREEN (Procedure Accessories) ×2 IMPLANT
COVER FLEXIBLE MEDLINE LIGHT HANDLE (Procedure Accessories) ×2
CVR LGHTHNDL FLEX SFT 1PK (Procedure Accessories) ×2
DRAPE SURGICAL FANFOLD L98 IN X W72 IN (Drape) ×1
DRAPE SURGICAL FANFOLD L98 IN X W72 IN CONVERTORS TIBURON LARGE (Drape) ×1 IMPLANT
DRAPE SURGICAL REINFORCE ARMBOARD COVER (Drape) ×1
DRAPE SURGICAL REINFORCE ARMBOARD COVER POUCH ABSORBENT L134 IN X W115 (Drape) ×1 IMPLANT
DRAPE TIBURON HIP WITH POUCH (Drape) ×1
DRESSING TEGADERM 8X12IN (Dressing) ×1
DRESSING TRANSPARENT L12 IN X W8 IN (Dressing) ×1
DRESSING TRANSPARENT L12 IN X W8 IN POLYURETHANE ADHESIVE (Dressing) ×1 IMPLANT
DRILL BIT RINGLOC 3.2X30MM (Drillbits) ×1
ECHO POR FMRL NC 13X145 (Stem) ×1 IMPLANT
ELECTRODE ADULT PATIENT RETURN L9 FT REM POLYHESIVE ACRYLIC FOAM (Procedure Accessories) ×1 IMPLANT
ELECTRODE ELECTROSURGICAL BLADE L6.5 IN (Cautery) ×1
ELECTRODE ELECTROSURGICAL BLADE L6.5 IN OD3/32 IN VALLEYLAB STAINLESS (Cautery) ×1 IMPLANT
ELECTRODE PATIENT RETURN L9 FT VALLEYLAB (Procedure Accessories) ×1
GAUZE SPONGE 4X4 NS (Dressing) ×1
GLOVE SURG BIOGEL INDIC SZ 6.5 (Glove) ×1
GLOVE SURG BIOGEL INDIC SZ 8.5 (Glove) ×3
GLOVE SURG STRL UNIVERSAL SZ 6 (Glove) ×2
GLOVE SURGICAL 6 1/2 BIOGEL INDICATOR (Glove) ×1
GLOVE SURGICAL 6 1/2 BIOGEL INDICATOR POWDER FREE SMOOTH BEAD CUFF (Glove) ×1 IMPLANT
GLOVE SURGICAL 6 BIOGEL SKINSENSE INDICATOR UNDERGLOVE POWDER FREE (Glove) ×2 IMPLANT
GLOVE SURGICAL 8.5 BIOGEL INDICATOR (Glove) ×3
GLOVE SURGICAL 8.5 BIOGEL INDICATOR POWDER FREE BEAD CUFF SMOOTH (Glove) ×3 IMPLANT
GOWN STANDARD XLG W/ PAPER TWL (Gown) ×1 IMPLANT
GOWN STNDRD XLG (W/ PAPER TWL) (Gown) ×2
HEAD FEMORAL OD32 MM +6 MM OFFSET HIP (Head) ×1 IMPLANT
HEAD FEMORAL OD32 MM +6 MM OFFSET HIP COCR G7 MODULAR TYPE 1 NO SKIRT (Head) ×1 IMPLANT
HEAD MODULAR 32MM 6MM NECK (Head) ×1 IMPLANT
KIT SURGICAL TOTAL JOINT FFX (Pack) ×1
LINER ACETABULAR ID32 MM F VIVACIT-E G7 (Liner) ×1 IMPLANT
LINER ACETABULAR ID32 MM F VIVACIT-E G7 LUMEN HIP (Liner) ×1 IMPLANT
LINER G7 ACETAB VE HW 32MM SZF (Liner) ×1 IMPLANT
LINER GLV PCP 6 BGL SKNSN INDCTR (Glove) ×2
LUBRICANT INSTRUMENT DIFFUSER CARTRIDGE (Ortho Supply)
LUBRICANT INSTRUMENT DIFFUSER CARTRIDGE MIDAS REX MR7 (Ortho Supply) IMPLANT
NEEDLE  19G ASPIRATING (Needles) ×2 IMPLANT
NEEDLE MAYO CATGUT SZ3.51/2 C (Suture) ×1
NEEDLE SUTURE 1/2 CIRCLE L1.404 IN (Suture) ×1
NEEDLE SUTURE 1/2 CIRCLE L1.404 IN TROCAR POINT FREE EYE RICHARD-ALLAN (Suture) ×1 IMPLANT
PAD ABDOMINAL L9 IN X W5 IN 4 SEAL EDGE (Dressing) ×1 IMPLANT
PAD CURITY ABDOMINAL 5X9IN (Dressing) ×1
PAD ELECTROSRG GRND REM W CRD (Procedure Accessories) ×1
PAD VALLEYLAB SCRATCH 5.08CM (Procedure Accessories) ×1
PILLOW ABDUCTION STD LG (Immobilizer) ×2 IMPLANT
POUCH 11X7IN 2 ADHSV STRIP 2 COMPARTMENT STERI-DRAPE INSTRUMENT PLSTC (Drape) ×1 IMPLANT
POUCH INST 18X30CM (Drape) ×1
POUCH L11 IN X W7 IN 2 ADHESIVE STRIP 2 (Drape) ×1
SCREW L20 MM OD6.5 MM ACETABULAR HIP SELF TAP BONE TRILOGY (Screw) ×1 IMPLANT
SCREW L20 MM OD6.5 MM HIP ACETABULAR (Screw) ×1 IMPLANT
SCREW L25 MM OD6.5 MM HIP SELF TAP BONE (Screw) ×1 IMPLANT
SCREW L25 MM OD6.5 MM HIP SELF TAP BONE TRILOGY (Screw) ×1 IMPLANT
SCREW TRILOGY ACETAB 6.5X20MM (Screw) ×1 IMPLANT
SCREW TRILOGY ACETAB 6.5X25MM (Screw) ×1 IMPLANT
SHEET LARGE DRAPE 72X86IN (Drape) ×1
SHELL ACETABULAR OD54 MM HIP LIMIT HOLE (Shell) ×1 IMPLANT
SHELL ACETABULAR OD54 MM HIP LIMIT HOLE OSSEOTI G7 F (Shell) ×1 IMPLANT
SHELL G7 OSSEOTI 4H SZF 54MM (Shell) ×1 IMPLANT
SOLN IRRISEPT WOUND DEBRIDEMNT (Solution)
SPONGE GAUZE L4 IN X W4 IN 16 PLY (Dressing) ×1
SPONGE GAUZE L4 IN X W4 IN 16 PLY MAXIMUM ABSORBENT USP TYPE VII (Dressing) ×1 IMPLANT
SPONGE LAPAROTOMY 18X18IN (Sponge) ×1
SPONGE LAPAROTOMY L18 IN X W18 IN 4 PLY (Sponge) ×1
SPONGE LAPAROTOMY L18 IN X W18 IN 4 PLY RADIOPAQUE PYRONEMA FREE HIGH (Sponge) ×1 IMPLANT
SPONGE SURGICAL L4 IN X W4 IN 16 PLY (Sponge) ×1 IMPLANT
SPONGE VISTEC 16PLY 4X4IN (Sponge) ×1
STEM FEMORAL L145 MM OD13 MM 135 D (Stem) ×1 IMPLANT
STEM FEMORAL L145 MM OD13 MM 135 D STANDARD OFFSET PRESS FIT FULL (Stem) ×1 IMPLANT
SUTURE COATED VICRYL 1 CT-1 L36 IN BRAID (Suture)
SUTURE COATED VICRYL 1 CT-1 L36 IN BRAID COATED VIOLET ABSORBABLE (Suture) IMPLANT
SUTURE COATED VICRYL 2-0 CT-1 L36 IN (Suture)
SUTURE COATED VICRYL 2-0 CT-1 L36 IN BRAID COATED UNDYED ABSORBABLE (Suture) IMPLANT
SUTURE ETHIBOND 1 CT1 30IN (Suture)
SUTURE ETHIBOND EXCEL 1 CT-1 L30 IN (Suture)
SUTURE ETHIBOND EXCEL 1 CT-1 L30 IN BRAID NONABSORBABLE (Suture) IMPLANT
SUTURE ETHILON 3-0 PSL (Suture)
SUTURE ETHILON BLACK 3-0 PSL L30 IN (Suture)
SUTURE ETHILON BLACK 3-0 PSL L30 IN MONOFILAMENT NONABSORBABLE (Suture) IMPLANT
SUTURE VICRYL 1 CT1 27IN (Suture)
SUTURE VICRYL 2-0 CT1 36IN (Suture)
SYRINGE 10 ML GRADUATE NONPYROGENIC DEHP (Syringes, Needles) ×1
SYRINGE 10 ML GRADUATE NONPYROGENIC DEHP FREE PVC FREE LOK MEDICAL (Syringes, Needles) ×1 IMPLANT
SYRINGE LUER LOCK 10CC (Syringes, Needles) ×1
SYSTEM WOUND IRRIGATION DEBRIDEMENT (Solution)
SYSTEM WOUND IRRIGATION DEBRIDEMENT CLEANSING IRRISEPT 0.05% (Solution) IMPLANT
TOOL DISSECTING L9 CM MATCH HEAD FLUTE (Burr)
TOOL DISSECTING L9 CM MATCH HEAD FLUTE OD3 MM MIDAS REX LEGEND (Burr) IMPLANT
TRAY SURGICAL TOTAL JOINT FFX (Pack) ×1 IMPLANT
TRAY TOTAL JOINT FFX (Pack) ×1
TUBE SUCT CRVD MINI 10IN (Suction) ×1
TUBE SUCTION MINI CURVE KAMVAC (Suction) ×1 IMPLANT
WAX BONE 2.5 G NATURAL (Hemostat) IMPLANT
WAX BONE 2.5GM (Hemostat)

## 2019-03-26 NOTE — Brief Op Note (Signed)
BRIEF OP NOTE    Date Time: 03/26/19 11:24 AM    Patient Name:   [name]    Date of Operation:   03/26/2019    Providers Performing:   Surgeon(s):  Estevan Oaks, MD  Carolyn Stare, PA    Assistant (s):   Circulator: Leonard Downing, RN  Scrub Person: Drucilla Chalet    Operative Procedure:   Procedure(s):  ARTHROPLASTY, HIP TOTAL    Preoperative Diagnosis:   Pre-Op Diagnosis Codes:     * Primary osteoarthritis of right hip [M16.11]    Postoperative Diagnosis:   Post-Op Diagnosis Codes:     * Primary osteoarthritis of right hip [M16.11]    Anesthesia:   General    Estimated Blood Loss:    * No values recorded between 03/26/2019  8:34 AM and 03/26/2019 11:11 AM *    Implants:     Implant Name Type Inv. Item Serial No. Manufacturer Lot No. LRB No. Used Action   SCREW TRILOGY ACETAB 6.5X20MM - [id number] Screw SCREW TRILOGY ACETAB 6.5X20MM  [employer] [id number] Right 1 Implanted   SCREW TRILOGY ACETAB 6.5X25MM - [id number] Screw SCREW TRILOGY ACETAB 6.5X25MM  [employer] [id number] Right 1 Implanted   SHELL G7 OSSEOTI 4H SZF - [id number] Shell SHELL G7 OSSEOTI 4H SZF  [employer] [id number] Right 1 Implanted   LINER G7 ACETAB VE HW SZF - [id number] Liner LINER G7 ACETAB VE HW SZF  [employer] [id number] Right 1 Implanted   ECHO POR FMRL NC 13X145 - [id number] Stem ECHO POR FMRL NC 13X145  [employer] [id number] Right 1 Implanted   HEAD MODULAR  NECK - [id number] Head HEAD MODULAR  NECK  [employer] [id number] Right 1 Implanted       Drains:   Drains: no    Specimens:   * No specimens in log *     SPECIMENS (last 24 hours)      Pathology Specimens     Row Name 03/26/19 1000                No Specimen    No Specimen  No Specimen             Findings:   above    Complications:   none      Signed by: Estevan Oaks, MD                                                                           [facility]

## 2019-03-26 NOTE — H&P (Signed)
Pt examined today no significant changes from Preop H+P .   Cor - RR WNL  Chest clear   Abd - Benign   Ext - Severe DJD right hip.  Risks explaineed Especially risks of infection with his history.  Dx - DJD right hip   Plan Right total hip arthroplasty.   Pt understands and el;ects to proceed.

## 2019-03-26 NOTE — H&P (Signed)
I have reviewed the most recent history and physical included on the chart today. I have examined the patient, and there are no significant changes in the past 24 hours.     Heart RRR  Lungs CTA  Abdomen non-tender, normal bowel sounds    Patient is ready for surgery - right total hip replacement.

## 2019-03-26 NOTE — Op Note (Signed)
Procedure Date: 03/26/2019     Patient Type: I     SURGEON: Blanchard Kelch MD  ASSISTANT:  Gaylan Gerold PA     PREOPERATIVE DIAGNOSIS:  End-stage arthritis, right hip.     POSTOPERATIVE DIAGNOSIS:  End-stage arthritis, right hip.     TITLE OF PROCEDURE:  Right total hip arthroplasty.     NOTE:  Prior to the procedure, risks, benefits, alternatives had been discussed  extensively with this patient.  He had a history of methicillin-resistant  Staphylococcus aureus infection in the past and a heavy use of anabolic  steroids.  All of this complicated the risk factors and he understood he  was at higher risk because of this.  He states he has not had any anabolic  steroids for at least a month.  Prior to this discussion today, he had  stated he had been off for quite some time.  The family was counseled as to  the risks involved and use of anabolic steroids.  Apparently, these are  written for him by his internist and endocrinologist, but again it has to  be balanced and his complications are higher.  He understood that.  He had  end-stage arthritis with severe pain, collapse of the right hip, having  failed conservative treatment.  He presents for total hip arthroplasty,  right hip.     Ms. Fredderick Severance assisted through all aspects of procedure.  Help was  absolutely mandatory, greatly appreciated in all phases of the procedure  outlined below.  She helped hold retractors, she helped hold the limb, she  helped with reduction, helped with closure, helped with all aspects of the  procedure outlined below.  Without her help, it would have been impossible  to achieve a successful outcome, absolutely mandatory and greatly  appreciated.     DESCRIPTION OF PROCEDURE:  The patient was placed in the left lateral decubitus position with the  right hip prepped and draped free for a modified anterolateral approach.   Dissection was carried down through skin through fascia and the anterior  aspect of the hip identified.  Stay sutures  of #1 Ethibond suture were used  to secure the anterior one-third to one-half of the gluteus medius,  elevating it off the anterior capsule.  This was resected and marked  thickening and osteophytic changes as well as loose bodies were removed  from the hip.  Once soft tissues which had been nicely cleaned from the  acetabulum, the acetabulum was reamed and broached to accommodate an  appropriate-sized acetabular lining.  A shell was a 54-mm liner with  high-wall liner placed at the 1 o'clock position.  This was secured in  position and checked and noted to be stable.  Prior to placing the poly,  the wound was irrigated with Irrisept.  Two separate screws were placed  into the 11 o'clock and 10 o'clock positions of the cup.  One was 6.5 x 25  mm screw, which was the more cephalad screw and the other screw was 6 x 20,  both had good purchase.  The liner was placed, noted to be stable and felt  to be appropriately aligned.     The femur was reamed and broached ultimately to accommodate a 13 mm x 145  mm stem.  This was a standard stem.  Ultimately, a +6+6 32 mm head was  placed on the neck and noted to be quite appropriate in length and secure  in all planes of  motion.  Trials were placed prior to the final prosthesis  and all seemed to be stable.  Wounds were irrigated copiously multiple  times with Irrisept and irrigation and then closed in layers using #1  figure-of-eight in vertical mattress Ethibond sutures for the gluteus  medius, fascia lata was closed with interrupted and running #1 Ethibond  suture, inverted interrupted  #1 and 2-0 Vicryl were used for subcutaneous  areas and running 3-0 nylon suture for skin.  Dry dressing applied.  The  patient returned to recovery room in stable condition without operative  complication.       Wife was counseled as to the outcome by phone and understands the treatment  plan.  We will monitor him over the evening.  If he does well, we will  probably let him go home in the  morning if all is doing well.  Again, no  complications were encountered and the remainder of findings can also be  seen in the anesthesia records and in the clearance noted on the record.           D:  03/26/2019 11:31 AM by Dr. Blanchard Kelch, MD [phone])  T:  03/26/2019 12:59 PM by NTS      Everlean Cherry: [id number]) (Doc ID: [id number])

## 2019-03-26 NOTE — Transfer of Care (Signed)
Anesthesia Transfer of Care Note    Patient: [name]    Procedures performed: Procedure(s) with comments:  ARTHROPLASTY, HIP TOTAL - RIGHT TOTAL HIP ARTHROPLASTY    Anesthesia type: General ETT    Patient location:Phase I PACU    Last vitals:   Vitals:    03/26/19 1114   BP: 124/57   Pulse: 69   Resp: 14   Temp: 37.8 C (100 F)   SpO2: 100%       Post pain: Patient not complaining of pain, continue current therapy      Mental Status:awake    Respiratory Function: tolerating face mask    Cardiovascular: stable    Nausea/Vomiting: patient not complaining of nausea or vomiting    Hydration Status: adequate    Post assessment: no apparent anesthetic complications, no reportable events and no evidence of recall    Signed by: Margarita Grizzle  03/26/19 11:14 AM
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
## 2019-03-26 NOTE — Anesthesia Postprocedure Evaluation (Signed)
Anesthesia Post Evaluation    Patient: [name]    Procedure(s) with comments:  ARTHROPLASTY, HIP TOTAL - RIGHT TOTAL HIP ARTHROPLASTY    Anesthesia type: general    Last Vitals:   Vitals Value Taken Time   BP 142/70 03/26/19 1350   Temp 37.1 C (98.7 F) 03/26/19 1330   Pulse 85 03/26/19 1400   Resp 12 03/26/19 1330   SpO2 98 % 03/26/19 1350       Anesthesia Post Evaluation:     Patient Evaluated: PACU  Patient Participation: complete - patient participated  Level of Consciousness: awake and alert  Pain Score: 4  Pain Management: adequate    Airway Patency: patent    Anesthetic complications: No      PONV Status: none    Cardiovascular status: acceptable  Respiratory status: acceptable  Hydration status: acceptable        Not Complete    Signed by: Caroleen Hamman, 03/26/2019 2:50 PM

## 2019-03-26 NOTE — Plan of Care (Addendum)
AAOX4; VSS on RA; IV site WNL and infusing;  Ambulates with walker, stand by assist; Continent of Bowel and Bladder. Passing flatus; Right hip dressing C/D/I. Pain controlled with PO meds.   No complaints at this time.  Bed in lowest position, call bell within reach all safety measures in place, will continue to monitor.         Problem: Safety  Goal: Patient will be free from injury during hospitalization  Outcome: Progressing  Goal: Patient will be free from infection during hospitalization  Outcome: Progressing     Problem: Pain  Goal: Pain at adequate level as identified by patient  Outcome: Progressing     Problem: Side Effects from Pain Analgesia  Goal: Patient will experience minimal side effects of analgesic therapy  Outcome: Progressing     Problem: Venous Thrombotic Event  Goal: Free from evidence of bleeding  Outcome: Progressing     Problem: Hip Surgery  Goal: Free from Infection  Outcome: Progressing  Goal: Nutritional Intake is Adequate  Outcome: Progressing  Goal: Neurovascular Status is Stable  Outcome: Progressing  Goal: Hemodynamic Stability  Outcome: Progressing  Goal: Mobility/activity is maintained at optimum level for patient  Outcome: Progressing  Goal: Pain at adequate level as identified by patient  Outcome: Progressing  Goal: Address patient self-management plan  Outcome: Progressing  Goal: Patient/Patient Care Companion demonstrates understanding of disease process, treatment plan, medications, and discharge plan  Outcome: Progressing     Problem: SCIP  Goal: SCIP measures are followed  Outcome: Progressing     Problem: Hemodynamic Status  Goal: Stable vital signs and fluid balance  Outcome: Progressing     Problem: Post Operative Ileus  Goal: Normal Bowel Elimination Pattern is achieved.  Outcome: Progressing     Problem: Inadequate Airway Clearance  Goal: Patent Airway maintained  Outcome: Progressing  Goal: Normal respiratory rate/effort achieved/maintained  Outcome: Progressing      Problem: Infection Prevention  Goal: Free from infection  Outcome: Progressing     Problem: Impaired Mobility  Goal: Mobility/Activity is maintained at optimal level for patient  Outcome: Progressing     Problem: Constipation  Goal: Fluid and electrolyte balance are achieved/maintained  Outcome: Progressing  Goal: Elimination patterns are normal or improving  Outcome: Progressing  Goal: Nutritional intake is adequate  Outcome: Progressing  Goal: Mobility/Activity is maintained at optimal level for patient  Outcome: Progressing     Problem: Knowledge Deficit  Goal: Patient/patient care companion demonstrates understanding of surgery/treatment plan, medications, and discharge plans  Outcome: Progressing

## 2019-03-27 LAB — CBC
Absolute NRBC: 0 10*3/uL (ref 0.00–0.00)
Hematocrit: 39 % (ref 37.6–49.6)
Hgb: 13.4 g/dL (ref 12.5–17.1)
MCH: 34.4 pg — ABNORMAL HIGH (ref 25.1–33.5)
MCHC: 34.4 g/dL (ref 31.5–35.8)
MCV: 100 fL — ABNORMAL HIGH (ref 78.0–96.0)
MPV: 10.8 fL (ref 8.9–12.5)
Nucleated RBC: 0 /100 WBC (ref 0.0–0.0)
Platelets: 309 10*3/uL (ref 142–346)
RBC: 3.9 10*6/uL — ABNORMAL LOW (ref 4.20–5.90)
RDW: 14 % (ref 11–15)
WBC: 11.4 10*3/uL — ABNORMAL HIGH (ref 3.10–9.50)

## 2019-03-27 LAB — BASIC METABOLIC PANEL
Anion Gap: 8 (ref 5.0–15.0)
BUN: 20 mg/dL (ref 9.0–28.0)
CO2: 26 mEq/L (ref 21–29)
Calcium: 8.1 mg/dL — ABNORMAL LOW (ref 8.5–10.5)
Chloride: 104 mEq/L (ref 100–111)
Creatinine: 1.1 mg/dL (ref 0.5–1.5)
Glucose: 100 mg/dL (ref 70–100)
Potassium: 4.1 mEq/L (ref 3.5–5.1)
Sodium: 138 mEq/L (ref 136–145)

## 2019-03-27 LAB — HEMOLYSIS INDEX: Hemolysis Index: 15 (ref 0–18)

## 2019-03-27 LAB — GFR: EGFR: >60

## 2019-03-27 MED ORDER — ACETAMINOPHEN 500 MG PO TABS
1000.00 mg | ORAL_TABLET | Freq: Three times a day (TID) | ORAL | Status: AC
Start: 2019-03-27 — End: ?

## 2019-03-27 MED ORDER — HYDROMORPHONE HCL 2 MG PO TABS
2.00 mg | ORAL_TABLET | ORAL | 0 refills | Status: AC | PRN
Start: 2019-03-27 — End: ?

## 2019-03-27 MED ORDER — ENOXAPARIN SODIUM 40 MG/0.4ML SC SOLN
40.00 mg | SUBCUTANEOUS | 0 refills | Status: AC
Start: 2019-03-28 — End: 2019-04-11

## 2019-03-27 NOTE — Progress Notes (Signed)
Name of DME Agency: [facility] ([phone])  Note: FWW delivered to patients room      

## 2019-03-27 NOTE — UM Notes (Signed)
Place in Outpatient/Ambulatory Status:   03/26/19 1423      PREOPERATIVE DIAGNOSIS:  End-stage arthritis, right hip.     POSTOPERATIVE DIAGNOSIS:  End-stage arthritis, right hip.     TITLE OF PROCEDURE:  Right total hip arthroplasty.       Kendrick Ranch  RN/CM  [facility]  Utilization Review Nurse  Cell # [phone]  Office # [phone]

## 2019-03-27 NOTE — Plan of Care (Signed)
Problem: Safety  Goal: Patient will be free from injury during hospitalization  Outcome: Completed  Goal: Patient will be free from infection during hospitalization  Outcome: Completed     Problem: Pain  Goal: Pain at adequate level as identified by patient  Outcome: Completed     Problem: Side Effects from Pain Analgesia  Goal: Patient will experience minimal side effects of analgesic therapy  Outcome: Completed     Problem: Venous Thrombotic Event  Goal: Free from evidence of bleeding  Outcome: Completed     Problem: Hip Surgery  Goal: Free from Infection  Outcome: Completed  Goal: Nutritional Intake is Adequate  Outcome: Completed  Goal: Neurovascular Status is Stable  Outcome: Completed  Goal: Hemodynamic Stability  Outcome: Completed  Goal: Mobility/activity is maintained at optimum level for patient  Outcome: Completed  Goal: Pain at adequate level as identified by patient  Outcome: Completed  Goal: Address patient self-management plan  Outcome: Completed  Goal: Patient/Patient Care Companion demonstrates understanding of disease process, treatment plan, medications, and discharge plan  Outcome: Completed     Problem: SCIP  Goal: SCIP measures are followed  Outcome: Completed     Problem: Hemodynamic Status  Goal: Stable vital signs and fluid balance  Outcome: Completed     Problem: Post Operative Ileus  Goal: Normal Bowel Elimination Pattern is achieved.  Outcome: Completed     Problem: Inadequate Airway Clearance  Goal: Patent Airway maintained  Outcome: Completed  Goal: Normal respiratory rate/effort achieved/maintained  Outcome: Completed     Problem: Infection Prevention  Goal: Free from infection  Outcome: Completed     Problem: Impaired Mobility  Goal: Mobility/Activity is maintained at optimal level for patient  Outcome: Completed     Problem: Constipation  Goal: Fluid and electrolyte balance are achieved/maintained  Outcome: Completed  Goal: Elimination patterns are normal or improving  Outcome:  Completed  Goal: Nutritional intake is adequate  Outcome: Completed  Goal: Mobility/Activity is maintained at optimal level for patient  Outcome: Completed     Problem: Knowledge Deficit  Goal: Patient/patient care companion demonstrates understanding of surgery/treatment plan, medications, and discharge plans  Outcome: Completed     Problem: Moderate/High Fall Risk Score >5  Goal: Patient will remain free of falls  Outcome: Completed   Given discharged instruction with instructed to patient verbalized understanding. Patient to discharged with assist.

## 2019-03-27 NOTE — Progress Notes (Signed)
Home Health Referral          Referral from Simmie Davies (Case Manager) for home health care upon discharge.    By [employer], the patient has the right to freely choose a home care provider.  Arrangements have been made with:     A company of the patients choosing. We have supplied the patient with a listing of providers in your area who asked to be included and participate in Medicare.   The preferred provider of your insurance company. Choosing a home care provider other than your insurance company's preferred provider may affect your insurance coverage.        Home Health Discharge Information       [employer]:  Name of DME Agency: [facility]]  Equipment Ordered: Agricultural consultant      Home health services were set up by:  Simmie Davies  (Post Acute Care Coordinator for home health)   Phone [phone]    Additional comments: IF YOU HAVE NOT HEARD FROM YOUR HOME YOUR HOME HEALTH AGENCY WITHIN 24-48 HOURS AFTER DISCHARGE PLEASE CALL YOUR AGENCY TO ARRANGE A TIME FOR YOUR FIRST VISIT. FOR ANY SCHEDULING CONCERNS OR QUESTIONS RELATED TO HOME HEALTH, SUCH AS TIME OR DATE PLEASE CONTACT YOUR HOME HEALTH AGENCY AT THE NUMBER LISTED ABOVE.    Signed by: Simmie Davies  Date Time: 03/27/19 12:35 PM      1115  Met with pt to discuss HH recommendations PT states needed Walker but that PT will be provided by MD therapy office   Walker ordered and notified DME tech for delivery

## 2019-03-27 NOTE — PT Progress Note (Signed)
Physical Therapy Note    [facility]   Physical Therapy Treatment  Patient:  [name] MRN#:  [id number]  Unit: [facility] TOWER 8  Bed: F827/F827.01      Post Acute Care Therapy Recommendations:   Discharge Recommendations: Home with supervision, Home with home health PT     Milestones to be reached to achieve recommendation: increased tolerance to fxn mobility  Anticipate achievement in 1-2 sessions    DME Recommended for Discharge: Front wheel walker, Other (Comment)(3 in 1 [facility])    Therapy discharge recommendations may change with patient status.  Please refer to most recent note for up-to-date recommendations.    Assessment:   Significant Findings: none    Pt tolerated session well and is making good progress toward goals.   He was able to complete all therex/hep and showed good understanding of hip precautions. Pt declined stair training, as he does not have any to do at home or in the hotel. Pt would most likely be able to do the stairs as his strength and ROM appears adequate.  R knee still limited in its flexion AROM 2/2 scar tissue (per Pt report).  Pt has adequately met goals in order to [place] from hospital.     Assessment: Decreased LE ROM, Decreased LE strength, Decreased endurance/activity tolerance, Decreased functional mobility, Decreased balance, Gait impairment  Progress: Progressing toward goals  Prognosis: Good, With continued PT status post acute discharge  Risks/Benefits/POC Discussed with Pt/Family: With patient  Patient left without needs and call bell within reach. RN notified of session outcome.     Treatment Activities: gait training, transfer training, therex/hep, [place] planning, bed mobility training, pt education/training.     Educated the patient to role of physical therapy, plan of care, goals of therapy and HEP, safety with mobility and ADLs, hip precautions, weight bearing precautions, discharge instructions, home safety.    Plan:    Treatment/Interventions: Exercise, Gait training, Neuromuscular re-education, Functional transfer training, LE strengthening/ROM, Endurance training, Patient/family training, Equipment eval/education, Bed mobility, Compensatory technique education, Continued evaluation        PT Frequency: 4-5x/wk     Continue plan of care.       Precautions and Contraindications:   Precautions  Weight Bearing Status: RLE WBAT  Precaution Instructions Given to Patient: Yes  Other Precautions: falls, prior TKA, shoulder sxs    Updated Medical Status/Imaging/Labs:   Lab Results   Component Value Date/Time    HGB 13.4 03/27/2019 03:00 AM    HCT 39.0 03/27/2019 03:00 AM    K 4.1 03/27/2019 03:00 AM    NA 138 03/27/2019 03:00 AM     Subjective:   Patient Goal: to get better and [place] tomorrow    Pain Assessment  Pain Assessment: Numeric Scale (0-10)  Pain Score: 4-moderate pain  POSS Score: Awake and Alert  Pain Location: Hip;Leg;Knee  Pain Orientation: Right  Pain Descriptors: Sore  Pain Frequency: Constant/continuous;Increases with movement  Effect of Pain on Daily Activities: moderate  Pain Intervention(s): Medication (See eMAR);Repositioned;Elevated;Rest  Multiple Pain Sites: No    Patient's medical condition is appropriate for Physical Therapy intervention at this time.  Patient is agreeable to participation in the therapy session. Nursing clears patient for therapy.    Objective:   Observation of Patient/Vital Signs:  Patient is seated in a bedside chair with dressings, SCD's and peripheral IV in place.  Pt wore mask during therapy session:Yes    Cognition/Neuro Status  Arousal/Alertness: Appropriate responses  to stimuli  Attention Span: Appears intact  Orientation Level: Oriented X4  Memory: Decreased recall of precautions  Following Commands: Follows multistep commands with increased time  Safety Awareness: minimal verbal instruction  Insights: Fully aware of deficits;Educated in safety awareness  Problem Solving: Able to problem  solve independently  Behavior: calm;attentive;cooperative  Motor Planning: intact  Coordination: intact  Orientation Level: Oriented X4         Functional Mobility:  Supine to Sit: Supervision;Stand by Assist;Increased Time;Increased Effort(using sheet as leg lifter for R LE)  Sit to Supine: Contact Guard Assist;Minimal Assist;to Right(with leg lifter for R LE)  Sit to Stand: Stand by Assist;Supervision  Stand to Sit: Stand by Assist;Supervision  Transfers  Bed to Chair: Supervision;Stand by Assist  Chair to Bed: Supervision;Stand by Assist  Device Used for Functional Transfer: front-wheeled walker    Ambulation:  PMP - Progressive Mobility Protocol   PMP Activity: Step 7 - Walks out of Room  [employer] (ft) (Step 6,7): 200 Feet     Ambulation: Supervision;Stand by Assist;with front-wheeled walker  Pattern: R decreased stance time;decreased step length;decreased cadence  Stair Management: not attempted(pt declined does not have to do stairs at home)    Therapeutic Exercise  Quad Sets: 10  Heelslides: 10  Glute Sets: 10  Hip Abduction: 10  Knee AROM Short Arc Quad: 10  Ankle Pumps: 10               Patient Participation: good  Patient Endurance: fair+/good-    Patient left with call bell within reach, all needs met, SCDs in place, fall mat in place, bed alarm n/a, chair alarm n/a and all questions answered. RN notified of session outcome and patient response.     Goals:  Goals  Goal Formulation: With patient/family(wife present)  Time for Goal Acheivement: 5 visits  Goals: Select goal  Pt Will Go Supine To Sit: modified independent  Pt Will Follow Hip Precautions: modified independent, to prevent hip dislocation, demonstration, Recall 3/3, during ambulation and transfers  Pt Will Transfer Bed/Chair: with rolling walker, modified independent  Pt Will Ambulate: 101-150 feet, with rolling walker, modified independent    PPE worn during session: procedural mask, goggles and gloves    Tech present: none  PPE worn by  tech: N/A    Carron Brazen, DPT  Pager # [phone]      Time of Treatment  PT Received On: 03/27/19  Start Time: 1025  Stop Time: 1110  Time Calculation (min): 45 min  Treatment # 1 out of 5 visits

## 2019-03-27 NOTE — Progress Notes (Addendum)
Post OP Day 1 Day Post-Op for Procedure(s):  ARTHROPLASTY, HIP TOTAL     Patient without significant complaints. Pain reasonably well controlled with scheduled Tylenol and PO Dilaudid PRN. Tolerating PO without N/V. Voiding adequately with no retention. Ambulating successfully yesterday and today with no issues. Patient is from [facility] and will be staying at a nearby hotel until he has recovered further to return home. No home health services required.     Afebrile. WBC normal post-op. H&H stable.    Per his insurance, patient was approved for outpatient/23hour hospital stay.     Vitals:    03/27/19 0838   BP: 133/65   Pulse:    Resp:    Temp:    SpO2:        Intake/Output Summary (Last 24 hours) at 03/27/2019 1055  Last data filed at 03/27/2019 1610  Gross per 24 hour   Intake 1660 ml   Output 3400 ml   Net -1740 ml       Exam:    Awake and alert.  Oriented X 3    Dressing slightly saturated. Dressing changed today. No active drainage or signs of infection.    Calves soft, non tender.  Negative Homans.    Recent Labs   Lab 03/27/19  0300   WBC 11.40*   Hgb 13.4   Hematocrit 39.0       Recent Labs   Lab 03/27/19  0300   Sodium 138   Potassium 4.1   Chloride 104   CO2 26   BUN 20.0   Creatinine 1.1   Glucose 100   Calcium 8.1*           Assessment:    Satisfactory post-op course status post Procedure(s):  ARTHROPLASTY, HIP TOTAL     Plan:    Continue per clinical pathway for Procedure(s):  ARTHROPLASTY, HIP TOTAL     1. Patient stable post op day #1.  2. Continue to monitor pain level.  3. CM to set up DME only. No HHPT/OT needed. Patient will begin outpatient after his first post-operative visit in 2 weeks.  4. Dressing changed today and demonstrated.  5. All post-op and follow-up instructions discussed in detail. Dilaudid and Lovenox sent to hospital pharmacy to be filled. Patient has OTC Tylenol.  6. Patient is safe and medically cleared for discharge today.    D/C HOME WITH SUPERVISION AND DME ONLY

## 2019-03-27 NOTE — Progress Notes (Signed)
Pt afeb VSS NVI, Mobilizing pain controlled. Plan D\C today if all going well .Will be seen by Fredderick Severance today and discharged if stable.

## 2019-03-27 NOTE — Plan of Care (Signed)
[name] is a 59 y.o. male who presents to the hospital on 03/26/2019       Procedure(s) with comments:  ARTHROPLASTY, HIP TOTAL - RIGHT TOTAL HIP ARTHROPLASTY    1 Day Post-Op  -------------------     Most Recent Set of Vitals   Visit Vitals  BP 112/65   Pulse 79   Temp 98.4 F (36.9 C) (Oral)   Resp 17   Ht 1.829 m (6')   Wt 93.5 kg (206 lb 2.1 oz)   SpO2 95%   BMI 27.96 kg/m     Patient alert and oriented x 4, denies SOB and chest pain.  [name] well controlled with dilaudid 4 mg prn. Pt denies numbness and tingling,  pt ambulated with walker and standby assist. Voids spontaneously, has bowel movement. Right hip dressing intact with small drainage, ice bag applied. Pt stable resting well, will continue to monitor.      Intake and Output Summary (Last 24 hours) at Date Time    Intake/Output Summary (Last 24 hours) at 03/27/2019 0325  Last data filed at 03/27/2019 0254  Gross per 24 hour   Intake 2300 ml   Output 2650 ml   Net -350 ml     Problem: Hip Surgery  Goal: Free from Infection  Outcome: Progressing  Flowsheets (Taken 03/27/2019 0323)  Free from infection:   Monitor/assess vital signs   Administer and discontinue antibiotics per SCIP measures   Assess surgical dressing, reinforce or change as needed per order  Goal: Nutritional Intake is Adequate  Outcome: Progressing  Flowsheets (Taken 03/27/2019 0323)  Nutritional intake is adequate:   Assess GI status (bowel sounds, nausea/vomiting, distention, flatus)   Administer stool softener as prescribed   Advance diet as ordered and tolerated  Goal: Neurovascular Status is Stable  Outcome: Progressing  Flowsheets (Taken 03/27/2019 0323)  Neurovascular status is stable:   Assess and document plantar/dorsiflexion   Monitor/assess neurovascular status (pulses, capillary refill, pain, paresthesia, presence of edema)  Goal: Hemodynamic Stability  Outcome: Progressing  Flowsheets (Taken 03/27/2019 0323)  Hemodynamic stability:   Monitor/assess vital signs   Maintain  temperature within desired parameters   Monitor SpO2 and treat as needed  Goal: Mobility/activity is maintained at optimum level for patient  Outcome: Progressing  Flowsheets (Taken 03/27/2019 0323)  Mobility/activity is maintained at optimal level for patient:   Evaluate if patient comfort function goal is met   Out of bed to chair with assistance   Dangle with assistance if indicated   Participate in PT and/or OT plan of care (bed mobility, transfer/gait training, ADL, curb stair training)   Teach/review/reinforce exercises (ankle pumps, quad sets, gluteal sets)   Teach/review/reinforce hip precautions with patient/patient care companion  Goal: Pain at adequate level as identified by patient  Outcome: Progressing  Flowsheets (Taken 03/27/2019 0323)  Pain at adequate level as identified by patient:   Identify patient comfort function goal   Administer oral pain medications as prescribed   Reposition patient every 2 hours and PRN unless able to self-reposition  Goal: Address patient self-management plan  Outcome: Progressing  Goal: Patient/Patient Care Companion demonstrates understanding of disease process, treatment plan, medications, and discharge plan  Outcome: Progressing

## 2019-03-27 NOTE — Discharge Summary (Signed)
Pt a 59 YO W male with progressive AVN and DJD right hip. Long history of using anabolic steroids and previous history of MRSA infection. No obvious active infection and Pt uses anabolics only under doctor supervision by Hx and last used more than 1 month ago. Increased risks explained in detail prior to procedure.   So far doing well. Plan D\C today if all going well.   Final diagnosis - DJD, AVN, Right hip  Post op xrays look good
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
## 2019-04-14 ENCOUNTER — Inpatient Hospital Stay
Payer: No Typology Code available for payment source | Attending: Orthopaedic Surgery | Admitting: Rehabilitative and Restorative Service Providers"

## 2019-04-14 ENCOUNTER — Encounter: Payer: Self-pay | Admitting: Rehabilitative and Restorative Service Providers"

## 2019-04-14 VITALS — BP 123/87 | HR 84

## 2019-04-14 DIAGNOSIS — M25551 Pain in right hip: Secondary | ICD-10-CM | POA: Insufficient documentation
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
## 2019-04-16 ENCOUNTER — Inpatient Hospital Stay: Payer: No Typology Code available for payment source | Attending: Orthopaedic Surgery | Admitting: Physical Therapist

## 2019-04-16 DIAGNOSIS — M25551 Pain in right hip: Secondary | ICD-10-CM | POA: Insufficient documentation

## 2019-04-16 NOTE — PT/OT Therapy Note (Signed)
Name: [name] Age: 59 y.o.   Date of Service: 04/16/2019  Referring Physician: Chinita Greenland II*   Date of Injury: 07/15/2018  Date Care Plan Established/Reviewed: 04/14/2019  Date Treatment Started: 04/14/2019  End of Certification Date: 06/12/2019  Sessions in Plan of Care: 20  Surgery Date: 03/26/2019    Visit Count: 2   Diagnosis:   1. Pain in right hip        Subjective     Pain   Pt feels his scar tissue in his knee has built up a lot. Pt doesn't have a lot of pain but he feels like his stair ascent is really tough. He feels like an old man and like it's r    Social Support/Occupation  Lives in: apartment (Living in [employer])  Lives with: spouse  Occupation: Retired [employer] investment banker, works in Location manager      Precautions: No data was found  Allergies: Patient has no known allergies.     Objective     Range of Motion     04/14/19   Left AROM 04/14/19   Left PROM   Lumbar/Hip 04/14/19   Right AROM 04/14/19   Right PROM       Lumbar Rotation       104  135  Hip Flexion       -2    Hip Extension -15        27  Hip Abduction           Hip Adduction         28  Hip IR         49  Hip ER   24    (blank fields were intentionally left blank)      04/14/19   Left AROM 04/14/19   Left PROM   Knee 04/14/19   Right AROM  04/16/19 04/14/19   Right PROM   135  147  Flexion 50p! 62 55 pain   +5    Extension -2  0    (blank fields were intentionally left blank)        Strength     04/14/19   Left Strength  Hip  MMT 04/14/19   Right   5  Hip Flexion     5-  Hip Extension       Hip Abduction       Hip Adduction     5  Hip IR     5  Hip ER     5  Quadriceps     5-  Hamstrings     (blank fields were intentionally left blank)  NT hip flexion/abd/rotation on R at time of evaluation                Treatment     Therapeutic Exercises - Justified to address any of the following: develop strength, endurance, ROM and/or  flexibility.   Contract relax stretch to quads, 4 minutes, RLE only     Quadruped rocking with specific cues to maintain hip in neutral and to avoid IR, ER, and flexion above 90 degrees    Prolonged holds R knee flexion, progressed to COI in and out of end range with irradiation from contralateral knee flexion.     Manual Therapy - Justified to address any of the following:  Mobilization of joints and soft tissues, manipulation, manual lymphatic drainage, and/or manual traction.    Superficial facia mobilization to anterior knee, medial knee, lateral  knee and posterior knee  Bony contour mobilization to patella, medial joint line and lateral joint line  Soft tissue mobilization to quadriceps, hamstrings, infrapatellar tendon, peripatellar tissues, distal ITB and adductors  Trigger point release to quadriceps  Patellar mobilization superior, inferior and lateral grade III-IV force and with functional movement pattern --knee ext and flexion   IASTM to distal ITB and scar tissue superior to knee  Cupping to distal quad with active flexion and extension        ---      ---   Total Time   Timed Minutes  43 minutes   Total Time  43 minutes        Assessment   As specific protocol limitations have not been set yet therapist focused this session on improving knee ROM to improve mechanics up the chain. Pt with considerable scar tissue restrictions in distal quad which will reduce lengthening of quad and the ability to flex knee with adequate ROM to allow for negotiation of stairs and other obstacles without excessive compensation with hip motion. Pt with modest increase in AROM and decreased pain in R knee by end of session. Therapist provided specific cues to avoid excessive hip motion with quadruped rocking but assigned it as HEP to improve knee ROM to reduce sequelae up the chain due to decreased mobility of the knee. Therapist to follow up on protocol next session after pt follows up with surgeon for stitch removal.    Plan   F/u with pt for protocol   Hip, knee mobility and strength within protocol limits.       Goals    Goal 1: Increase hip flexion AROM > 120 degrees per MD/protocol clearance to be able to don and doff shoes/socks independently.   Sessions: 20      Goal 2: Able to perform single leg balance 10 seconds or > so patient can transfer into/out of shower without fall risk I.   Sessions: 20      Goal 3: Increase hip extension strength 5/5 on MMT B so patient can negotiate 2 flight(s) of stairs reciprocally to access home in [place].   Sessions: 20      Goal 4: Patient will demonstrate independence in prescribed HEP with proper form, sets and reps for safe discharge to an independent program.   Sessions: 20                                Candise Bowens, DPT

## 2019-04-21 ENCOUNTER — Inpatient Hospital Stay
Payer: No Typology Code available for payment source | Attending: Orthopaedic Surgery | Admitting: Rehabilitative and Restorative Service Providers"

## 2019-04-21 DIAGNOSIS — M25551 Pain in right hip: Secondary | ICD-10-CM | POA: Insufficient documentation

## 2019-04-21 NOTE — PT/OT Therapy Note (Signed)
Name: [name] Age: 59 y.o.   Date of Service: 04/21/2019  Referring Physician: Chinita Greenland II*   Date of Injury: 07/15/2018  Date Care Plan Established/Reviewed: 04/14/2019  Date Treatment Started: 04/14/2019  End of Certification Date: 06/12/2019  Sessions in Plan of Care: 20  Surgery Date: 03/26/2019    Visit Count: 3   Diagnosis:   1. Pain in right hip        Subjective     Pain   Pt reports that after the last therapy session, his knee really swelled up for 3-4 days and he used a lot of ice and elevation and it went down. He didn't notice any change in the knee mobility after or since the last therapy session, so he's not sure how much that will do (PT on the knee). He spoke with the MD on 04/18/19 when he got his stitches removed. The MD encouraged 6-8 weeks of restricted motion into flexion (keeping it less than 90 deg) and IR. The MD encouraged pt to "just stretch and walk walk walk". He's been averaging about 3 miles per day. He stiffens up after sitting too long. It feels like a grapefruit on the side of his hip after walking.    Social Support/Occupation  Lives in: apartment (Living in long-term [place])  Lives with: spouse  Occupation: Retired [employer] investment banker, works in Location manager      Precautions: No data was found  Allergies: Patient has no known allergies.     Objective     Range of Motion     04/14/19 L AROM (EVAL) 04/14/19 (EVAL) L PROM   Left PROM   Lumbar/Hip 04/14/19 R AROM (EVAL) 04/14/19 (EVAL) R PROM   Right PROM      Lumbar Rotation      104 135  Hip Flexion      -2   Hip Extension -15      27  Hip Abduction         Hip Adduction       28  Hip IR       49  Hip ER  24    (blank fields were intentionally left blank)      04/14/19 (EVAL) L AROM 04/14/19 (EVAL) L PROM   Left PROM   Knee 04/14/19 (EVAL) R AROM   Right AROM  04/16/19 04/14/19 (EVAL) R PROM   Right PROM   135 147  Flexion 50p! 62 55 pain    +5   Extension -2  0    (blank  fields were intentionally left blank)        Strength     04/14/19 (EVAL) L   Left Strength  Hip  MMT    Right   5  Hip Flexion     5-  Hip Extension       Hip Abduction       Hip Adduction     5  Hip IR     5  Hip ER     5  Quadriceps     5-  Hamstrings     (blank fields were intentionally left blank)  NT hip flexion/abd/rotation on R at time of evaluation (04/14/19)                      Treatment     Therapeutic Exercises - Justified to address any of the following: develop strength, endurance, ROM and/or flexibility.   Modified Maisie Fus  position, R quad/hip flexor stretch  Self-mob with LAX or tennis ball to upper glute vs wall. Cueing to avoid incision.  Review of HEP/self-management including update of HEP for ongoing improvement, review of pre- and post- walk activities for decreased compensation/soreness and improved I.  Discussion of POC including pt move back to [facility].    Neuromuscular Re-Education - Justified to address any of the following: of movement, balance, coordination, kinesthetic sense, posture and/or proprioception for sitting and/or standing activities.   Bridges with therapist A for hip PD for improved stability and motor learning; cueing for core integration with hip extension  Glute isometric activation vs wall, cueing for core integration and improved pelvic stability.         Manual Therapy - Justified to address any of the following:  Mobilization of joints and soft tissues, manipulation, manual lymphatic drainage, and/or manual traction.    Pt in modified Thomas position, to R LE:  Superficial facia mobilization to anterior knee, soft tissue mobilization to quadriceps, peripatellar tissues, distal ITB and adductors  Trigger point release to quadriceps and stick to anterior proximal LE, education provided re. Self-management.  IASTM to distal ITB and scar tissue mobs superior to knee      Home Exercises   Access Code: [email or web address]  URL:  [email or web address]  Date: 04/21/2019  Prepared by: Henrietta Hoover    Exercises  Supine Bridge with Spinal Articulation - 10 reps - 2 sets - 5 sec hold - 2x daily - 7x weekly  Isometric Gluteus Medius at Wall - 10 reps - 2 sets - 5 sec hold - 1-2x daily - 7x weekly       ---      ---   Total Time   Timed Minutes  46 minutes   Total Time  46 minutes        Assessment   Specific restrictions have been established by surgeon and these were reviewed heavily with pt for compliance. Pt provided NMR for improved hip/pelvic stabilization and was encouraged to use such strategies before and during walking for decreased compensation and antalgic gait. Discomfort in outer hip with walking was attributed to TD sign and pt encouraged to use glute isometrics for improved activation and self-mobs to upper glute for recovery from walks daily. Pt planning to move back to home in [place] in 2 weeks. Pt will benefit from skilled physical therapeutic intervention to improve self-management of dysfunction until formal transfer of care to PT clinic in [facility] occurs.  Plan   F/u with pt for protocol   Hip, knee mobility and strength within protocol limits.     HEP focus, pt to complete 3 more sessions (2 weeks) then move to [facility].      Goals    Goal 1: Increase hip flexion AROM > 120 degrees per MD/protocol clearance to be able to don and doff shoes/socks independently.   Sessions: 20      Goal 2: Able to perform single leg balance 10 seconds or > so patient can transfer into/out of shower without fall risk I.   Sessions: 20      Goal 3: Increase hip extension strength 5/5 on MMT B so patient can negotiate 2 flight(s) of stairs reciprocally to access home in [place].   Sessions: 20      Goal 4: Patient will demonstrate independence in prescribed HEP with proper form, sets and reps for safe discharge to an independent program.   Sessions: 20  Loralie Champagne, DPT

## 2019-04-23 ENCOUNTER — Inpatient Hospital Stay: Payer: No Typology Code available for payment source | Attending: Orthopaedic Surgery | Admitting: Physical Therapist

## 2019-04-23 DIAGNOSIS — M25551 Pain in right hip: Secondary | ICD-10-CM | POA: Insufficient documentation

## 2019-04-23 NOTE — Progress Notes (Signed)
Name:[name] Age: 59 y.o.   Date of Service: 04/23/2019  Referring Physician: Chinita Greenland II*   Date of Injury: 07/15/2018  Date Care Plan Established/Reviewed: 04/14/2019  Date Treatment Started: 04/14/2019  End of Certification Date: 06/12/2019  Sessions in Plan of Care: 20  Surgery Date: 03/26/2019      Visit Count: 4   Diagnosis:   1. Pain in right hip      Subjective   Pain   Pt is feeling better since last session. Pt had his stitches out. Pt feels his knee extension and flexion improved slightly. And the pt has been rolling his ITB with the assistance of his wife. Pt has been doing the mobilization every day twice per day and it seems to have helped his mobility. Pt really feels lots of tightness in the calf and the quad. Then he goes on a walk and loosened it up a lot.   Social Support/Occupation   Lives in: apartment (Living in long-term [place])   Lives with: spouse   Occupation: Retired [employer] investment banker, works in Location manager     Precautions: No data was found   Allergies: Patient has no known allergies.   Objective   Range of Motion   04/14/19 L AROM (EVAL) 04/14/19 (EVAL) L PROM Left PROM Lumbar/Hip 04/14/19 R AROM (EVAL) 04/14/19 (EVAL) R PROM Right AROM      Lumbar Rotation      104 135  Hip Flexion   90p!   -2   Hip Extension -15  -22    27  Hip Abduction         Hip Adduction       28  Hip IR       49  Hip ER  24    (blank fields were intentionally left blank)   04/14/19 (EVAL) L AROM 04/14/19 (EVAL) L PROM Left PROM Knee 04/14/19 (EVAL) R AROM Right AROM   04/16/19 R   AROM   04/23/19 04/14/19 (EVAL) R PROM Right PROM   135 147  Flexion 50p! 62 60 55 pain    +5   Extension -2  -10 0    (blank fields were intentionally left blank)   Strength   04/14/19 (EVAL) L Left Strength   Hip   MMT  Right   04/23/19   5  Hip Flexion  4-   5-  Hip Extension       Hip Abduction       Hip Adduction     5  Hip IR     5  Hip ER     5  Quadriceps  5   5-  Hamstrings  5-   (blank fields were intentionally left  blank)  NT hip flexion/abd/rotation on R at time of evaluation (04/14/19)           Treatment   Therapeutic Exercises - Justified to address any of the following: develop strength, endurance, ROM and/or flexibility.   Reassessment of ROM, MMTs, progress towards PLOF and goals.   Therapist demo of self-mobilization with LAX ball. Pt providing return demo.   Wall sits, 4 x 30 second hold   Therapist demo with pt return demo of low load prolonged stretching into knee extension and flexion.   Complete update of HEP   Prone hip extensions, RLE only, 2 x 15 repetitions   Pt education regarding progression of exercises and repetitions with use of loading to increase intensity and improve  efficiency of exercise.   Neuromuscular Re-Education - Justified to address any of the following: of movement, balance, coordination, kinesthetic sense, posture and/or proprioception for sitting and/or standing activities.   Home Exercises   Access Code: [email or web address]   URL: [email or web address]   Date: 04/23/2019   Prepared by: Candise Bowens   Program Notes   Add low-load prolonged stretching to knee flexion and extension (20 mins stretching with weight on the ankle).   Exercises   Supine Bridge with Spinal Articulation - 10 reps - 2 sets - 5 sec hold - 2x daily - 7x weekly   Isometric Gluteus Medius at Wall - 10 reps - 2 sets - 5 sec hold - 1-2x daily - 7x weekly   Wall Sit - 10 reps - 3 sets - 30 hold - 1x daily - 7x weekly   Prone Hip Extension on Table - 10 reps - 3 sets - 1x daily - 7x weekly   Prone Hip Flexor Stretch Off Table with Strap - 2 reps - 3 sets - 30 hold - 1x daily - 7x weekly   Standing Glute Med Mobilization with Small Ball on Wall - 10 reps - 3 sets - 1x daily - 7x weekly            Precautions: No data was found  Allergies: Patient has no known allergies.    Past Medical History:   Diagnosis Date   . At risk for sleep apnea 2018    stop-bang   . Disorder of prostate     Enlarged   . History of anabolic  steroid use     body building    . History of MRSA infection 2015    right shoulder wound-no longer present   . Hypertension 2008    runs 120/70s 128/78   . Malignant tumor of prostate 06/2016    no chemo//no radiation   . MRSA (methicillin resistant staph aureus) culture positive 11/08/2016     Postitive throat culture    . Primary osteoarthritis of right hip                          ---      ---   Total Time   Timed Minutes  42 minutes   Total Time  42 minutes        Assessment   Patient has been seen for 4 visits in this case from 04/14/2019 until 04/23/2019. The encounter diagnosis was Pain in right hip. Patient has made good  progress in pain, range of motion, strength and soft tissue mobility since the initial evaluation. Patient is still demonstrating impairment in pain, posture, joint mobility and soft tissue mobility. Patient has made good progress toward their goals of improved strength but is still limited in AROM due to protocol .  Reviewed a comprehensive discharge home exercise program with the patient today who is in agreement with discharge plan. Patient is ready for discharge from skilled therapy intervention at this time.   Plan   discharge       Goals    Goal 1: Increase hip flexion AROM > 120 degrees per MD/protocol clearance to be able to don and doff shoes/socks independently.    04/23/19: Pt still limited to less than 90 degrees of flexion following    Sessions: 20      Goal 2: Able to perform single leg balance 10 seconds or > so patient can transfer into/out of shower  without fall risk I.   Sessions: 20      Goal 3: Increase hip extension strength 5/5 on MMT B so patient can negotiate 2 flight(s) of stairs reciprocally to access home in [facility].    04/23/19: Pt still lacking hip ext ROM and strength.    Sessions: 20      Goal 4: Patient will demonstrate independence in prescribed HEP with proper form, sets and reps for safe discharge to an independent program.    04/23/19: Pt reports consistent  performance of HEP   Sessions: 20                                Candise Bowens, DPT

## 2019-04-23 NOTE — PT/OT Therapy Note (Signed)
Name: [name] Age: 59 y.o.   Date of Service: 04/23/2019  Referring Physician: Chinita Greenland II*   Date of Injury: 07/15/2018  Date Care Plan Established/Reviewed: 04/14/2019  Date Treatment Started: 04/14/2019  End of Certification Date: 06/12/2019  Sessions in Plan of Care: 20  Surgery Date: 03/26/2019    Visit Count: 4   Diagnosis:   1. Pain in right hip        Subjective     Pain   Pt is feeling better since last session. Pt had his stitches out. Pt feels his knee extension and flexion improved slightly. And the pt has been rolling his ITB with the assistance of his wife. Pt has been doing the mobilization every day twice per day and it seems to have helped his mobility. Pt really feels lots of tightness in the calf and the quad. Then he goes on a walk and loosened it up a lot.     Social Support/Occupation  Lives in: apartment (Living in long-term [place])  Lives with: spouse  Occupation: Retired [employer] investment banker, works in Location manager      Precautions: No data was found  Allergies: Patient has no known allergies.     Objective     Range of Motion     04/14/19 L AROM (EVAL) 04/14/19 (EVAL) L PROM   Left PROM   Lumbar/Hip 04/14/19 R AROM (EVAL) 04/14/19 (EVAL) R PROM   Right AROM      Lumbar Rotation      104 135  Hip Flexion   90p!   -2   Hip Extension -15  -22    27  Hip Abduction         Hip Adduction       28  Hip IR       49  Hip ER  24    (blank fields were intentionally left blank)      04/14/19 (EVAL) L AROM 04/14/19 (EVAL) L PROM   Left PROM   Knee 04/14/19 (EVAL) R AROM   Right AROM  04/16/19 R   AROM  04/23/19 04/14/19 (EVAL) R PROM   Right PROM   135 147  Flexion 50p! 62 60 55 pain    +5   Extension -2  -10 0    (blank fields were intentionally left blank)        Strength     04/14/19 (EVAL) L   Left Strength  Hip  MMT    Right  04/23/19   5  Hip Flexion  4-   5-  Hip Extension       Hip Abduction       Hip Adduction     5   Hip IR     5  Hip ER     5  Quadriceps  5   5-  Hamstrings  5-   (blank fields were intentionally left blank)  NT hip flexion/abd/rotation on R at time of evaluation (04/14/19)                      Treatment     Therapeutic Exercises - Justified to address any of the following: develop strength, endurance, ROM and/or flexibility.   Reassessment of ROM, MMTs, progress towards PLOF and goals.       Therapist demo of self-mobilization with LAX ball. Pt providing return demo.     Wall sits, 4 x 30 second hold    Therapist  demo with pt return demo of low load prolonged stretching into knee extension and flexion.     Complete update of HEP     Prone hip extensions, RLE only, 2 x 15 repetitions    Pt education regarding progression of exercises and repetitions with use of loading to increase intensity and improve efficiency of exercise.     Neuromuscular Re-Education - Justified to address any of the following: of movement, balance, coordination, kinesthetic sense, posture and/or proprioception for sitting and/or standing activities.         Home Exercises   Access Code: [email or web address]  URL: [email or web address]  Date: 04/23/2019  Prepared by: Candise Bowens    Program Notes  Add low-load prolonged stretching to knee flexion and extension (20 mins stretching with weight on the ankle).    Exercises  Supine Bridge with Spinal Articulation - 10 reps - 2 sets - 5 sec hold - 2x daily - 7x weekly  Isometric Gluteus Medius at Wall - 10 reps - 2 sets - 5 sec hold - 1-2x daily - 7x weekly  Wall Sit - 10 reps - 3 sets - 30 hold - 1x daily - 7x weekly  Prone Hip Extension on Table - 10 reps - 3 sets - 1x daily - 7x weekly  Prone Hip Flexor Stretch Off Table with Strap - 2 reps - 3 sets - 30 hold - 1x daily - 7x weekly  Standing Glute Med Mobilization with Small Ball on Wall - 10 reps - 3 sets - 1x daily - 7x weekly       ---      ---   Total Time   Timed Minutes  42 minutes   Total Time  42 minutes        Assessment    Patient has been seen for 4 visits in this case from 04/14/2019 until 04/23/2019. The encounter diagnosis was Pain in right hip. Patient has made good  progress in pain, range of motion, strength and soft tissue mobility since the initial evaluation. Patient is still demonstrating impairment in pain, posture, joint mobility and soft tissue mobility. Patient has made good progress toward their goals of improved strength but is still limited in AROM due to protocol .  Reviewed a comprehensive discharge home exercise program with the patient today who is in agreement with discharge plan. Patient is ready for discharge from skilled therapy intervention at this time.   Plan   discharge      Goals    Goal 1: Increase hip flexion AROM > 120 degrees per MD/protocol clearance to be able to don and doff shoes/socks independently.    04/23/19: Pt still limited to less than 90 degrees of flexion following    Sessions: 20      Goal 2: Able to perform single leg balance 10 seconds or > so patient can transfer into/out of shower without fall risk I.   Sessions: 20      Goal 3: Increase hip extension strength 5/5 on MMT B so patient can negotiate 2 flight(s) of stairs reciprocally to access home in [facility].    04/23/19: Pt still lacking hip ext ROM and strength.    Sessions: 20      Goal 4: Patient will demonstrate independence in prescribed HEP with proper form, sets and reps for safe discharge to an independent program.    04/23/19: Pt reports consistent performance of HEP   Sessions: 20  Candise Bowens, DPT

## 2019-04-28 ENCOUNTER — Inpatient Hospital Stay: Payer: No Typology Code available for payment source | Admitting: Rehabilitative and Restorative Service Providers"

## 2019-04-30 ENCOUNTER — Inpatient Hospital Stay: Payer: No Typology Code available for payment source | Admitting: Physical Therapist

## 2019-05-05 ENCOUNTER — Inpatient Hospital Stay: Payer: No Typology Code available for payment source | Admitting: Rehabilitative and Restorative Service Providers"

## 2019-05-07 ENCOUNTER — Inpatient Hospital Stay: Payer: No Typology Code available for payment source | Admitting: Physical Therapist

## 2019-08-13 ENCOUNTER — Encounter (INDEPENDENT_AMBULATORY_CARE_PROVIDER_SITE_OTHER): Payer: Self-pay

## 2019-08-13 ENCOUNTER — Other Ambulatory Visit (INDEPENDENT_AMBULATORY_CARE_PROVIDER_SITE_OTHER): Payer: Self-pay

## 2019-08-13 DIAGNOSIS — Z136 Encounter for screening for cardiovascular disorders: Secondary | ICD-10-CM

## 2019-08-28 ENCOUNTER — Ambulatory Visit (INDEPENDENT_AMBULATORY_CARE_PROVIDER_SITE_OTHER): Payer: Self-pay

## 2019-08-28 DIAGNOSIS — Z136 Encounter for screening for cardiovascular disorders: Secondary | ICD-10-CM

## 2020-07-06 ENCOUNTER — Other Ambulatory Visit: Payer: Self-pay

## 2020-07-07 ENCOUNTER — Other Ambulatory Visit: Payer: Self-pay

## 2020-10-04 ENCOUNTER — Other Ambulatory Visit (INDEPENDENT_AMBULATORY_CARE_PROVIDER_SITE_OTHER): Payer: Self-pay | Admitting: Urology

## 2020-10-21 ENCOUNTER — Other Ambulatory Visit (INDEPENDENT_AMBULATORY_CARE_PROVIDER_SITE_OTHER): Payer: Self-pay

## 2020-10-21 MED ORDER — BACTERIOSTATIC WATER(BENZ ALC) IJ SOLN
INTRAMUSCULAR | 1 refills | Status: AC
Start: 2020-10-21 — End: ?

## 2020-10-21 NOTE — Telephone Encounter (Signed)
Please Fill compounded RX and send it to Pt pharmacy. Thank you

## 2021-01-10 ENCOUNTER — Other Ambulatory Visit (INDEPENDENT_AMBULATORY_CARE_PROVIDER_SITE_OTHER): Payer: Self-pay | Admitting: Urology

## 2021-04-20 ENCOUNTER — Other Ambulatory Visit (INDEPENDENT_AMBULATORY_CARE_PROVIDER_SITE_OTHER): Payer: Self-pay | Admitting: Urology

## 2021-06-24 ENCOUNTER — Telehealth (INDEPENDENT_AMBULATORY_CARE_PROVIDER_SITE_OTHER): Payer: Self-pay

## 2021-06-24 NOTE — Telephone Encounter (Signed)
[facility] requested refill on Trimix.  Patient has not been seen in the office since 04/2019.  Refill was denied.  The address on file for this patient is in [place].  He needs to see a urologist in [place], if he is living there now. I sent a  message back to the pharmacy that he will need an appointment to get any further refills from our office.

## 2021-06-24 NOTE — Telephone Encounter (Signed)
[name],    If I haven't seen him since 2021, then he should get scripts from [facility]. I thought I saw him since 2021 though.

## 2021-06-24 NOTE — Telephone Encounter (Signed)
JUST FYI---You may get an Electronic Refill request from [facility] on this patient.  You did in March and refilled his Testosterone.  He also had Prostate Cancer and had surgery in 2018.   DLS by you was [phone]. His address is now [place], [place]. I denied his Trimix, but it appears they are still sending you his Testosterone Injections electronically.

## 2022-02-03 IMAGING — MR MRI LUMBAR WITHOUT CONTRAST
2 of 8 series · 5 of 48 positions shown · non-contrast
Comparison: None

MRI LUMBAR WITHOUT CONTRAST , 02/03/2022 [DATE]: 
CLINICAL INDICATION: Lumbar radiculopathy
TECHNIQUE: Multiplanar, multiecho position MR images of the lumbar spine were 
performed without contrast.

[Series 2: T2 · sagittal · 4.0mm · 0.26mm/px · 3 of 20 slices shown (1 of 2)]
[im 1/20]
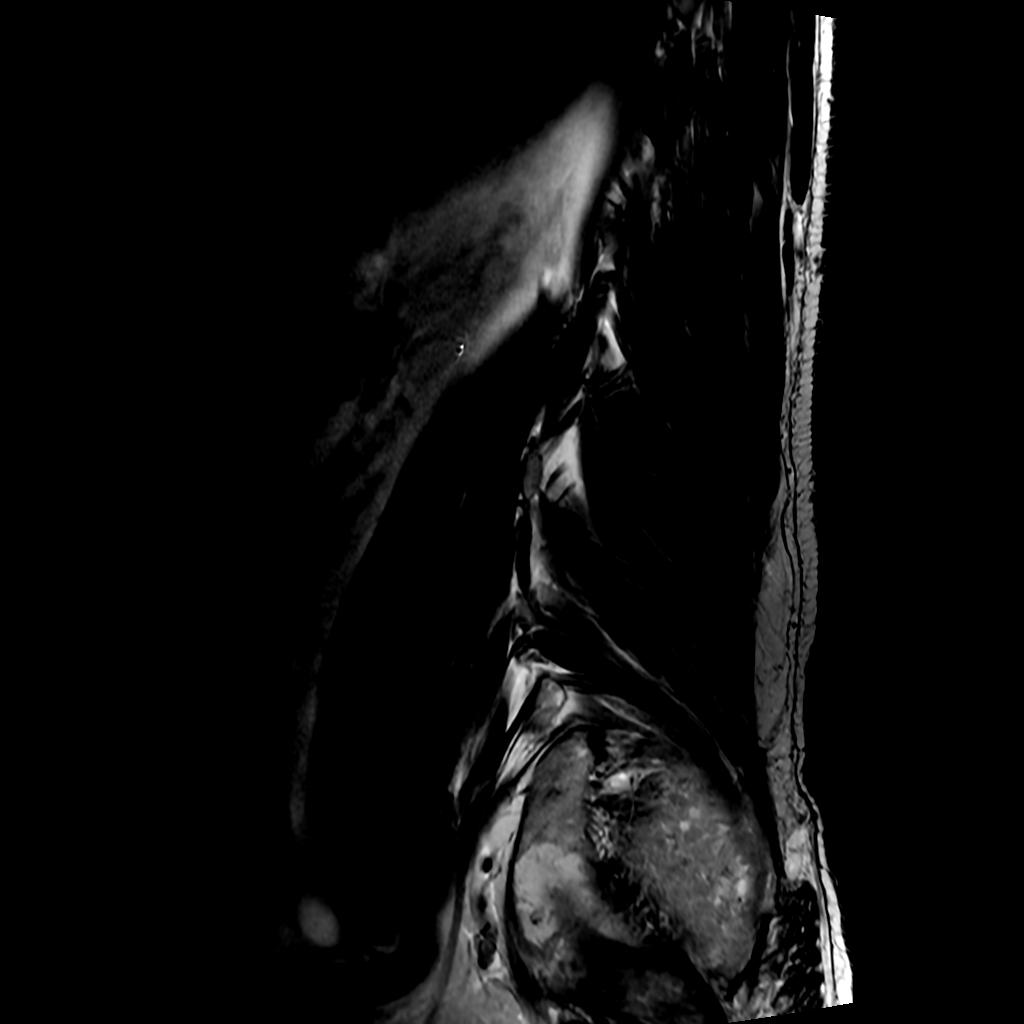
[im 10/20]
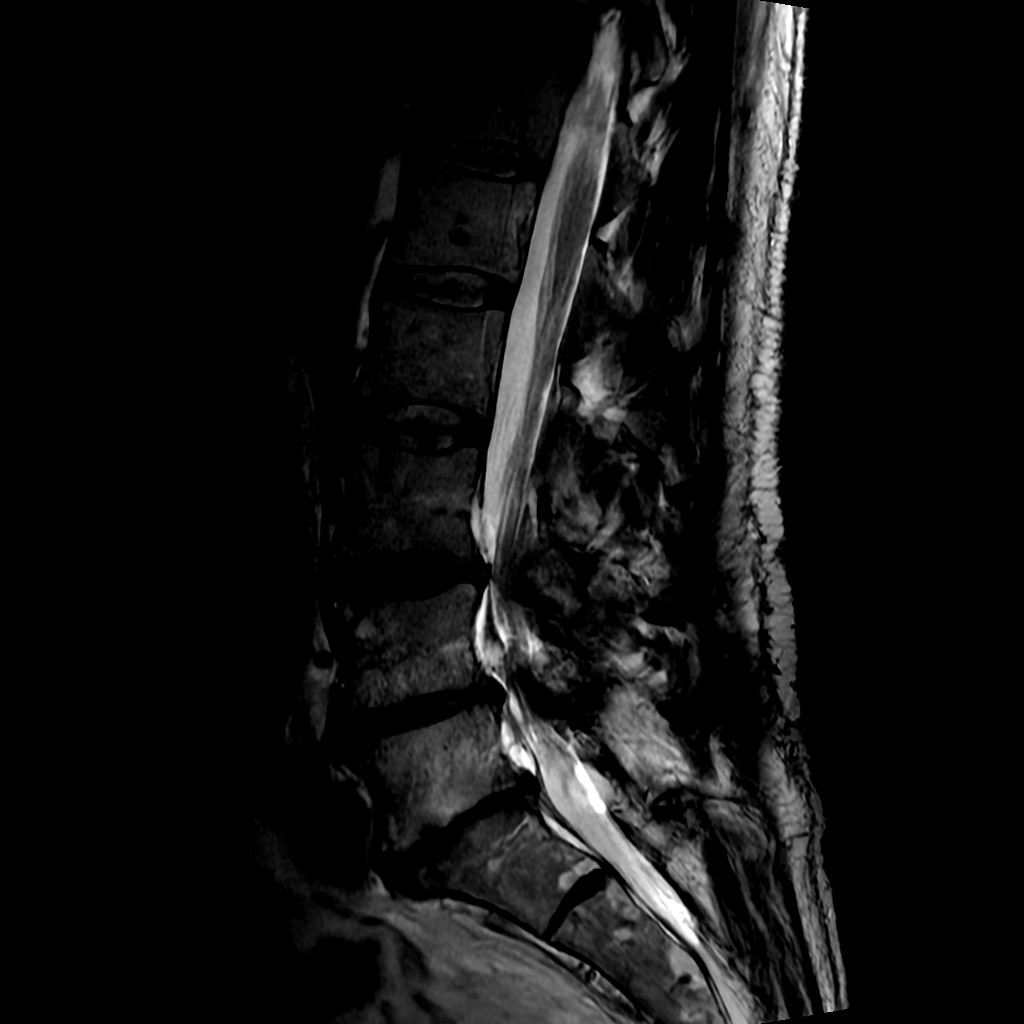
[im 20/20]
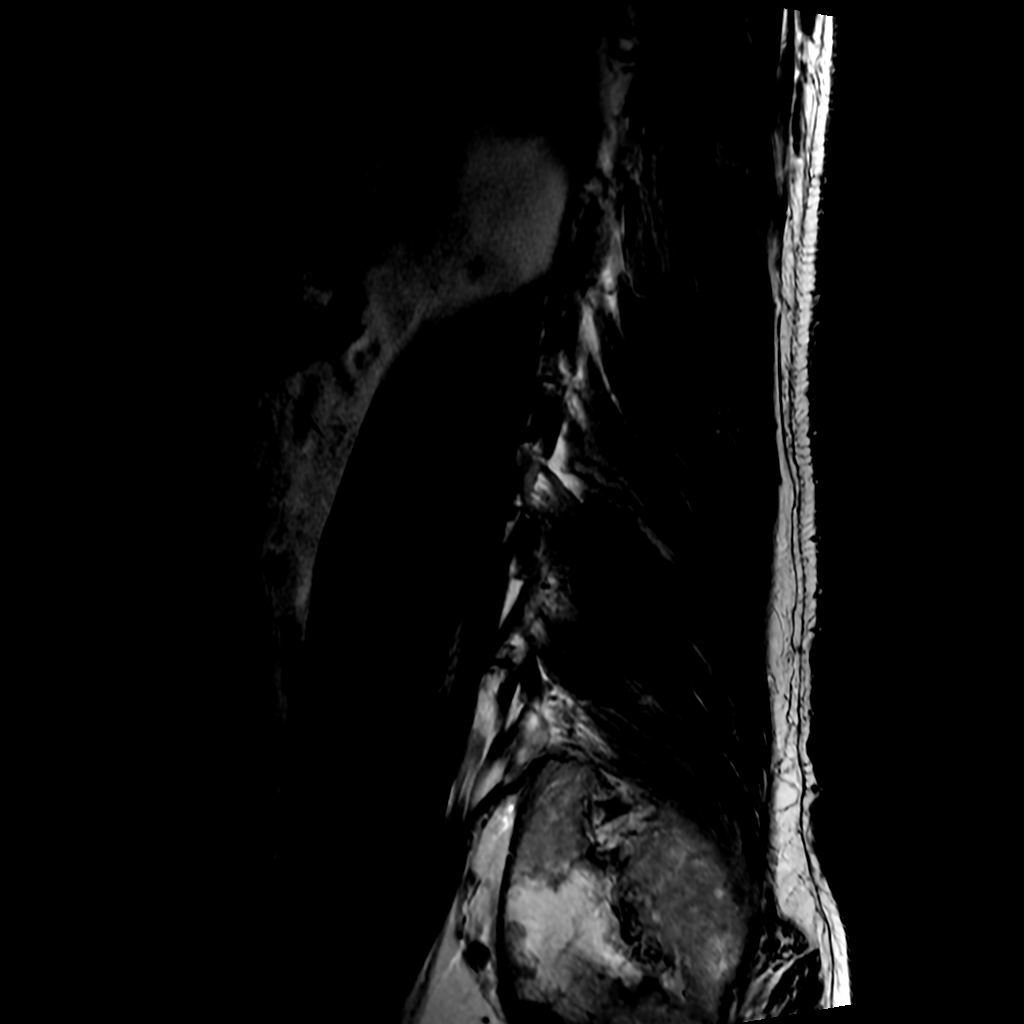

[Series 5: T2 · axial · 4.0mm · 0.18mm/px · z∈[+44,+105]mm · 2 of 33 slices shown (2 of 2)]
[im 5/33]
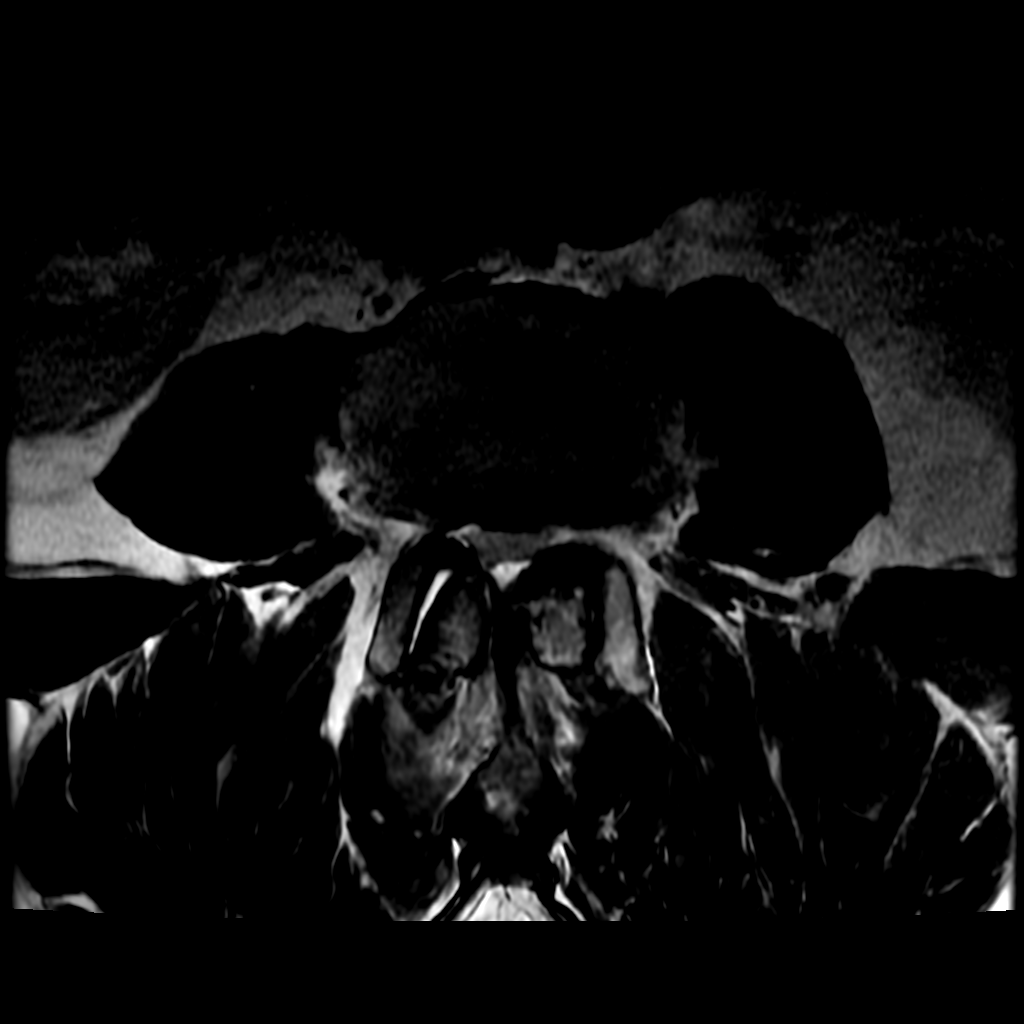
[im 19/33]
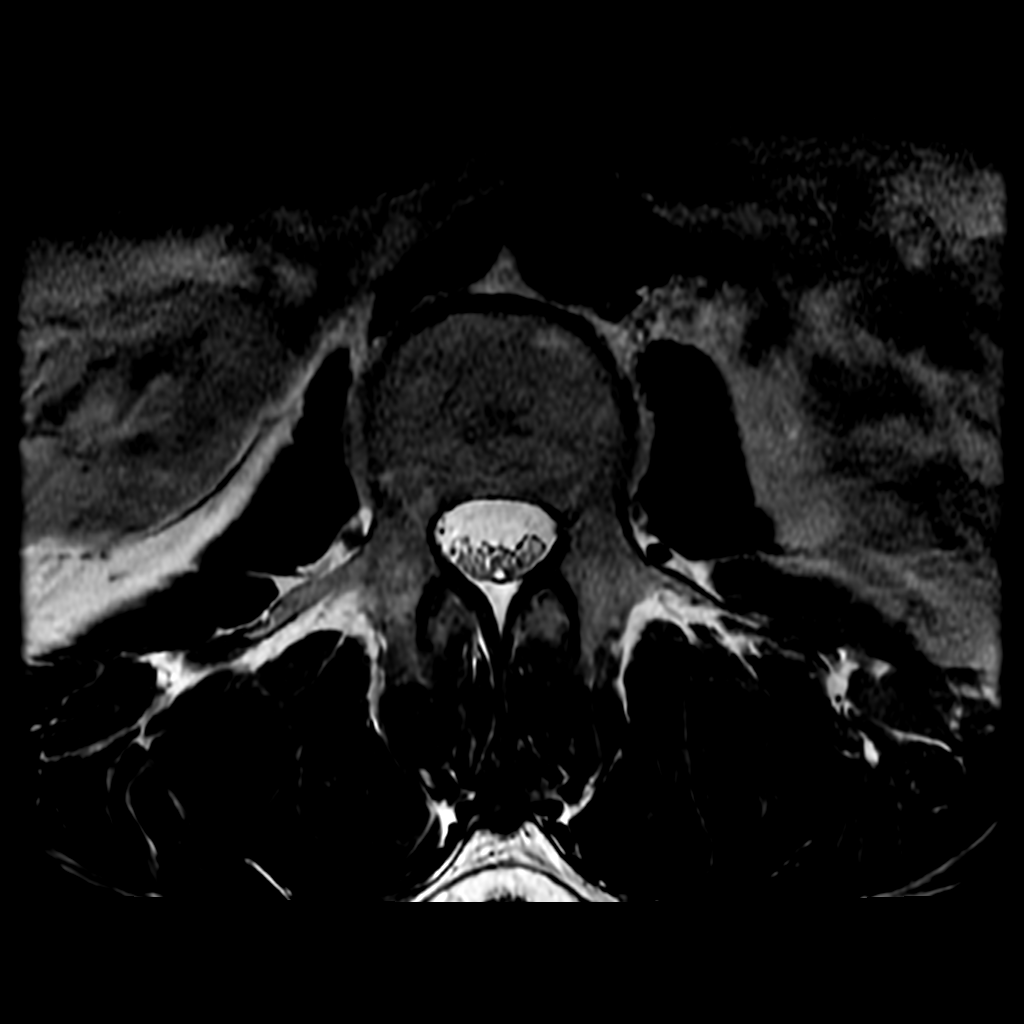

[5 of 48 positions shown; findings below may reference images not displayed]

FINDINGS: Lumbar vertebral heights are intact. There is marked disc narrowing at L5-S1, 
moderate to marked along the right L4-5 interspace. There is reactive 
edema/Modic type I change at L4-5, worse on the right. Chronic endplate changes 
at L5-S1. 
Conus terminates opposite L1. 
On the sagittal STIR image 11 there is a round 19 mm T2 hyperintense signal 
focus in the L5 vertebral segment. This is mildly hypointense on T1-weighted 
images, also hypointense on the long TR/long TE sequence. On the corresponding 
axial T2 image 10 of the lower series this shows signal changes compatible with 
fat depleted hemangioma. There are smaller T1 hyperintense hemangiomas elsewhere 
in the lumbar spine and sacrum. 
At L5-S1 the canal is open. There is moderate to marked facet degenerative 
change. There is moderate right, mild to moderate left foraminal stenosis. 
At L4-5 there is broad-based disc bulge, endplate osteophyte and marked facet 
change with ligamentous thickening. There is epidural lipomatosis, contributing 
to moderate to marked canal stenosis, axial image 12. There is mild bilateral 
foraminal stenosis. 
At L3-4 there is one-3 mm anterolisthesis. There are acquired degenerative 
changes contributing to moderately severe canal stenosis, axial image 19. Mild 
left foraminal stenosis. 
At L2-L3 and L1-2 the canal and foramina are open.
IMPRESSION: Multilevel degenerative changes. There is moderate-marked canal stenosis at 
L4-5, and slightly less pronounced moderate stenosis at L3-4. There is 2 mm 
anterolisthesis at L3-4. 
Marked disc narrowing at L5-S1 with mild to moderate foraminal stenosis. 
19 mm signal abnormality in the L5 vertebral segment is most likely fat depleted 
hemangioma. 
Modic type I changes, most pronounced on the right at L4-5. 
Moderate SI joint degenerative changes.
